# Patient Record
Sex: Female | Born: 1935 | Race: White | Hispanic: No | Marital: Married | Smoking: Never smoker
Health system: Southern US, Community
[De-identification: ages and names within clinical notes are randomized; demographics above are authoritative.]

## PROBLEM LIST (undated history)

## (undated) DIAGNOSIS — I272 Pulmonary hypertension, unspecified: Secondary | ICD-10-CM

## (undated) DIAGNOSIS — E079 Disorder of thyroid, unspecified: Secondary | ICD-10-CM

## (undated) DIAGNOSIS — M1990 Unspecified osteoarthritis, unspecified site: Secondary | ICD-10-CM

## (undated) DIAGNOSIS — M8580 Other specified disorders of bone density and structure, unspecified site: Secondary | ICD-10-CM

## (undated) DIAGNOSIS — I341 Nonrheumatic mitral (valve) prolapse: Secondary | ICD-10-CM

## (undated) DIAGNOSIS — D45 Polycythemia vera: Secondary | ICD-10-CM

## (undated) DIAGNOSIS — I8390 Asymptomatic varicose veins of unspecified lower extremity: Secondary | ICD-10-CM

## (undated) DIAGNOSIS — I10 Essential (primary) hypertension: Secondary | ICD-10-CM

## (undated) HISTORY — DX: Asymptomatic varicose veins of unspecified lower extremity: I83.90

## (undated) HISTORY — DX: Other specified disorders of bone density and structure, unspecified site: M85.80

## (undated) HISTORY — PX: BREAST CYST ASPIRATION: SHX578

## (undated) HISTORY — DX: Disorder of thyroid, unspecified: E07.9

## (undated) HISTORY — PX: EYE SURGERY: SHX253

## (undated) HISTORY — PX: TONSILLECTOMY: SUR1361

---

## 1998-06-24 ENCOUNTER — Other Ambulatory Visit: Admission: RE | Admit: 1998-06-24 | Discharge: 1998-06-24 | Payer: Self-pay | Admitting: Gynecology

## 1999-07-02 ENCOUNTER — Other Ambulatory Visit: Admission: RE | Admit: 1999-07-02 | Discharge: 1999-07-02 | Payer: Self-pay | Admitting: Gynecology

## 1999-12-23 ENCOUNTER — Encounter: Payer: Self-pay | Admitting: Gynecology

## 1999-12-23 ENCOUNTER — Encounter: Admission: RE | Admit: 1999-12-23 | Discharge: 1999-12-23 | Payer: Self-pay | Admitting: Gynecology

## 2000-07-02 ENCOUNTER — Other Ambulatory Visit: Admission: RE | Admit: 2000-07-02 | Discharge: 2000-07-02 | Payer: Self-pay | Admitting: Gynecology

## 2000-07-07 ENCOUNTER — Encounter: Admission: RE | Admit: 2000-07-07 | Discharge: 2000-07-07 | Payer: Self-pay | Admitting: Gynecology

## 2000-07-07 ENCOUNTER — Encounter: Payer: Self-pay | Admitting: Gynecology

## 2000-08-27 ENCOUNTER — Other Ambulatory Visit: Admission: RE | Admit: 2000-08-27 | Discharge: 2000-08-27 | Payer: Self-pay | Admitting: Gynecology

## 2001-01-26 ENCOUNTER — Encounter: Admission: RE | Admit: 2001-01-26 | Discharge: 2001-01-26 | Payer: Self-pay | Admitting: Gynecology

## 2001-01-26 ENCOUNTER — Encounter: Payer: Self-pay | Admitting: Gynecology

## 2001-08-30 ENCOUNTER — Other Ambulatory Visit: Admission: RE | Admit: 2001-08-30 | Discharge: 2001-08-30 | Payer: Self-pay | Admitting: Gynecology

## 2001-11-04 ENCOUNTER — Encounter (INDEPENDENT_AMBULATORY_CARE_PROVIDER_SITE_OTHER): Payer: Self-pay | Admitting: Specialist

## 2001-11-04 ENCOUNTER — Ambulatory Visit (HOSPITAL_COMMUNITY): Admission: RE | Admit: 2001-11-04 | Discharge: 2001-11-04 | Payer: Self-pay | Admitting: Gastroenterology

## 2002-01-30 ENCOUNTER — Encounter: Payer: Self-pay | Admitting: Gynecology

## 2002-01-30 ENCOUNTER — Encounter: Admission: RE | Admit: 2002-01-30 | Discharge: 2002-01-30 | Payer: Self-pay | Admitting: Gynecology

## 2002-09-07 ENCOUNTER — Other Ambulatory Visit: Admission: RE | Admit: 2002-09-07 | Discharge: 2002-09-07 | Payer: Self-pay | Admitting: Gynecology

## 2003-02-01 ENCOUNTER — Encounter: Admission: RE | Admit: 2003-02-01 | Discharge: 2003-02-01 | Payer: Self-pay | Admitting: Gynecology

## 2003-02-01 ENCOUNTER — Encounter: Payer: Self-pay | Admitting: Gynecology

## 2003-06-13 ENCOUNTER — Encounter: Admission: RE | Admit: 2003-06-13 | Discharge: 2003-06-13 | Payer: Self-pay | Admitting: Orthopedic Surgery

## 2003-07-02 ENCOUNTER — Encounter: Admission: RE | Admit: 2003-07-02 | Discharge: 2003-07-02 | Payer: Self-pay | Admitting: Gynecology

## 2004-02-11 ENCOUNTER — Encounter: Admission: RE | Admit: 2004-02-11 | Discharge: 2004-02-11 | Payer: Self-pay | Admitting: Gynecology

## 2004-09-10 ENCOUNTER — Other Ambulatory Visit: Admission: RE | Admit: 2004-09-10 | Discharge: 2004-09-10 | Payer: Self-pay | Admitting: Gynecology

## 2005-02-25 ENCOUNTER — Encounter: Admission: RE | Admit: 2005-02-25 | Discharge: 2005-02-25 | Payer: Self-pay | Admitting: Gynecology

## 2005-03-10 ENCOUNTER — Encounter: Admission: RE | Admit: 2005-03-10 | Discharge: 2005-03-10 | Payer: Self-pay | Admitting: Gynecology

## 2005-09-30 ENCOUNTER — Encounter: Admission: RE | Admit: 2005-09-30 | Discharge: 2005-09-30 | Payer: Self-pay | Admitting: Gynecology

## 2005-10-28 ENCOUNTER — Ambulatory Visit (HOSPITAL_COMMUNITY): Admission: RE | Admit: 2005-10-28 | Discharge: 2005-10-28 | Payer: Self-pay | Admitting: Ophthalmology

## 2006-03-01 ENCOUNTER — Encounter: Admission: RE | Admit: 2006-03-01 | Discharge: 2006-03-01 | Payer: Self-pay | Admitting: Gynecology

## 2006-09-23 ENCOUNTER — Other Ambulatory Visit: Admission: RE | Admit: 2006-09-23 | Discharge: 2006-09-23 | Payer: Self-pay | Admitting: Gynecology

## 2007-03-03 ENCOUNTER — Encounter: Admission: RE | Admit: 2007-03-03 | Discharge: 2007-03-03 | Payer: Self-pay | Admitting: Gynecology

## 2008-03-05 ENCOUNTER — Encounter: Admission: RE | Admit: 2008-03-05 | Discharge: 2008-03-05 | Payer: Self-pay | Admitting: Gynecology

## 2009-03-06 ENCOUNTER — Encounter: Admission: RE | Admit: 2009-03-06 | Discharge: 2009-03-06 | Payer: Self-pay | Admitting: Gynecology

## 2009-03-21 ENCOUNTER — Encounter: Admission: RE | Admit: 2009-03-21 | Discharge: 2009-03-21 | Payer: Self-pay | Admitting: Gynecology

## 2010-03-24 ENCOUNTER — Encounter: Admission: RE | Admit: 2010-03-24 | Discharge: 2010-03-24 | Payer: Self-pay | Admitting: Gynecology

## 2010-05-10 ENCOUNTER — Encounter: Payer: Self-pay | Admitting: Gynecology

## 2010-09-05 NOTE — Op Note (Signed)
[name], [name]               ACCOUNT NO.:  [id number]   MEDICAL RECORD NO.:  [id number]          PATIENT TYPE:  AMB   LOCATION:  SDS                          FACILITY:  [facility]   PHYSICIAN:  Jillyn Hidden A. Rankin, M.D.   DATE OF BIRTH:  [birth year]   DATE OF PROCEDURE:  DATE OF DISCHARGE:                                 OPERATIVE REPORT   PREOPERATIVE DIAGNOSES:  1.  Retained lens fragments, right eye.  2.  Aphakia, right eye.  3.  Microcystic corneal edema.   POSTOPERATIVE DIAGNOSES:  1.  Retained lens fragments, right eye.  2.  Aphakia, right eye.  3.  Microcystic corneal edema.  4.  Retinal hemorrhages, right eye, presumably from recent traumatic      complicated cataract surgery.   PROCEDURE:  1.  Posterior vitrectomy with panretinal photocoagulation, retinopexy 360      degrees, 3 rows, at and anterior to the equator.  2.  Insertion of anterior chamber intraocular lens--primary, rotated in      horizontal position.   LENS USED:  Alcon model [id number], power +18.0, serial [id number], length  12.5 mm, right eye.   SURGEON:  Fawn Kirk, M.D.   ANESTHESIA:  Local with retrobulbar.   INDICATIONS FOR PROCEDURE:  The patient is a 77 year old woman who has found  vision loss on the basis of residual aphakia after a recent complicated  cataract surgery, complicated by retained lens fragments, dispersion of lens  fragments, and rupture of the posterior capsule.  This is an attempt to  restore the pseudophakia condition, but also to remove the lens fragments.  The patient has persistent 1 week of microcystic corneal epithelial edema  and corneal edema.  The patient understands this is an attempt to clear the  vitreous debris, clear the anterior chamber, and place either a posterior or  an anterior chamber intraocular lens to restore visual acuity and function.  She understands the risks of anesthesia including loss of the eye, including  but not limited to hemorrhage, infection,  scarring, need for further  surgery, no change in vision, loss of vision, disease.  Appropriate signed  consent was obtained.   The patient was taken to the operating room.  In the operating room,  appropriate monitoring was followed by mild sedation.  Marcaine 0.75% 5 mL  retrobulbar and an additional 5 mL in the [employer] fashion.  The right  periocular region was sterilely prepped and draped in the usual sterile  fashion.  Lid speculum applied.  Conjunctival peritomy was fashioned  superiorly, and 25-guage trocars were used to place the infusion, and  subsequently the superior trocars were applied.  Core vitrectomy was then  begun.  Microcystic epithelial edema was apparent and hampered  visualization.  Central corneal abrasion was then created so as to allow for  central visualization and peripheral visualization of the entire retina.  This was carried out without difficulty.  Care was taken to confirm that the  posterior hyaloid had been removed.  The vitreous skirt was then trimmed 360  degrees.  Multiple findings of intraretinal hemorrhages, both flame and  dot  and blot, in the midperiphery were noted in the size suggestive of some  traumatic retinal vascular event from this perioperative complication from  the previous cataract surgery.   At this time, then laser photocoagulation was placed anterior to the greater  360 degrees for retinopexy purposes.  At this time, the incision was then  fashioned superiorly.  Under low infusion pressure, the anterior chamber was  deepened with Provisc.  The anterior chamber was then opened.  The  intraocular lens was then rotated, inserting the anterior chamber secured on  a  spur.  The white to white horizontally measured was 11.2 mm.  The 12.5-mm  length was selected.  Excellent fixation was obtained in the horizontal  position with the lens.  A peripheral iridectomy was surgically created  superiorly.  At this time, the anterior chamber was  closed with interrupted  10-0 nylon sutures.  The trocars were removed.  Subconjunctival injection of  Decadron applied.  Intraocular pressure had been assessed and found to be  adequate.  The wound had been found to be secure.  Subconjunctival injection  applied.  Fox shield applied.  The patient was taken to the short stay area.      Alford Highland Rankin, M.D.  Electronically Signed     GAR/MEDQ  D:  10/28/2005  T:  10/29/2005  Job:  [id number]   cc:   Ozzie Hoyle. Vonna Kotyk, MD  [address]  [place], [place] [address]   Allen Norris, M.D.  Fax: [phone]

## 2010-09-05 NOTE — Procedures (Signed)
[facility]  Patient:    [name], [name] Visit Number: [id number] MRN: [id number]          Service Type: END Location: ENDO Attending Physician:  Louie Bun Dictated by:   Everardo All Madilyn Fireman, M.D. Proc. Date: 11/04/01 Admit Date:  11/04/2001 Discharge Date: 11/04/2001   CC:         Gaspar Garbe, M.D.   Procedure Report  PROCEDURE:  Colonoscopy with polypectomy.  INDICATION FOR PROCEDURE:  Family history of colon cancer in a first degree relative.  DESCRIPTION OF PROCEDURE:  The patient was placed in the left lateral decubitus position then placed on the pulse monitor with continuous low flow oxygen delivered by nasal cannula. She was sedated with 70 mg IV Demerol and 6 mg IV Versed. The Olympus video colonoscope was inserted into the rectum and advanced to the cecum, confirmed by transillumination at McBurneys point and visualization of the ileocecal valve and appendiceal orifice. The prep was excellent. The cecum appeared normal. Within the ascending colon, there was an 8 mm sessile polyp which was fulgurated by hot biopsy. The remainder of the ascending colon appeared normal. In the transverse colon, there was a flat 1.2 cm sessile polyp removed by snare and sent in the same container. The remainder of the transverse, descending and sigmoid colon appeared normal with no further polyps, masses, diverticula or other mucosal abnormalities. Within the rectum, there was a 1 cm sessile polyp removed by snare. The scope was then withdrawn and the patient returned to the recovery room in stable condition. The patient tolerated the procedure well and there were no immediate complications.  IMPRESSION:  Ascending, transverse and rectal polyps.  PLAN:  Await histology to determine interval for future colon screening. Dictated by:   Everardo All Madilyn Fireman, M.D. Attending Physician:  Louie Bun DD:  11/04/01 TD:  11/09/01 Job:  [id number]

## 2010-11-10 ENCOUNTER — Other Ambulatory Visit: Payer: Self-pay | Admitting: Gynecology

## 2010-12-04 ENCOUNTER — Encounter: Payer: Self-pay | Admitting: Vascular Surgery

## 2010-12-16 ENCOUNTER — Encounter: Payer: Self-pay | Admitting: Vascular Surgery

## 2010-12-17 ENCOUNTER — Ambulatory Visit (INDEPENDENT_AMBULATORY_CARE_PROVIDER_SITE_OTHER): Payer: Medicare Other | Admitting: Vascular Surgery

## 2010-12-17 ENCOUNTER — Encounter: Payer: Self-pay | Admitting: Vascular Surgery

## 2010-12-17 VITALS — BP 170/71 | HR 75 | Resp 18 | Ht 63.5 in | Wt 109.0 lb

## 2010-12-17 DIAGNOSIS — I83893 Varicose veins of bilateral lower extremities with other complications: Secondary | ICD-10-CM

## 2010-12-17 NOTE — Progress Notes (Signed)
The patient presents today for evaluation of left leg venous hypertension. He has had many year history of progressive discomfort over this. Does have a long history of venous varicosities in her posterior calf but does not have pain over the. She does have some discomfort with prolonged standing mostly onto the dorsum of her feet.[name] He does have  some associated skin breakdown over the dorsum of her ankle. He has attempted where compression in the past but has not had the appropriate type garment. She denies any history of DVT.  Past Medical History  Diagnosis Date  . Varicose veins VENOUS INSUFFICIENCY SLOW HEALING OF WOUNDS [name] TO LOWER EXTEMETIES  . Osteopenia   . Venous stasis dermatitis   . Thyroid disease     History  Substance Use Topics  . Smoking status: Never Smoker   . Smokeless tobacco: Not on file  . Alcohol Use: No    Family History  Problem Relation Age of Onset  . Heart disease Father   . Cancer Son   . Cancer Maternal Aunt   . Cancer Paternal Uncle   . Cancer Maternal Grandfather     Not on File  Current outpatient prescriptions:aspirin 81 MG tablet, Take 81 mg by mouth daily.  , Disp: , Rfl: ;  calcium-vitamin D 250-100 MG-UNIT per tablet, Take 1 tablet by mouth 2 (two) times daily.  , Disp: , Rfl: ;  conjugated estrogens (PREMARIN) vaginal cream, Place vaginally as needed.  , Disp: , Rfl: ;  fish oil-omega-3 fatty acids 1000 MG capsule, Take 2 g by mouth daily.  , Disp: , Rfl:  hydrochlorothiazide (,MICROZIDE/HYDRODIURIL,) 12.5 MG capsule, Take 12.5 mg by mouth daily.  , Disp: , Rfl: ;  loratadine (CLARITIN) 10 MG tablet, Take 10 mg by mouth daily.  , Disp: , Rfl: ;  methimazole (TAPAZOLE) 5 MG tablet, Take 5 mg by mouth 3 (three) times daily.  , Disp: , Rfl: ;  Multiple Vitamin (MULTIVITAMIN) tablet, Take 1 tablet by mouth daily.  , Disp: , Rfl:  risedronate (ACTONEL) 35 MG tablet, Take 35 mg by mouth every 7 (seven) days. with water on empty stomach, nothing  by mouth or lie down for next 30 minutes. , Disp: , Rfl:   BP 170/71  Pulse 75  Resp 18  Ht 5' 3.5" (1.613 m)  Wt 109 lb (49.442 kg)  BMI 19.01 kg/m2  Body mass index is 19.01 kg/(m^2).   Review of systems*of arthritis. Cardiac palpitations. Osteoporosis. Review of systems otherwise negative  Physical exam: Well-developed well-nourished white female no acute distress. HEENT normal. Pulmonary respirations nonlabored cardiovascular 2+ radial 2+ popliteal pulses she has 1+ posterior tibial pulses bilaterally abdomen soft nontender no masses noted. Musculoskeletal: No deformities or cyanosis. Neurologic no focal weakness paresthesias. Skin: Some rash over the left pretibial area.  Sono site screening study revealed some enlargement of her left great saphenous vein. These did extend into the varicosities in her posterior calf.  Assessment: Left leg venous hypertension with varicose veins and [name] skin changes.  Plan: Patient is fitted with knee-high 20-30 mm mercury graduated compression stockings. I did explain treatment options should this fail. I feel that she will have adequate results with compression

## 2010-12-17 NOTE — Patient Instructions (Signed)
Measured for knee high compression stockings 20-30 mmHG.  [name] size medium.  Ankle 22 cm. Calf 34 cm.

## 2011-03-05 ENCOUNTER — Other Ambulatory Visit: Payer: Self-pay | Admitting: Gynecology

## 2011-03-05 DIAGNOSIS — Z1231 Encounter for screening mammogram for malignant neoplasm of breast: Secondary | ICD-10-CM

## 2011-03-31 ENCOUNTER — Ambulatory Visit
Admission: RE | Admit: 2011-03-31 | Discharge: 2011-03-31 | Disposition: A | Discharge status: Home / Self Care | Payer: Medicare Other | Source: Ambulatory Visit | Attending: Gynecology | Admitting: Gynecology

## 2011-03-31 DIAGNOSIS — Z1231 Encounter for screening mammogram for malignant neoplasm of breast: Secondary | ICD-10-CM

## 2012-02-29 ENCOUNTER — Other Ambulatory Visit: Payer: Self-pay | Admitting: Gynecology

## 2012-02-29 DIAGNOSIS — Z1231 Encounter for screening mammogram for malignant neoplasm of breast: Secondary | ICD-10-CM

## 2012-04-06 ENCOUNTER — Ambulatory Visit
Admission: RE | Admit: 2012-04-06 | Discharge: 2012-04-06 | Disposition: A | Discharge status: Home / Self Care | Payer: Medicare Other | Source: Ambulatory Visit | Attending: Gynecology | Admitting: Gynecology

## 2012-04-06 DIAGNOSIS — Z1231 Encounter for screening mammogram for malignant neoplasm of breast: Secondary | ICD-10-CM

## 2012-12-27 ENCOUNTER — Other Ambulatory Visit: Payer: Self-pay | Admitting: Gastroenterology

## 2012-12-27 DIAGNOSIS — Z9889 Other specified postprocedural states: Secondary | ICD-10-CM

## 2012-12-28 ENCOUNTER — Other Ambulatory Visit: Payer: Medicare Other

## 2013-01-30 ENCOUNTER — Other Ambulatory Visit: Payer: Medicare Other

## 2013-02-01 ENCOUNTER — Ambulatory Visit
Admission: RE | Admit: 2013-02-01 | Discharge: 2013-02-01 | Disposition: A | Discharge status: Home / Self Care | Payer: Medicare Other | Source: Ambulatory Visit | Attending: Gastroenterology | Admitting: Gastroenterology

## 2013-02-01 DIAGNOSIS — Z9889 Other specified postprocedural states: Secondary | ICD-10-CM

## 2013-03-23 ENCOUNTER — Other Ambulatory Visit: Payer: Self-pay

## 2013-03-23 DIAGNOSIS — Z1231 Encounter for screening mammogram for malignant neoplasm of breast: Secondary | ICD-10-CM

## 2013-05-01 ENCOUNTER — Ambulatory Visit
Admission: RE | Admit: 2013-05-01 | Discharge: 2013-05-01 | Disposition: A | Discharge status: Home / Self Care | Payer: Medicare Other | Source: Ambulatory Visit

## 2013-05-01 DIAGNOSIS — Z1231 Encounter for screening mammogram for malignant neoplasm of breast: Secondary | ICD-10-CM

## 2014-04-02 ENCOUNTER — Other Ambulatory Visit: Payer: Self-pay

## 2014-04-02 DIAGNOSIS — Z1231 Encounter for screening mammogram for malignant neoplasm of breast: Secondary | ICD-10-CM

## 2014-05-02 ENCOUNTER — Ambulatory Visit
Admission: RE | Admit: 2014-05-02 | Discharge: 2014-05-02 | Disposition: A | Discharge status: Home / Self Care | Payer: Medicare Other | Source: Ambulatory Visit

## 2014-05-02 DIAGNOSIS — Z1231 Encounter for screening mammogram for malignant neoplasm of breast: Secondary | ICD-10-CM

## 2015-04-18 ENCOUNTER — Other Ambulatory Visit: Payer: Self-pay

## 2015-04-18 DIAGNOSIS — Z1231 Encounter for screening mammogram for malignant neoplasm of breast: Secondary | ICD-10-CM

## 2015-05-08 ENCOUNTER — Ambulatory Visit
Admission: RE | Admit: 2015-05-08 | Discharge: 2015-05-08 | Disposition: A | Discharge status: Home / Self Care | Payer: Medicare Other | Source: Ambulatory Visit

## 2015-05-08 DIAGNOSIS — Z1231 Encounter for screening mammogram for malignant neoplasm of breast: Secondary | ICD-10-CM

## 2016-04-21 ENCOUNTER — Other Ambulatory Visit: Payer: Self-pay | Admitting: Internal Medicine

## 2016-04-21 DIAGNOSIS — Z1231 Encounter for screening mammogram for malignant neoplasm of breast: Secondary | ICD-10-CM

## 2016-05-08 ENCOUNTER — Ambulatory Visit
Admission: RE | Admit: 2016-05-08 | Discharge: 2016-05-08 | Disposition: A | Discharge status: Home / Self Care | Payer: Medicare Other | Source: Ambulatory Visit | Attending: Internal Medicine | Admitting: Internal Medicine

## 2016-05-08 DIAGNOSIS — Z1231 Encounter for screening mammogram for malignant neoplasm of breast: Secondary | ICD-10-CM

## 2017-04-26 ENCOUNTER — Other Ambulatory Visit: Payer: Self-pay | Admitting: Internal Medicine

## 2017-04-26 DIAGNOSIS — Z1231 Encounter for screening mammogram for malignant neoplasm of breast: Secondary | ICD-10-CM

## 2017-05-14 ENCOUNTER — Ambulatory Visit
Admission: RE | Admit: 2017-05-14 | Discharge: 2017-05-14 | Disposition: A | Discharge status: Home / Self Care | Payer: Medicare Other | Source: Ambulatory Visit | Attending: Internal Medicine | Admitting: Internal Medicine

## 2017-05-14 DIAGNOSIS — Z1231 Encounter for screening mammogram for malignant neoplasm of breast: Secondary | ICD-10-CM

## 2018-04-21 ENCOUNTER — Other Ambulatory Visit: Payer: Self-pay | Admitting: Internal Medicine

## 2018-04-21 DIAGNOSIS — Z1231 Encounter for screening mammogram for malignant neoplasm of breast: Secondary | ICD-10-CM

## 2018-05-18 ENCOUNTER — Ambulatory Visit
Admission: RE | Admit: 2018-05-18 | Discharge: 2018-05-18 | Disposition: A | Discharge status: Home / Self Care | Payer: Medicare Other | Source: Ambulatory Visit | Attending: Internal Medicine | Admitting: Internal Medicine

## 2018-05-18 DIAGNOSIS — Z1231 Encounter for screening mammogram for malignant neoplasm of breast: Secondary | ICD-10-CM

## 2019-11-03 DIAGNOSIS — N390 Urinary tract infection, site not specified: Secondary | ICD-10-CM | POA: Diagnosis not present

## 2019-11-03 DIAGNOSIS — N3090 Cystitis, unspecified without hematuria: Secondary | ICD-10-CM | POA: Diagnosis not present

## 2019-11-28 DIAGNOSIS — E0520 Thyrotoxicosis with toxic multinodular goiter without thyrotoxic crisis or storm: Secondary | ICD-10-CM | POA: Diagnosis not present

## 2019-11-28 DIAGNOSIS — I129 Hypertensive chronic kidney disease with stage 1 through stage 4 chronic kidney disease, or unspecified chronic kidney disease: Secondary | ICD-10-CM | POA: Diagnosis not present

## 2019-11-28 DIAGNOSIS — M810 Age-related osteoporosis without current pathological fracture: Secondary | ICD-10-CM | POA: Diagnosis not present

## 2019-11-28 DIAGNOSIS — Z0000 Encounter for general adult medical examination without abnormal findings: Secondary | ICD-10-CM | POA: Diagnosis not present

## 2019-12-05 DIAGNOSIS — N1831 Chronic kidney disease, stage 3a: Secondary | ICD-10-CM | POA: Diagnosis not present

## 2019-12-05 DIAGNOSIS — I341 Nonrheumatic mitral (valve) prolapse: Secondary | ICD-10-CM | POA: Diagnosis not present

## 2019-12-05 DIAGNOSIS — I129 Hypertensive chronic kidney disease with stage 1 through stage 4 chronic kidney disease, or unspecified chronic kidney disease: Secondary | ICD-10-CM | POA: Diagnosis not present

## 2019-12-05 DIAGNOSIS — Z1212 Encounter for screening for malignant neoplasm of rectum: Secondary | ICD-10-CM | POA: Diagnosis not present

## 2019-12-05 DIAGNOSIS — F4310 Post-traumatic stress disorder, unspecified: Secondary | ICD-10-CM | POA: Diagnosis not present

## 2019-12-05 DIAGNOSIS — Z0000 Encounter for general adult medical examination without abnormal findings: Secondary | ICD-10-CM | POA: Diagnosis not present

## 2019-12-05 DIAGNOSIS — I272 Pulmonary hypertension, unspecified: Secondary | ICD-10-CM | POA: Diagnosis not present

## 2019-12-05 DIAGNOSIS — J449 Chronic obstructive pulmonary disease, unspecified: Secondary | ICD-10-CM | POA: Diagnosis not present

## 2019-12-05 DIAGNOSIS — E0520 Thyrotoxicosis with toxic multinodular goiter without thyrotoxic crisis or storm: Secondary | ICD-10-CM | POA: Diagnosis not present

## 2019-12-05 DIAGNOSIS — I517 Cardiomegaly: Secondary | ICD-10-CM | POA: Diagnosis not present

## 2019-12-15 DIAGNOSIS — H5212 Myopia, left eye: Secondary | ICD-10-CM | POA: Diagnosis not present

## 2019-12-15 DIAGNOSIS — H52223 Regular astigmatism, bilateral: Secondary | ICD-10-CM | POA: Diagnosis not present

## 2019-12-15 DIAGNOSIS — H1820 Unspecified corneal edema: Secondary | ICD-10-CM | POA: Diagnosis not present

## 2019-12-15 DIAGNOSIS — H18231 Secondary corneal edema, right eye: Secondary | ICD-10-CM | POA: Diagnosis not present

## 2019-12-15 DIAGNOSIS — H524 Presbyopia: Secondary | ICD-10-CM | POA: Diagnosis not present

## 2019-12-15 DIAGNOSIS — H1711 Central corneal opacity, right eye: Secondary | ICD-10-CM | POA: Diagnosis not present

## 2019-12-18 DIAGNOSIS — H52223 Regular astigmatism, bilateral: Secondary | ICD-10-CM | POA: Diagnosis not present

## 2019-12-18 DIAGNOSIS — H16321 Diffuse interstitial keratitis, right eye: Secondary | ICD-10-CM | POA: Diagnosis not present

## 2019-12-18 DIAGNOSIS — H524 Presbyopia: Secondary | ICD-10-CM | POA: Diagnosis not present

## 2019-12-18 DIAGNOSIS — H5212 Myopia, left eye: Secondary | ICD-10-CM | POA: Diagnosis not present

## 2019-12-26 DIAGNOSIS — H1811 Bullous keratopathy, right eye: Secondary | ICD-10-CM | POA: Diagnosis not present

## 2020-01-30 DIAGNOSIS — H5340 Unspecified visual field defects: Secondary | ICD-10-CM | POA: Diagnosis not present

## 2020-01-30 DIAGNOSIS — H40013 Open angle with borderline findings, low risk, bilateral: Secondary | ICD-10-CM | POA: Diagnosis not present

## 2020-01-30 DIAGNOSIS — H401122 Primary open-angle glaucoma, left eye, moderate stage: Secondary | ICD-10-CM | POA: Diagnosis not present

## 2020-01-30 DIAGNOSIS — H52223 Regular astigmatism, bilateral: Secondary | ICD-10-CM | POA: Diagnosis not present

## 2020-01-30 DIAGNOSIS — H40022 Open angle with borderline findings, high risk, left eye: Secondary | ICD-10-CM | POA: Diagnosis not present

## 2020-01-30 DIAGNOSIS — H5212 Myopia, left eye: Secondary | ICD-10-CM | POA: Diagnosis not present

## 2020-01-30 DIAGNOSIS — H524 Presbyopia: Secondary | ICD-10-CM | POA: Diagnosis not present

## 2020-04-14 IMAGING — MG DIGITAL SCREENING BILATERAL MAMMOGRAM WITH CAD
4 series · 4 of 4 positions shown · non-contrast
Comparison: Previous exam(s).

CLINICAL DATA: Screening.

EXAM:
DIGITAL SCREENING BILATERAL MAMMOGRAM WITH CAD

[R MLO]
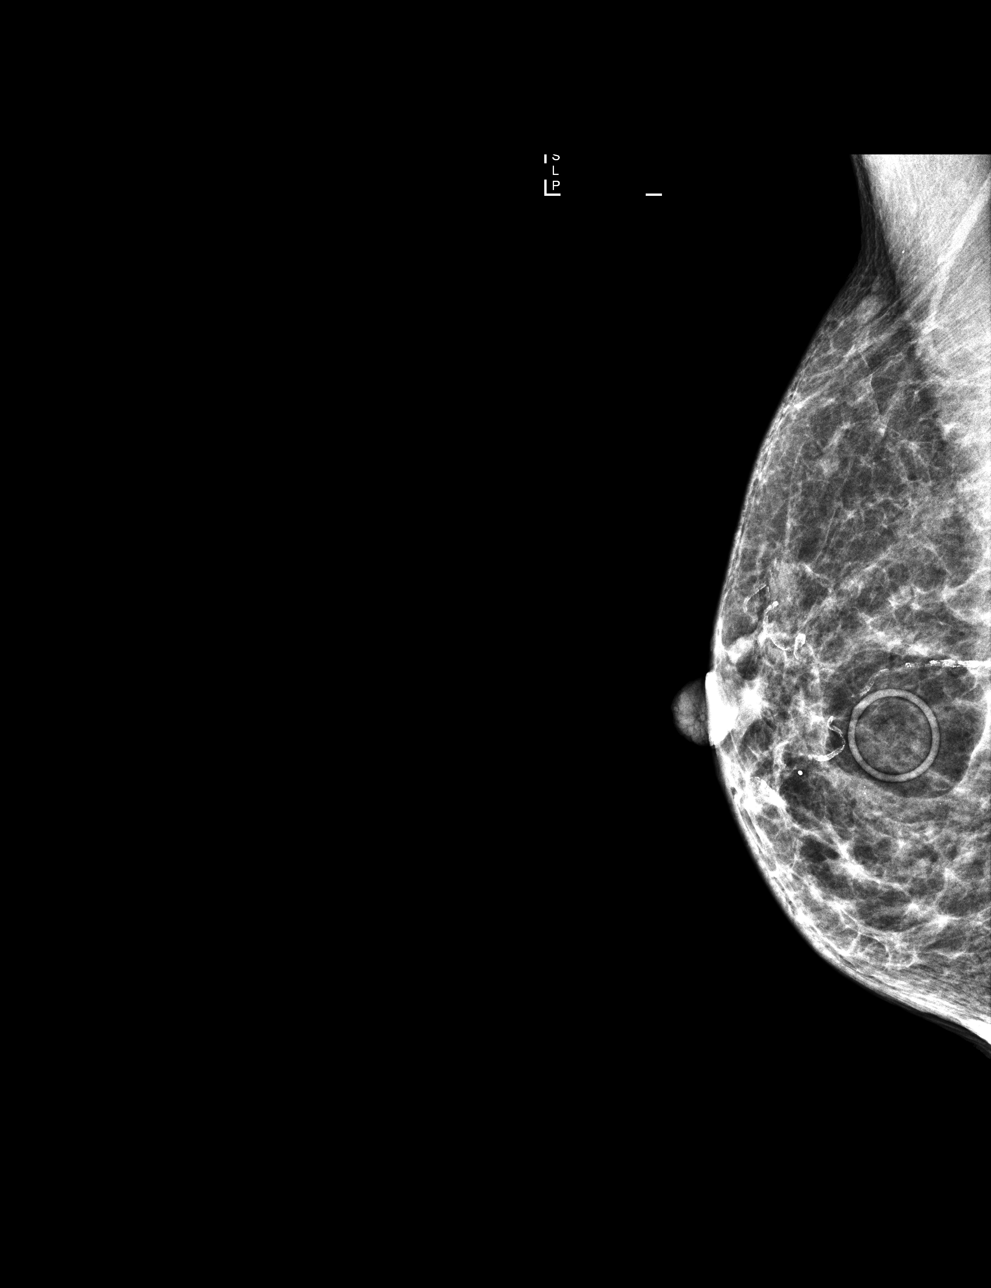

[R CC]
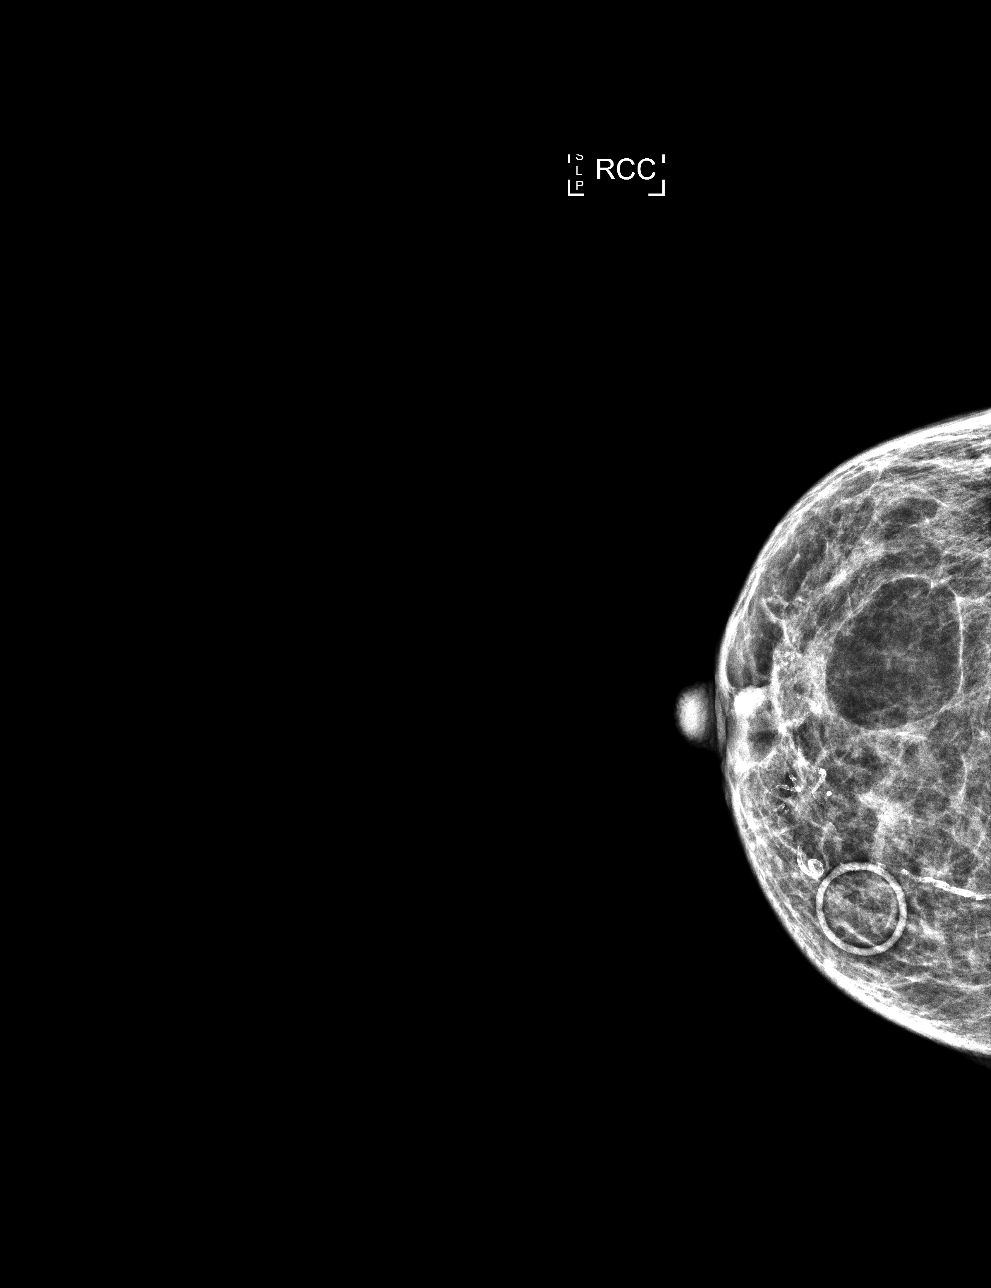

[L MLO]
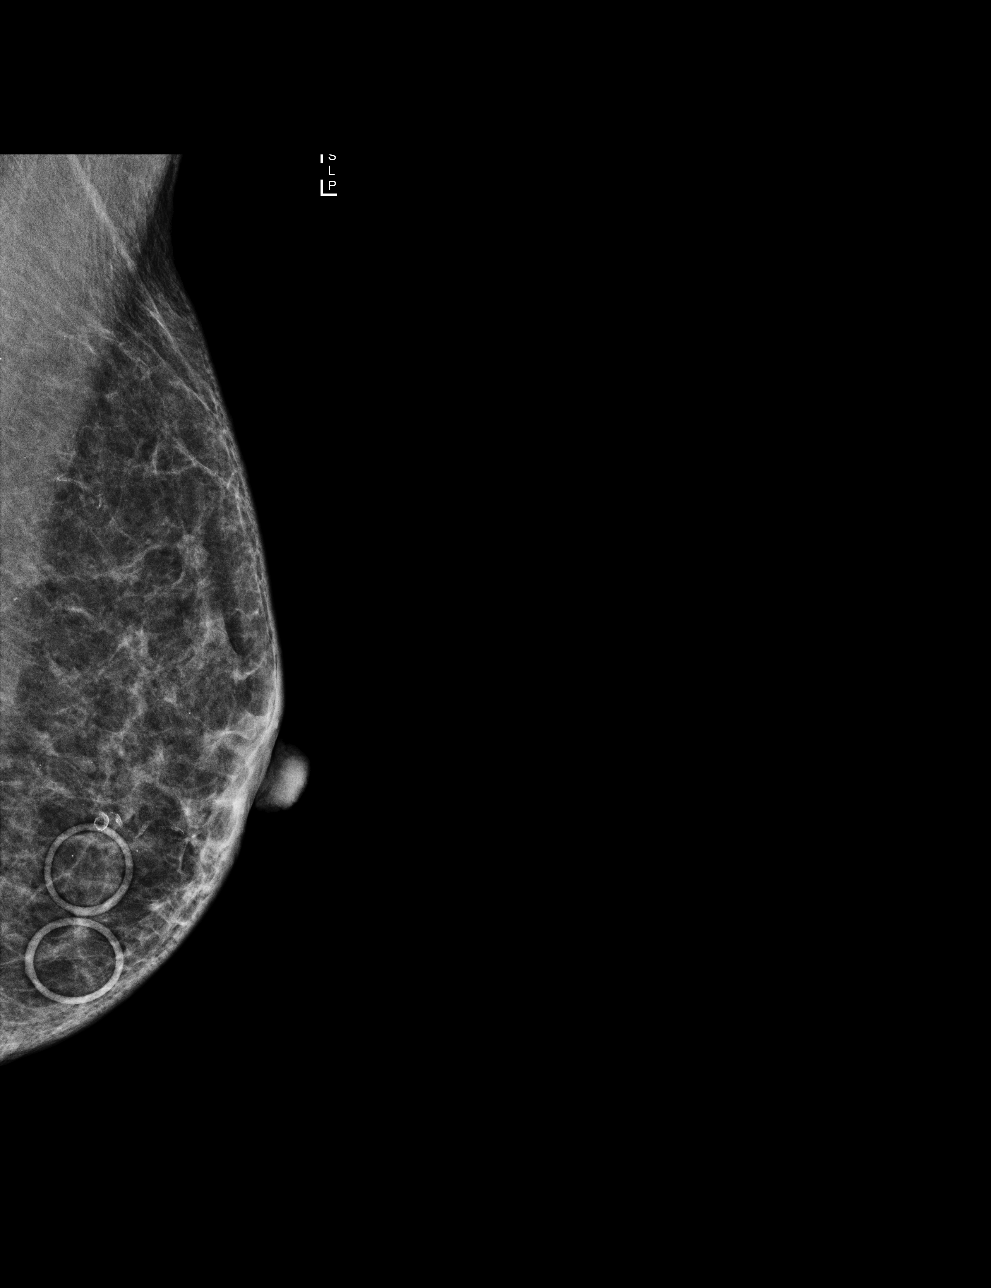

[L CC]
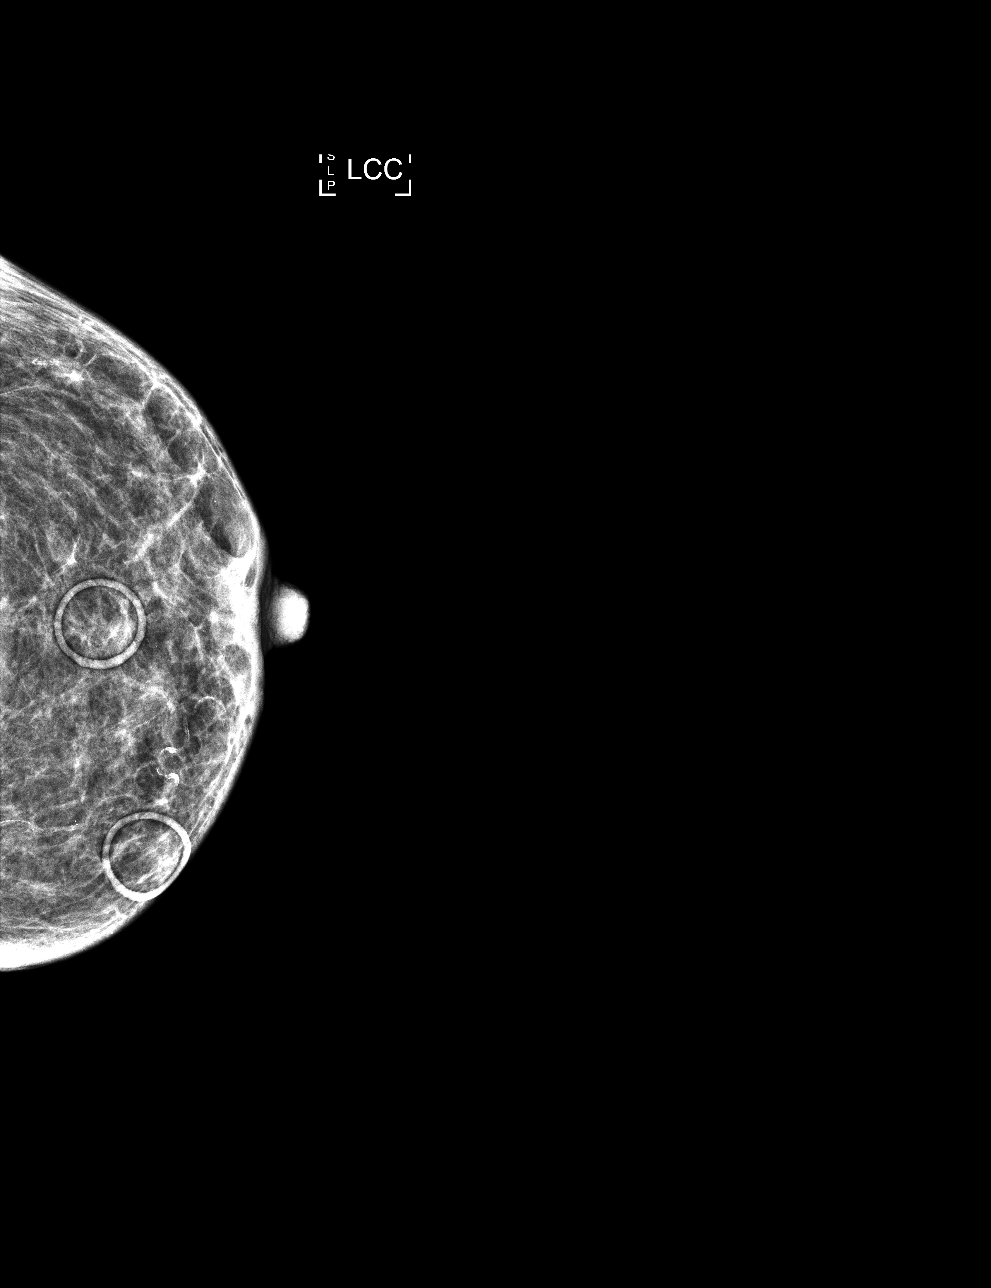

[4 of 4 positions shown; findings below may reference images not displayed]

ACR Breast Density Category b: There are scattered areas of
fibroglandular density.
FINDINGS: There are no findings suspicious for malignancy. Images were
processed with CAD.
IMPRESSION: No mammographic evidence of malignancy. A result letter of this
screening mammogram will be mailed directly to the patient.

RECOMMENDATION:
Screening mammogram in one year. (Code:AS-G-LCT)

BI-RADS CATEGORY  1: Negative.

## 2020-05-14 DIAGNOSIS — L720 Epidermal cyst: Secondary | ICD-10-CM | POA: Diagnosis not present

## 2020-05-30 DIAGNOSIS — H52223 Regular astigmatism, bilateral: Secondary | ICD-10-CM | POA: Diagnosis not present

## 2020-05-30 DIAGNOSIS — H401131 Primary open-angle glaucoma, bilateral, mild stage: Secondary | ICD-10-CM | POA: Diagnosis not present

## 2020-05-30 DIAGNOSIS — H524 Presbyopia: Secondary | ICD-10-CM | POA: Diagnosis not present

## 2020-05-30 DIAGNOSIS — H5212 Myopia, left eye: Secondary | ICD-10-CM | POA: Diagnosis not present

## 2020-06-12 DIAGNOSIS — N1831 Chronic kidney disease, stage 3a: Secondary | ICD-10-CM | POA: Diagnosis not present

## 2020-06-12 DIAGNOSIS — J449 Chronic obstructive pulmonary disease, unspecified: Secondary | ICD-10-CM | POA: Diagnosis not present

## 2020-06-12 DIAGNOSIS — E0520 Thyrotoxicosis with toxic multinodular goiter without thyrotoxic crisis or storm: Secondary | ICD-10-CM | POA: Diagnosis not present

## 2020-06-12 DIAGNOSIS — J302 Other seasonal allergic rhinitis: Secondary | ICD-10-CM | POA: Diagnosis not present

## 2020-06-12 DIAGNOSIS — I272 Pulmonary hypertension, unspecified: Secondary | ICD-10-CM | POA: Diagnosis not present

## 2020-06-12 DIAGNOSIS — H9190 Unspecified hearing loss, unspecified ear: Secondary | ICD-10-CM | POA: Diagnosis not present

## 2020-06-12 DIAGNOSIS — I341 Nonrheumatic mitral (valve) prolapse: Secondary | ICD-10-CM | POA: Diagnosis not present

## 2020-06-12 DIAGNOSIS — I129 Hypertensive chronic kidney disease with stage 1 through stage 4 chronic kidney disease, or unspecified chronic kidney disease: Secondary | ICD-10-CM | POA: Diagnosis not present

## 2020-06-12 DIAGNOSIS — D692 Other nonthrombocytopenic purpura: Secondary | ICD-10-CM | POA: Diagnosis not present

## 2020-06-13 ENCOUNTER — Telehealth: Payer: Medicare Other | Admitting: Hematology

## 2020-06-13 NOTE — Telephone Encounter (Signed)
Received a new hem referral from Dr. Osborne Casco for elevated platelet count. [name] has been cld and scheduled to see Dr. Irene Limbo on 3/7 at 11am.
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
## 2020-06-24 ENCOUNTER — Other Ambulatory Visit: Payer: Self-pay

## 2020-06-24 ENCOUNTER — Inpatient Hospital Stay: Payer: Medicare PPO | Attending: Hematology | Admitting: Hematology

## 2020-06-24 ENCOUNTER — Inpatient Hospital Stay: Payer: Medicare PPO

## 2020-06-24 ENCOUNTER — Telehealth: Payer: Self-pay | Admitting: Hematology

## 2020-06-24 ENCOUNTER — Telehealth: Payer: Self-pay

## 2020-06-24 VITALS — BP 206/87 | HR 67 | Temp 97.7°F | Resp 17 | Ht 63.5 in | Wt 100.1 lb

## 2020-06-24 DIAGNOSIS — M8580 Other specified disorders of bone density and structure, unspecified site: Secondary | ICD-10-CM | POA: Insufficient documentation

## 2020-06-24 DIAGNOSIS — D45 Polycythemia vera: Secondary | ICD-10-CM | POA: Diagnosis not present

## 2020-06-24 DIAGNOSIS — D75839 Thrombocytosis, unspecified: Secondary | ICD-10-CM | POA: Insufficient documentation

## 2020-06-24 DIAGNOSIS — E079 Disorder of thyroid, unspecified: Secondary | ICD-10-CM | POA: Diagnosis not present

## 2020-06-24 LAB — CBC WITH DIFFERENTIAL/PLATELET
Abs Immature Granulocytes: 0.03 10*3/uL (ref 0.00–0.07)
Basophils Absolute: 0.1 10*3/uL (ref 0.0–0.1)
Basophils Relative: 1 %
Eosinophils Absolute: 0.4 10*3/uL (ref 0.0–0.5)
Eosinophils Relative: 4 %
HCT: 50.5 % — ABNORMAL HIGH (ref 36.0–46.0)
Hemoglobin: 16.2 g/dL — ABNORMAL HIGH (ref 12.0–15.0)
Immature Granulocytes: 0 %
Lymphocytes Relative: 14 %
Lymphs Abs: 1.3 10*3/uL (ref 0.7–4.0)
MCH: 31.6 pg (ref 26.0–34.0)
MCHC: 32.1 g/dL (ref 30.0–36.0)
MCV: 98.6 fL (ref 80.0–100.0)
Monocytes Absolute: 0.8 10*3/uL (ref 0.1–1.0)
Monocytes Relative: 8 %
Neutro Abs: 6.5 10*3/uL (ref 1.7–7.7)
Neutrophils Relative %: 73 %
Platelets: 787 10*3/uL — ABNORMAL HIGH (ref 150–400)
RBC: 5.12 MIL/uL — ABNORMAL HIGH (ref 3.87–5.11)
RDW: 14.6 % (ref 11.5–15.5)
WBC: 9 10*3/uL (ref 4.0–10.5)
nRBC: 0 % (ref 0.0–0.2)

## 2020-06-24 LAB — CMP (CANCER CENTER ONLY)
ALT: 25 U/L (ref 0–44)
AST: 32 U/L (ref 15–41)
Albumin: 4.3 g/dL (ref 3.5–5.0)
Alkaline Phosphatase: 77 U/L (ref 38–126)
Anion gap: 7 (ref 5–15)
BUN: 23 mg/dL (ref 8–23)
CO2: 34 mmol/L — ABNORMAL HIGH (ref 22–32)
Calcium: 10 mg/dL (ref 8.9–10.3)
Chloride: 102 mmol/L (ref 98–111)
Creatinine: 1.04 mg/dL — ABNORMAL HIGH (ref 0.44–1.00)
GFR, Estimated: 53 mL/min — ABNORMAL LOW (ref >60)
Glucose, Bld: 86 mg/dL (ref 70–99)
Potassium: 3.6 mmol/L (ref 3.5–5.1)
Sodium: 143 mmol/L (ref 135–145)
Total Bilirubin: 0.7 mg/dL (ref 0.3–1.2)
Total Protein: 7.4 g/dL (ref 6.5–8.1)

## 2020-06-24 LAB — LACTATE DEHYDROGENASE: LDH: 259 U/L — ABNORMAL HIGH (ref 98–192)

## 2020-06-24 LAB — IMMATURE PLATELET FRACTION: Immature Platelet Fraction: 5.1 % (ref 1.2–8.6)

## 2020-06-24 NOTE — Telephone Encounter (Signed)
Scheduled appointment per 3/7 los. Spoke to patient who is aware of appointment date and time.

## 2020-06-24 NOTE — Telephone Encounter (Signed)
[facility] regarding scanned new patient referral for 06/14/20 to be removed as this is not for patient. [facility]S stated a ticket will be opened for IT to remove as they do not do that.

## 2020-06-25 DIAGNOSIS — R002 Palpitations: Secondary | ICD-10-CM | POA: Insufficient documentation

## 2020-06-25 NOTE — Progress Notes (Signed)
Patient referred by Haywood Pao, MD for leg edema  Subjective:   [name], female    DOB: Aug 23, 1932, 87 y.o.   MRN: [id number]   Chief Complaint  Patient presents with  . Hypertension  . Palpitations  . Edema  . New Patient (Initial Visit)    HPI  87 y.o. Caucasian female with hypertension, CKD 3a, COPD, pulmonary hypertension, multinodular goiter w/h/o thyrotoxicosis, h/o mitral valve prolapse, referred for evaluation of palpitations  Patient reports episodes of palpitations, mostly at night. Episodes have increase din frequency. Blood pressure is elevated today, was noted to be 152/80 mmHg at recent PCP visit. She denies chest pain, shortness of breath, palpitations, orthopnea, PND, TIA/syncope. She reports leg edema. She tells me she is known to have PAD, her feet stay cold, but denies any claudication symptoms.  She is seeing hematologist Dr. Irene Limbo for thrombocytosis, has upcoming blood work pending to differentiate between clonal and reactive causes.    Past Medical History:  Diagnosis Date  . Osteopenia   . Thyroid disease   . Varicose veins VENOUS INSUFFICIENCY SLOW HEALING OF WOUNDS Charissa Bash TO LOWER EXTEMETIES  . Venous stasis dermatitis      Past Surgical History:  Procedure Laterality Date  . BREAST CYST ASPIRATION Left   . EYE SURGERY    . TONSILLECTOMY       Social History   Tobacco Use  Smoking Status Never Smoker  Smokeless Tobacco Not on file    Social History   Substance and Sexual Activity  Alcohol Use No     Family History  Problem Relation Age of Onset  . Heart disease Father   . Cancer Son   . Cancer Maternal Aunt   . Cancer Paternal Uncle   . Cancer Maternal Grandfather   . Breast cancer Neg Hx      Current Outpatient Medications on File Prior to Visit  Medication Sig Dispense Refill  . aspirin 81 MG tablet Take 81 mg by mouth daily.    . calcium-vitamin D 250-100 MG-UNIT per tablet Take 1 tablet by mouth 2  (two) times daily.    Marland Kitchen diltiazem (DILACOR XR) 180 MG 24 hr capsule Take 180 mg by mouth daily.    . furosemide (LASIX) 20 MG tablet Take 20 mg by mouth 2 (two) times daily.    Marland Kitchen loratadine (CLARITIN) 10 MG tablet Take 10 mg by mouth daily.    . methimazole (TAPAZOLE) 5 MG tablet Take 5 mg by mouth 3 (three) times daily.    . psyllium (METAMUCIL) 58.6 % packet Take 1 packet by mouth daily.    . timolol (TIMOPTIC) 0.5 % ophthalmic solution Place 1 drop into both eyes daily.     No current facility-administered medications on file prior to visit.    Cardiovascular and other pertinent studies:  EKG 06/26/2020: Sinus rhythm 69 bpm Left atrial enlargement.  Old anteroseptal infarct Probable LVH   Recent labs: 06/12/2020: Glucose 68, BUN/Cr 21/1.0. EGFR 52. Na/K 142/4.3. Rest of the CMP normal H/H 16/49. MCV 96. Platelets 701 HbA1C N/A Lipid panel N/A TSH 4.6 high. Free T4 0.9 normal NT pro BNP 571 normal    Review of Systems  Cardiovascular: Positive for leg swelling. Negative for chest pain, dyspnea on exertion, palpitations and syncope.         Vitals:   06/26/20 0830 06/26/20 0840  BP: (!) 201/77 (!) 190/79  Pulse: 65 67  Resp: 16   Temp: 99 F (  37.2 C)   SpO2: 95%      Body mass index is 19.53 kg/m. Filed Weights   06/26/20 0830  Weight: 100 lb (45.4 kg)     Objective:   Physical Exam Vitals and nursing note reviewed.  Constitutional:      General: She is not in acute distress. Neck:     Vascular: No JVD.  Cardiovascular:     Rate and Rhythm: Normal rate and regular rhythm.     Pulses:          Dorsalis pedis pulses are 1+ on the right side and 1+ on the left side.       Posterior tibial pulses are 0 on the right side and 0 on the left side.     Heart sounds: Murmur heard.  High-pitched blowing holosystolic murmur is present with a grade of 2/6 at the apex.     Comments: Dusky appearance of bilateral feet, good capillary refill Pulmonary:      Effort: Pulmonary effort is normal.     Breath sounds: Normal breath sounds. No wheezing or rales.  Musculoskeletal:     Right lower leg: Edema (Trace) present.     Left lower leg: Edema (Trace) present.         Assessment & Recommendations:   87 y.o. Caucasian female with hypertension, CKD 3a, COPD, pulmonary hypertension, multinodular goiter w/h/o thyrotoxicosis, referred for evaluation of palpitations  Palpitations:  H/o mitral valve prolapse Recommend cardiac telemetry to evaluate for tachyarrhtymia  Leg edema:  Mild. Suspect it could be related to hypertension. Start losartan 50 mg daily. Check BMP, lipid panel in 2 weeks Nonetheless, given h/o mitral valve prolapse will check echocardiogram.   PAD: Abnormal vascular exam, likely disease SFA and below. No claudication symptoms at this time. Continue risk factor modification  Thank you for referring the patient to [place]. Please feel free to contact with any questions.   Nigel Mormon, MD Pager: [phone] Office: [phone]

## 2020-06-26 ENCOUNTER — Encounter: Payer: Self-pay | Admitting: Cardiology

## 2020-06-26 ENCOUNTER — Other Ambulatory Visit: Payer: Self-pay

## 2020-06-26 ENCOUNTER — Inpatient Hospital Stay: Payer: Medicare PPO

## 2020-06-26 ENCOUNTER — Ambulatory Visit: Payer: Medicare PPO | Admitting: Cardiology

## 2020-06-26 VITALS — BP 190/79 | HR 67 | Temp 99.0°F | Resp 16 | Ht 60.0 in | Wt 100.0 lb

## 2020-06-26 DIAGNOSIS — R002 Palpitations: Secondary | ICD-10-CM | POA: Diagnosis not present

## 2020-06-26 DIAGNOSIS — R0989 Other specified symptoms and signs involving the circulatory and respiratory systems: Secondary | ICD-10-CM | POA: Diagnosis not present

## 2020-06-26 DIAGNOSIS — I10 Essential (primary) hypertension: Secondary | ICD-10-CM | POA: Insufficient documentation

## 2020-06-26 MED ORDER — LOSARTAN POTASSIUM 50 MG PO TABS: 50.0000 mg | ORAL_TABLET | Freq: Every day | ORAL | 3 refills | Status: DC

## 2020-06-28 ENCOUNTER — Telehealth: Payer: Self-pay | Admitting: Hematology

## 2020-06-28 NOTE — Telephone Encounter (Signed)
Scheduled per 03/07 los, patient has been called and notified of upcoming phone visit.

## 2020-07-01 LAB — JAK2 (INCLUDING V617F AND EXON 12), MPL,& CALR-NEXT GEN SEQ

## 2020-07-03 DIAGNOSIS — R0989 Other specified symptoms and signs involving the circulatory and respiratory systems: Secondary | ICD-10-CM | POA: Diagnosis not present

## 2020-07-04 LAB — LIPID PANEL
Chol/HDL Ratio: 2 ratio (ref 0.0–4.4)
Cholesterol, Total: 149 mg/dL (ref 100–199)
HDL: 74 mg/dL (ref >39)
LDL Chol Calc (NIH): 63 mg/dL (ref 0–99)
Triglycerides: 60 mg/dL (ref 0–149)
VLDL Cholesterol Cal: 12 mg/dL (ref 5–40)

## 2020-07-07 NOTE — Progress Notes (Signed)
HEMATOLOGY/ONCOLOGY CONSULTATION NOTE  Date of Service: 07/08/2020  Patient Care Team: [name], Fransico Him, MD as PCP - General (Internal Medicine)  CHIEF COMPLAINTS/PURPOSE OF CONSULTATION:  Thrombocytosis  HISTORY OF PRESENTING ILLNESS:   [name] is a wonderful 87 y.o. female who has been referred to [place] by Dr. Domenick Gong, MD for evaluation and management of elevated Plt count. The pt reports that she is doing well overall. We are joined today by her husband, [name].  The pt had labwork, 06/12/2020 of CBC had a Plt of 701K.   The pt reports that this is the first time she has noticed her elevated Plt count. She notes her Plt have been increasing since 2018, but were normal. She notes they were slightly elevated in 2020 and have been the highest before when she got labs done recently last month. The pt notes that she did experience a severe injury and bruising one year ago in an accident with a wood splitter. There has not been any other recent injuries or infections. The pt denies any recent reasons for bleeding. The pt notes post-nasal drip, a minor cough, and a toxic goiter. She notes she has been the healthiest and had the least infection issues since COVID and the use of masks. The pt notes there were no blood clots with her injury from an [place] on 07/06/2019, and the bruising and swelling has improved on surface level. She notes intermittent sting an pain in the leg.  The pt denies any history of smoking. She also denies any new symptoms that are acute. The pt notes she has not been feeling her old baseline, but notes no issues acutely or unexplained changes in how she has felt. The pt is eating well. The pt denies any history of blood clots, heart attacks, strokes. The pt notes she has an appointment with a Cardiologist this week in two days. She notes a weak beat and some irregular heartbeats. She has been more aware of her heartbeats recently and has been swelling in her feet  more. The pt notes she also has Pulmonary Hypertension, but was never aware of the cause of this. The pt still has her spleen. The pt notes some new crippling in her left hand and continued in her right. The pt notes some hip pain and denies any history of gout. The pt notes her last thyroid levels were checked on 06/12/2020 with other blood counts and there were no concerns.   On review of systems, pt reports leg swelling and denies fevers, chills, sudden unexpected weight loss, SOB, chest pain, back pain, changes in bowel habits, and any other symptoms.   INTERVAL HISTORY  I connected with [employer] on 07/08/2020 by telephone and verified that I am speaking with the correct person using two identifiers.   I discussed the limitations of evaluation and management by telemedicine. The patient expressed understanding and agreed to proceed.   Other persons participating in the visit and their role in the encounter:                                                         - Reinaldo Raddle, Medical Scribe            - Mr. Setterlund, Pt's Husband     Patient's location:  Home Provider's location: [name] is a wonderful 87 y.o. female who is here today for evaluation and management of thrombocytosis. The patient's last visit with [place] was on 06/24/2020. The pt reports that she is doing well overall.  The pt reports no new symptoms or concerns. The pt denies any history of blood clots or DVT. The pt notes that she has started on Losartan by her Cardiologist.   Lab results 06/24/2020 of CBC w/diff and CMP is as follows: all values are WNL except for RBC of 5.12, Hgb of 16.2, HCT of 50.5, Plt of 787K, CO2 of 34, Creatinine of 1.04, GFR est of 53. 06/24/2020 Immature Plt Fract of 5.1. 06/24/2020 LDH of 259. 06/24/2020 JAK2 mutation detected.  On review of systems, pt denies any acute symptoms.  MEDICAL HISTORY:  Past Medical History:  Diagnosis Date  . Osteopenia   .  Thyroid disease   . Varicose veins VENOUS INSUFFICIENCY SLOW HEALING OF WOUNDS Charissa Bash TO LOWER EXTEMETIES  . Venous stasis dermatitis     SURGICAL HISTORY: Past Surgical History:  Procedure Laterality Date  . BREAST CYST ASPIRATION Left   . EYE SURGERY    . TONSILLECTOMY      SOCIAL HISTORY: Social History   Socioeconomic History  . Marital status: Married    Spouse name: Not on file  . Number of children: 2  . Years of education: Not on file  . Highest education level: Not on file  Occupational History  . Not on file  Tobacco Use  . Smoking status: Never Smoker  . Smokeless tobacco: Never Used  Vaping Use  . Vaping Use: Never used  Substance and Sexual Activity  . Alcohol use: No  . Drug use: Never  . Sexual activity: Not on file  Other Topics Concern  . Not on file  Social History Narrative  . Not on file   Social Determinants of Health   Financial Resource Strain: Not on file  Food Insecurity: Not on file  Transportation Needs: Not on file  Physical Activity: Not on file  Stress: Not on file  Social Connections: Not on file  Intimate Partner Violence: Not on file    FAMILY HISTORY: Family History  Problem Relation Age of Onset  . Heart disease Father   . Cancer Son   . Cancer Maternal Aunt   . Cancer Paternal Uncle   . Cancer Maternal Grandfather   . Breast cancer Neg Hx     ALLERGIES:  is allergic to doxycycline calcium and penicillin v.  MEDICATIONS:  Current Outpatient Medications  Medication Sig Dispense Refill  . hydroxyurea (HYDREA) 500 MG capsule Take 1 capsule (500 mg total) by mouth daily. May take with food to minimize GI side effects. 30 capsule 1  . aspirin 81 MG tablet Take 81 mg by mouth daily.    . calcium-vitamin D 250-100 MG-UNIT per tablet Take 1 tablet by mouth 2 (two) times daily.    Marland Kitchen diltiazem (DILACOR XR) 180 MG 24 hr capsule Take 180 mg by mouth daily.    . furosemide (LASIX) 20 MG tablet Take 20 mg by mouth 2 (two)  times daily.    Marland Kitchen loratadine (CLARITIN) 10 MG tablet Take 10 mg by mouth daily.    Marland Kitchen losartan (COZAAR) 50 MG tablet Take 1 tablet (50 mg total) by mouth daily. 30 tablet 3  . methimazole (TAPAZOLE) 5 MG tablet Take 5 mg by mouth 3 (three) times daily.    Marland Kitchen  psyllium (METAMUCIL) 58.6 % packet Take 1 packet by mouth daily.    . timolol (TIMOPTIC) 0.5 % ophthalmic solution Place 1 drop into both eyes daily.     No current facility-administered medications for this visit.    REVIEW OF SYSTEMS:   10 Point review of Systems was done is negative except as noted above.  PHYSICAL EXAMINATION: ECOG PERFORMANCE STATUS: 1 - Symptomatic but completely ambulatory  . There were no vitals filed for this visit. There were no vitals filed for this visit. .There is no height or weight on file to calculate BMI.  Telehealth Visit.  LABORATORY DATA:  I have reviewed the data as listed  . CBC Latest Ref Rng & Units 06/24/2020  WBC 4.0 - 10.5 K/uL 9.0  Hemoglobin 12.0 - 15.0 g/dL 16.2(H)  Hematocrit 36.0 - 46.0 % 50.5(H)  Platelets 150 - 400 K/uL 787(H)    . CMP Latest Ref Rng & Units 06/24/2020  Glucose 70 - 99 mg/dL 86  BUN 8 - 23 mg/dL 23  Creatinine 0.44 - 1.00 mg/dL 1.04(H)  Sodium 135 - 145 mmol/L 143  Potassium 3.5 - 5.1 mmol/L 3.6  Chloride 98 - 111 mmol/L 102  CO2 22 - 32 mmol/L 34(H)  Calcium 8.9 - 10.3 mg/dL 10.0  Total Protein 6.5 - 8.1 g/dL 7.4  Total Bilirubin 0.3 - 1.2 mg/dL 0.7  Alkaline Phos 38 - 126 U/L 77  AST 15 - 41 U/L 32  ALT 0 - 44 U/L 25     RADIOGRAPHIC STUDIES: I have personally reviewed the radiological images as listed and agreed with the findings in the report. No results found.  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 LDH borderline elevated. -Advised pt she does have the JAK2  positive mutation as seen in Myeloproliferative Neoplasm. Advised pt that most likely explanation is Polycythemia Vera based on clinical evaluation.  -Advised pt that Bm Bx is the only way we can see which MPN this is. JAK2 also associated with Myelofibrosis. Can clinically treat as Polycythemia Vera at this time if Bm Bx not desired. The pt does not wish to get the Bm Bx at this time. -Discussed typical treatment of normalizing the Plt and increased RBC-- Hydroxyurea. Want to control the increased Plt due to risk of blood clots, heart attacks, and strokes. -Advised pt that elevated LDH suggests against tissue scarring. -Discussed regiment of therapeutic phlebotomies to reduce RBC if needed. -Will send referral for counseling for medication. -Rx Hydroxyurea to [facility] for first time. -Continue ASA. -Will see back in 3 weeks for toxicity check for Hydroxyurea.    FOLLOW UP: RTC with Dr Irene Limbo with labs in 3 weeks   Orders Placed This Encounter  Procedures  . CBC with Differential/Platelet    Standing Status:   Future    Standing Expiration Date:   07/08/2021  . CMP ([place] only)    Standing Status:   Future    Standing Expiration Date:   07/08/2021  . Lactate dehydrogenase    Standing Status:   Future    Standing Expiration Date:   07/08/2021    All of the patients questions were answered with apparent satisfaction. The patient knows to call the clinic with any problems, questions or concerns.   The total time spent in the appointment was  30 minutes and more than 50% was on counseling and direct patient cares.     Sullivan Lone MD [place] AAHIVMS [facility] Hematology/Oncology Physician [facility]  (Office):       [phone] (Work cell):  [phone] (Fax):           [phone]  07/08/2020 3:29 PM  I, Reinaldo Raddle, am acting as scribe for Dr. Sullivan Lone, MD.   .I have reviewed the above documentation for accuracy and completeness, and I agree with  the above. Brunetta Genera MD

## 2020-07-08 ENCOUNTER — Inpatient Hospital Stay (HOSPITAL_BASED_OUTPATIENT_CLINIC_OR_DEPARTMENT_OTHER): Payer: Medicare PPO | Admitting: Hematology

## 2020-07-08 ENCOUNTER — Other Ambulatory Visit: Payer: Self-pay | Admitting: Hematology

## 2020-07-08 ENCOUNTER — Telehealth: Payer: Self-pay | Admitting: Pharmacist

## 2020-07-08 DIAGNOSIS — D471 Chronic myeloproliferative disease: Secondary | ICD-10-CM | POA: Diagnosis not present

## 2020-07-08 DIAGNOSIS — E079 Disorder of thyroid, unspecified: Secondary | ICD-10-CM | POA: Diagnosis not present

## 2020-07-08 DIAGNOSIS — D75839 Thrombocytosis, unspecified: Secondary | ICD-10-CM | POA: Diagnosis not present

## 2020-07-08 DIAGNOSIS — M8580 Other specified disorders of bone density and structure, unspecified site: Secondary | ICD-10-CM | POA: Diagnosis not present

## 2020-07-08 DIAGNOSIS — Z1589 Genetic susceptibility to other disease: Secondary | ICD-10-CM | POA: Diagnosis not present

## 2020-07-08 MED ORDER — HYDROXYUREA 500 MG PO CAPS: 500.0000 mg | ORAL_CAPSULE | Freq: Every day | ORAL | 1 refills | Status: DC

## 2020-07-08 MED FILL — HYDROXYUREA 500 MG CAPSULE: 500 | 30 days supply | Qty: 30 | Fill #0

## 2020-07-09 DIAGNOSIS — R002 Palpitations: Secondary | ICD-10-CM | POA: Diagnosis not present

## 2020-07-09 NOTE — Telephone Encounter (Signed)
Oral Chemotherapy Pharmacist Encounter   I spoke with patient for overview of new medication: Hydroxyurea for the treatment of JAK2 positive MPN, planned duration until disease progression or unacceptable drug toxicity, goal of maintaining platelet count < 400 K/uL.   Counseled on administration, dosing, side effects, monitoring, drug-food interactions, safe handling, storage, and disposal.   Patient will take Hydroxyurea 500 mg capsule, 1 capsule (500 mg total) by mouth daily. May take with food to minimize GI side effects.   Start date: 07/09/20   Side effects include but not limited to: decreased blood counts, headache, rash.     Reviewed importance of keeping a medication schedule and plan for any missed doses.   After discussion no patient barriers to medication adherence identified.    Patient voiced understanding and appreciation. All questions answered.   Medication education handout placed in mail for patient. Provided Oral Chemotherapy Navigation Clinic phone number. Patient knows to call the office with questions or concerns.    Leron Croak, PharmD, BCPS Hematology/Oncology Clinical Pharmacist [facility] [phone] 07/09/2020 10:58 AM

## 2020-07-09 NOTE — Telephone Encounter (Signed)
Oral Chemotherapy Pharmacist Encounter   Attempted to reach patient to provide update and offer for initial counseling on oral medication: Hydrea (hydroxyurea).   No answer. Left voicemail for patient to call back to discuss details of medication acquisition and initial counseling session.  Leron Croak, PharmD, BCPS Hematology/Oncology Clinical Pharmacist [facility] [phone] 07/09/2020 9:57 AM

## 2020-07-10 ENCOUNTER — Other Ambulatory Visit: Payer: Self-pay

## 2020-07-10 ENCOUNTER — Telehealth: Payer: Self-pay | Admitting: Hematology

## 2020-07-10 DIAGNOSIS — I10 Essential (primary) hypertension: Secondary | ICD-10-CM

## 2020-07-10 NOTE — Telephone Encounter (Signed)
Scheduled follow-up appointment per 3/21 los. Patient is aware. ?

## 2020-07-11 ENCOUNTER — Ambulatory Visit: Payer: Medicare PPO

## 2020-07-11 ENCOUNTER — Other Ambulatory Visit: Payer: Self-pay

## 2020-07-11 DIAGNOSIS — I10 Essential (primary) hypertension: Secondary | ICD-10-CM | POA: Diagnosis not present

## 2020-07-11 DIAGNOSIS — R0989 Other specified symptoms and signs involving the circulatory and respiratory systems: Secondary | ICD-10-CM | POA: Diagnosis not present

## 2020-07-16 ENCOUNTER — Telehealth: Payer: Self-pay | Admitting: *Deleted

## 2020-07-18 NOTE — Telephone Encounter (Signed)
Opened in error

## 2020-07-19 DIAGNOSIS — R002 Palpitations: Secondary | ICD-10-CM | POA: Diagnosis not present

## 2020-07-22 ENCOUNTER — Other Ambulatory Visit: Payer: Self-pay

## 2020-07-22 ENCOUNTER — Ambulatory Visit: Payer: Medicare PPO | Admitting: Cardiology

## 2020-07-22 ENCOUNTER — Encounter: Payer: Self-pay | Admitting: Cardiology

## 2020-07-22 VITALS — BP 167/67 | HR 66 | Temp 97.7°F | Ht 60.0 in | Wt 101.6 lb

## 2020-07-22 DIAGNOSIS — I10 Essential (primary) hypertension: Secondary | ICD-10-CM

## 2020-07-22 DIAGNOSIS — I2729 Other secondary pulmonary hypertension: Secondary | ICD-10-CM | POA: Insufficient documentation

## 2020-07-22 DIAGNOSIS — I272 Pulmonary hypertension, unspecified: Secondary | ICD-10-CM | POA: Insufficient documentation

## 2020-07-22 DIAGNOSIS — R0989 Other specified symptoms and signs involving the circulatory and respiratory systems: Secondary | ICD-10-CM | POA: Diagnosis not present

## 2020-07-22 DIAGNOSIS — I491 Atrial premature depolarization: Secondary | ICD-10-CM | POA: Diagnosis not present

## 2020-07-22 MED ORDER — LOSARTAN POTASSIUM 100 MG PO TABS
100.0000 mg | ORAL_TABLET | Freq: Every day | ORAL | 3 refills | Status: DC
Start: 2020-07-22 — End: 2021-07-18

## 2020-07-22 NOTE — Progress Notes (Signed)
Patient referred by Haywood Pao, MD for leg edema  Subjective:   [name], female    DOB: Nov 21, 1932, 87 y.o.   MRN: [id number]   Chief Complaint  Patient presents with  . Hypertension  . Follow-up    3-4 weeks    HPI  87 y.o. Caucasian female with hypertension, CKD 3a, COPD, pulmonary hypertension, multinodular goiter w/h/o thyrotoxicosis  Patient has noted recurrent symptoms of palpitations.  She is being started on hydroxyurea by Dr. Irene Limbo for thrombocytosis.  Discussed Creacy test results with the patient, details below.  Initial consultation HPI 87 y.o.: Patient reports episodes of palpitations, mostly at night. Episodes have increase din frequency. Blood pressure is elevated today, was noted to be 152/80 mmHg at recent PCP visit. She denies chest pain, shortness of breath, palpitations, orthopnea, PND, TIA/syncope. She reports leg edema. She tells me she is known to have PAD, her feet stay cold, but denies any claudication symptoms.  She is seeing hematologist Dr. Irene Limbo for thrombocytosis, has upcoming blood work pending to differentiate between clonal and reactive causes.      Current Outpatient Medications on File Prior to Visit  Medication Sig Dispense Refill  . aspirin 81 MG tablet Take 81 mg by mouth daily.    . calcium-vitamin D 250-100 MG-UNIT per tablet Take 1 tablet by mouth 2 (two) times daily.    Marland Kitchen diltiazem (DILACOR XR) 180 MG 24 hr capsule Take 180 mg by mouth daily.    . furosemide (LASIX) 20 MG tablet Take 20 mg by mouth 2 (two) times daily.    . hydroxyurea (HYDREA) 500 MG capsule TAKE 1 CAPSULE BY MOUTH ONCE A DAY. MAY TAKE WITH FOOD TO MINIMIZE GI SIDE EFFECTS 30 capsule 1  . loratadine (CLARITIN) 10 MG tablet Take 10 mg by mouth daily.    Marland Kitchen losartan (COZAAR) 50 MG tablet Take 1 tablet (50 mg total) by mouth daily. 30 tablet 3  . methimazole (TAPAZOLE) 5 MG tablet Take 5 mg by mouth 3 (three) times daily.    . psyllium (METAMUCIL) 58.6 %  packet Take 1 packet by mouth daily.    . timolol (TIMOPTIC) 0.5 % ophthalmic solution Place 1 drop into both eyes daily.     No current facility-administered medications on file prior to visit.    Cardiovascular and other pertinent studies:  Echocardiogram 07/11/2020:  Normal LV systolic function with visual EF 60-65%. Left ventricle cavity  is normal in size. Mild left ventricular hypertrophy. Normal global wall  motion. Indeterminate diastolic filling pattern, normal  Left atrial cavity is mildly dilated.  Right atrial cavity is moderately dilated.  Mild (Grade I) aortic regurgitation.  Mild (Grade I) mitral regurgitation.  Moderate tricuspid regurgitation. Moderate pulmonary hypertension. RVSP  measures 56 mmHg.  Insignificant pericardial effusion.  IVC is dilated with a respiratory response of <50%.  No prior study for comparison.   ABI 07/11/2020:  This exam reveals normal perfusion of the right lower extremity (ABI 1.00)  with triphasic waveform.   This exam reveals mildly decreased perfusion of the left lower extremity,  noted at the post tibial artery level (ABI 0.84) with mildly abnormal  biphasic waveform. Left AT appears to be diffusely diseased.  Mobile cardiac telemetry 7 days 06/26/2020 - 07/03/2020: Dominant rhythm: Sinus. HR 58-96 bpm. Avg HR 68 bpm, in sinus rhythm. 12 episodes of SVT/atrial tachycardia, fastest at 162 bpm for 8 beats, longest for 7 beatsat 109 bpm. 7.2% isolated SVE, <1 couplet/triplets. <1%  isolated VE, couplets No atrial fibrillation/atrial flutter//VT/high grade AV block, sinus pause >3sec noted. 10 patient triggered events, most correlated with SVE.   EKG 06/26/2020: Sinus rhythm 69 bpm Left atrial enlargement.  Old anteroseptal infarct Probable LVH   Recent labs: 06/12/2020: Glucose 68, BUN/Cr 21/1.0. EGFR 52. Na/K 142/4.3. Rest of the CMP normal H/H 16/49. MCV 96. Platelets 701 HbA1C N/A Lipid panel N/A TSH 4.6 high. Free T4 0.9  normal NT pro BNP 571 normal    Review of Systems  Cardiovascular: Positive for leg swelling. Negative for chest pain, dyspnea on exertion, palpitations and syncope.         Vitals:   07/22/20 1335 07/22/20 1343  BP: (!) 168/75 (!) 167/67  Pulse: 66 66  Temp: 97.7 F (36.5 C)   SpO2: 97% 97%     Body mass index is 19.84 kg/m. Filed Weights   07/22/20 1335  Weight: 101 lb 9.6 oz (46.1 kg)     Objective:   Physical Exam Vitals and nursing note reviewed.  Constitutional:      General: She is not in acute distress. Neck:     Vascular: No JVD.  Cardiovascular:     Rate and Rhythm: Normal rate and regular rhythm.     Pulses:          Dorsalis pedis pulses are 1+ on the right side and 1+ on the left side.       Posterior tibial pulses are 0 on the right side and 0 on the left side.     Heart sounds: Murmur heard.  High-pitched blowing holosystolic murmur is present with a grade of 2/6 at the apex.     Comments: Dusky appearance of bilateral feet, good capillary refill Pulmonary:     Effort: Pulmonary effort is normal.     Breath sounds: Normal breath sounds. No wheezing or rales.  Musculoskeletal:     Right lower leg: Edema (Trace) present.     Left lower leg: Edema (Trace) present.         Assessment & Recommendations:   87 y.o. Caucasian female with hypertension, CKD 3a, COPD, pulmonary hypertension, multinodular goiter w/h/o thyrotoxicosis  Palpitations:  Secondary to PACs and occasional atrial tachycardia.  Continue diltiazem.  Pulmonary hypertension: Known to have pulmonary hypertension.  Suspect this is WHO group 2/3.  Continue conservative management given age and other comorbidities.  Hypertension: Uncontrolled.  Increase losartan to 100 mg daily.  Check BMP in 1 week.  PAD: Mild PAD LLE. Continue risk factor modification  F/u in 3 months   Nigel Mormon, MD Pager: [phone] Office: [phone]

## 2020-07-28 NOTE — Progress Notes (Signed)
HEMATOLOGY/ONCOLOGY CONSULTATION NOTE  Date of Service: 07/29/2020  Patient Care Team: [name], Fransico Him, MD as PCP - General (Internal Medicine)  CHIEF COMPLAINTS/PURPOSE OF CONSULTATION:  Polycythemia Vera  HISTORY OF PRESENTING ILLNESS:   [name] is a wonderful 87 y.o. female who has been referred to [place] by Dr. Domenick Gong, MD for evaluation and management of elevated Plt count. The pt reports that she is doing well overall. We are joined today by her husband, [name].  The pt had labwork, 06/12/2020 of CBC had a Plt of 701K.   The pt reports that this is the first time she has noticed her elevated Plt count. She notes her Plt have been increasing since 2018, but were normal. She notes they were slightly elevated in 2020 and have been the highest before when she got labs done recently last month. The pt notes that she did experience a severe injury and bruising one year ago in an accident with a wood splitter. There has not been any other recent injuries or infections. The pt denies any recent reasons for bleeding. The pt notes post-nasal drip, a minor cough, and a toxic goiter. She notes she has been the healthiest and had the least infection issues since COVID and the use of masks. The pt notes there were no blood clots with her injury from an [place] on 07/06/2019, and the bruising and swelling has improved on surface level. She notes intermittent sting an pain in the leg.  The pt denies any history of smoking. She also denies any new symptoms that are acute. The pt notes she has not been feeling her old baseline, but notes no issues acutely or unexplained changes in how she has felt. The pt is eating well. The pt denies any history of blood clots, heart attacks, strokes. The pt notes she has an appointment with a Cardiologist this week in two days. She notes a weak beat and some irregular heartbeats. She has been more aware of her heartbeats recently and has been swelling in her  feet more. The pt notes she also has Pulmonary Hypertension, but was never aware of the cause of this. The pt still has her spleen. The pt notes some new crippling in her left hand and continued in her right. The pt notes some hip pain and denies any history of gout. The pt notes her last thyroid levels were checked on 06/12/2020 with other blood counts and there were no concerns.   On review of systems, pt reports leg swelling and denies fevers, chills, sudden unexpected weight loss, SOB, chest pain, back pain, changes in bowel habits, and any other symptoms.   INTERVAL HISTORY  [name] is a wonderful 87 y.o. female who is here today for evaluation and management of Polycythemia Vera. The patient's last visit with [place] was on 07/08/2020. The pt reports that she is doing well overall.  The pt reports no new symptoms or concerns. The pt notes that she has been experiencing allergies and phlegm recently. The pt notes she takes Claritin and has a history of allergies around this time due to pollen. The pt notes that she is now taking 100 mg of Losartan instead of 50 mg.  Lab results today 07/29/2020 of CBC w/diff and CMP is as follows: all values are WNL except for Hgb of 16.0, HCT of 49.1, MCV of 100.2, RDW of 16.5, Plt of 556K, Glucose of 102, Creatinine of 1.07, GFR est of 51. 07/29/2020 LDH  of 233.  On review of systems, pt reports seasonal allergies and denies abdominal pain and any other symptoms.  MEDICAL HISTORY:  Past Medical History:  Diagnosis Date  . Osteopenia   . Thyroid disease   . Varicose veins VENOUS INSUFFICIENCY SLOW HEALING OF WOUNDS Charissa Bash TO LOWER EXTEMETIES  . Venous stasis dermatitis     SURGICAL HISTORY: Past Surgical History:  Procedure Laterality Date  . BREAST CYST ASPIRATION Left   . EYE SURGERY    . TONSILLECTOMY      SOCIAL HISTORY: Social History   Socioeconomic History  . Marital status: Married    Spouse name: Not on file  . Number of  children: 2  . Years of education: Not on file  . Highest education level: Not on file  Occupational History  . Not on file  Tobacco Use  . Smoking status: Never Smoker  . Smokeless tobacco: Never Used  Vaping Use  . Vaping Use: Never used  Substance and Sexual Activity  . Alcohol use: No  . Drug use: Never  . Sexual activity: Not on file  Other Topics Concern  . Not on file  Social History Narrative  . Not on file   Social Determinants of Health   Financial Resource Strain: Not on file  Food Insecurity: Not on file  Transportation Needs: Not on file  Physical Activity: Not on file  Stress: Not on file  Social Connections: Not on file  Intimate Partner Violence: Not on file    FAMILY HISTORY: Family History  Problem Relation Age of Onset  . Heart disease Father   . Cancer Son   . Cancer Maternal Aunt   . Cancer Paternal Uncle   . Cancer Maternal Grandfather   . Breast cancer Neg Hx     ALLERGIES:  is allergic to doxycycline calcium and penicillin v.  MEDICATIONS:  Current Outpatient Medications  Medication Sig Dispense Refill  . aspirin 81 MG tablet Take 81 mg by mouth daily.    . calcium-vitamin D 250-100 MG-UNIT per tablet Take 1 tablet by mouth 2 (two) times daily.    Marland Kitchen diltiazem (DILACOR XR) 180 MG 24 hr capsule Take 180 mg by mouth daily.    . furosemide (LASIX) 20 MG tablet Take 20 mg by mouth 2 (two) times daily.    . hydroxyurea (HYDREA) 500 MG capsule TAKE 1 CAPSULE BY MOUTH ONCE A Palmer. MAY TAKE WITH FOOD TO MINIMIZE GI SIDE EFFECTS 30 capsule 1  . loratadine (CLARITIN) 10 MG tablet Take 10 mg by mouth daily.    Marland Kitchen losartan (COZAAR) 100 MG tablet Take 1 tablet (100 mg total) by mouth daily. 90 tablet 3  . methimazole (TAPAZOLE) 5 MG tablet Take 5 mg by mouth 3 (three) times daily.    . psyllium (METAMUCIL) 58.6 % packet Take 1 packet by mouth daily.    . timolol (TIMOPTIC) 0.5 % ophthalmic solution Place 1 drop into both eyes daily.     No current  facility-administered medications for this visit.    REVIEW OF SYSTEMS:   10 Point review of Systems was done is negative except as noted above.  PHYSICAL EXAMINATION: ECOG PERFORMANCE STATUS: 1 - Symptomatic but completely ambulatory  . Vitals:   07/29/20 0938  BP: (!) 190/75  Pulse: 65  Resp: 16  Temp: 98.1 F (36.7 C)  SpO2: 98%   Filed Weights   07/29/20 0938  Weight: 99 lb 3.2 oz (45 kg)   .Body mass index  is 19.37 kg/m.  Exam was given in a chair.  GENERAL:alert, in no acute distress and comfortable SKIN: no acute rashes, no significant lesions EYES: conjunctiva are pink and non-injected, sclera anicteric OROPHARYNX: MMM, no exudates, no oropharyngeal erythema or ulceration NECK: supple, no JVD LYMPH:  no palpable lymphadenopathy in the cervical, axillary or inguinal regions LUNGS: clear to auscultation b/l with normal respiratory effort HEART: regular rate & rhythm ABDOMEN:  normoactive bowel sounds , non tender, not distended. Extremity: trace leg swelling PSYCH: alert & oriented x 3 with fluent speech NEURO: no focal motor/sensory deficits   LABORATORY DATA:  I have reviewed the data as listed  CBC Latest Ref Rng & Units 07/29/2020 06/24/2020  WBC 4.0 - 10.5 K/uL 8.7 9.0  Hemoglobin 12.0 - 15.0 g/dL 16.0(H) 16.2(H)  Hematocrit 36.0 - 46.0 % 49.1(H) 50.5(H)  Platelets 150 - 400 K/uL 556(H) 787(H)    . CMP Latest Ref Rng & Units 07/29/2020 06/24/2020  Glucose 70 - 99 mg/dL 102(H) 86  BUN 8 - 23 mg/dL 21 23  Creatinine 0.44 - 1.00 mg/dL 1.07(H) 1.04(H)  Sodium 135 - 145 mmol/L 144 143  Potassium 3.5 - 5.1 mmol/L 4.0 3.6  Chloride 98 - 111 mmol/L 101 102  CO2 22 - 32 mmol/L 31 34(H)  Calcium 8.9 - 10.3 mg/dL 9.9 10.0  Total Protein 6.5 - 8.1 g/dL 7.5 7.4  Total Bilirubin 0.3 - 1.2 mg/dL 0.8 0.7  Alkaline Phos 38 - 126 U/L 81 77  AST 15 - 41 U/L 30 32  ALT 0 - 44 U/L 22 25     RADIOGRAPHIC STUDIES: I have personally reviewed the radiological images  as listed and agreed with the findings in the report. LONG TERM MONITOR (3-14 DAYS)  Result Date: 07/19/2020 Mobile cardiac telemetry 7 days 06/26/2020 - 07/03/2020: Dominant rhythm: Sinus. HR 58-96 bpm. Avg HR 68 bpm, in sinus rhythm. 12 episodes of SVT/atrial tachycardia, fastest at 162 bpm for 8 beats, longest for 7 beatsat 109 bpm. 7.2% isolated SVE, <1 couplet/triplets. <1% isolated VE, couplets No atrial fibrillation/atrial flutter//VT/high grade AV block, sinus pause >3sec noted. 10 patient triggered events, most correlated with SVE.   PCV ECHOCARDIOGRAM COMPLETE  Result Date: 07/13/2020 Echocardiogram 07/11/2020: Normal LV systolic function with visual EF 60-65%. Left ventricle cavity is normal in size. Mild left ventricular hypertrophy. Normal global wall motion. Indeterminate diastolic filling pattern, normal Left atrial cavity is mildly dilated. Right atrial cavity is moderately dilated. Mild (Grade I) aortic regurgitation. Mild (Grade I) mitral regurgitation. Moderate tricuspid regurgitation. Moderate pulmonary hypertension. RVSP measures 56 mmHg. Insignificant pericardial effusion. IVC is dilated with a respiratory response of <50%. No prior study for comparison.   PCV ANKLE BRACHIAL INDEX (ABI)  Result Date: 07/12/2020 ABI 07/11/2020: This exam reveals normal perfusion of the right lower extremity (ABI 1.00) with triphasic waveform.  This exam reveals mildly decreased perfusion of the left lower extremity, noted at the post tibial artery level (ABI 0.84) with mildly abnormal biphasic waveform. Left AT appears to be diffusely diseased.   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 Counts stable. -Goal Plt 200-400K, HCT > 45. -Advised pt that Hydroxyurea can increase the risk of infections if elevated WBC. Advised pt that she is on a very  low dose and should not be causing increased mucus/phlegm. -Recommended pt  get the second COVID booster when eligible as just approved by the CDC. -Discussed Evusheld and pt's eligibility. Will send referral. -Recommended pt still take infection precautions due to [place]. -Advised pt that Hydroxyurea increases skin sensitivity to the sun and risk of sunburn. -Will continue to monitor counts to reduce nee for phlebotomies. -Recommended pt drink 48-64 oz water daily, eat healthy, and sleep well. -Will continue with same dosage of 500 mg Hydroxyurea daily at this time. -Avoid Iron supplements. -Continue ASA. -Will see back in 4 week with labs.    FOLLOW UP: RTC with Dr Irene Limbo with labs in 1 month    No orders of the defined types were placed in this encounter.   All of the patients questions were answered with apparent satisfaction. The patient knows to call the clinic with any problems, questions or concerns.   The total time spent in the appointment was 20 minutes and more than 50% was on counseling and direct patient cares.     Sullivan Lone MD [place] AAHIVMS [facility] Hematology/Oncology Physician [facility]  (Office):       [phone] (Work cell):  [phone] (Fax):           [phone]  07/29/2020 10:33 AM  I, Reinaldo Raddle, am acting as scribe for Dr. Sullivan Lone, MD.   .I have reviewed the above documentation for accuracy and completeness, and I agree with the above. Brunetta Genera MD

## 2020-07-29 ENCOUNTER — Inpatient Hospital Stay: Payer: Medicare PPO | Attending: Hematology | Admitting: Hematology

## 2020-07-29 ENCOUNTER — Other Ambulatory Visit: Payer: Self-pay

## 2020-07-29 ENCOUNTER — Inpatient Hospital Stay: Payer: Medicare PPO

## 2020-07-29 VITALS — BP 190/75 | HR 65 | Temp 98.1°F | Resp 16 | Ht 60.0 in | Wt 99.2 lb

## 2020-07-29 DIAGNOSIS — D471 Chronic myeloproliferative disease: Secondary | ICD-10-CM | POA: Diagnosis not present

## 2020-07-29 DIAGNOSIS — Z1589 Genetic susceptibility to other disease: Secondary | ICD-10-CM

## 2020-07-29 DIAGNOSIS — D751 Secondary polycythemia: Secondary | ICD-10-CM | POA: Diagnosis not present

## 2020-07-29 LAB — CBC WITH DIFFERENTIAL/PLATELET
Abs Immature Granulocytes: 0.04 10*3/uL (ref 0.00–0.07)
Basophils Absolute: 0.1 10*3/uL (ref 0.0–0.1)
Basophils Relative: 1 %
Eosinophils Absolute: 0.3 10*3/uL (ref 0.0–0.5)
Eosinophils Relative: 3 %
HCT: 49.1 % — ABNORMAL HIGH (ref 36.0–46.0)
Hemoglobin: 16 g/dL — ABNORMAL HIGH (ref 12.0–15.0)
Immature Granulocytes: 1 %
Lymphocytes Relative: 13 %
Lymphs Abs: 1.2 10*3/uL (ref 0.7–4.0)
MCH: 32.7 pg (ref 26.0–34.0)
MCHC: 32.6 g/dL (ref 30.0–36.0)
MCV: 100.2 fL — ABNORMAL HIGH (ref 80.0–100.0)
Monocytes Absolute: 0.9 10*3/uL (ref 0.1–1.0)
Monocytes Relative: 10 %
Neutro Abs: 6.3 10*3/uL (ref 1.7–7.7)
Neutrophils Relative %: 72 %
Platelets: 556 10*3/uL — ABNORMAL HIGH (ref 150–400)
RBC: 4.9 MIL/uL (ref 3.87–5.11)
RDW: 16.5 % — ABNORMAL HIGH (ref 11.5–15.5)
WBC: 8.7 10*3/uL (ref 4.0–10.5)
nRBC: 0 % (ref 0.0–0.2)

## 2020-07-29 LAB — CMP (CANCER CENTER ONLY)
ALT: 22 U/L (ref 0–44)
AST: 30 U/L (ref 15–41)
Albumin: 4.3 g/dL (ref 3.5–5.0)
Alkaline Phosphatase: 81 U/L (ref 38–126)
Anion gap: 12 (ref 5–15)
BUN: 21 mg/dL (ref 8–23)
CO2: 31 mmol/L (ref 22–32)
Calcium: 9.9 mg/dL (ref 8.9–10.3)
Chloride: 101 mmol/L (ref 98–111)
Creatinine: 1.07 mg/dL — ABNORMAL HIGH (ref 0.44–1.00)
GFR, Estimated: 51 mL/min — ABNORMAL LOW (ref >60)
Glucose, Bld: 102 mg/dL — ABNORMAL HIGH (ref 70–99)
Potassium: 4 mmol/L (ref 3.5–5.1)
Sodium: 144 mmol/L (ref 135–145)
Total Bilirubin: 0.8 mg/dL (ref 0.3–1.2)
Total Protein: 7.5 g/dL (ref 6.5–8.1)

## 2020-07-29 LAB — LACTATE DEHYDROGENASE: LDH: 233 U/L — ABNORMAL HIGH (ref 98–192)

## 2020-08-01 ENCOUNTER — Telehealth: Payer: Self-pay

## 2020-08-01 NOTE — Telephone Encounter (Signed)
Returned call to pt. Assured pt scheduling would contact her and set up an appointment. Instructed pt zyrtec is ok to take with her Hydrea/ not containdicated. Pt verbalized understanding.

## 2020-08-02 ENCOUNTER — Telehealth: Payer: Self-pay | Admitting: Hematology

## 2020-08-02 NOTE — Telephone Encounter (Signed)
Scheduled follow-up appointment per 4/11 los. Patient is aware.

## 2020-08-05 ENCOUNTER — Other Ambulatory Visit (HOSPITAL_COMMUNITY): Payer: Self-pay

## 2020-08-05 MED FILL — Hydroxyurea Cap 500 MG: ORAL | 30 days supply | Qty: 30 | Fill #0 | Status: AC

## 2020-08-20 DIAGNOSIS — L570 Actinic keratosis: Secondary | ICD-10-CM | POA: Diagnosis not present

## 2020-08-20 DIAGNOSIS — X32XXXD Exposure to sunlight, subsequent encounter: Secondary | ICD-10-CM | POA: Diagnosis not present

## 2020-08-20 DIAGNOSIS — L821 Other seborrheic keratosis: Secondary | ICD-10-CM | POA: Diagnosis not present

## 2020-09-05 ENCOUNTER — Other Ambulatory Visit (HOSPITAL_COMMUNITY): Payer: Self-pay

## 2020-09-05 ENCOUNTER — Other Ambulatory Visit: Payer: Self-pay | Admitting: Hematology

## 2020-09-05 MED ORDER — HYDROXYUREA 500 MG PO CAPS
ORAL_CAPSULE | ORAL | 1 refills | Status: DC
  Filled 2020-09-05: qty 30, 30d supply, fill #0
  Filled 2020-10-07: qty 30, 30d supply, fill #1

## 2020-09-05 NOTE — Progress Notes (Signed)
HEMATOLOGY/ONCOLOGY CONSULTATION NOTE  Date of Service: 09/06/2020  Patient Care Team: [name], Fransico Him, MD as PCP - General (Internal Medicine)  CHIEF COMPLAINTS/PURPOSE OF CONSULTATION:  Polycythemia Vera  HISTORY OF PRESENTING ILLNESS:   [name] is a wonderful 87 y.o. female who has been referred to [place] by Dr. Domenick Gong, MD for evaluation and management of elevated Plt count. The pt reports that she is doing well overall. We are joined today by her husband, Deidre Ala.  The pt had labwork, 06/12/2020 of CBC had a Plt of 701K.   The pt reports that this is the first time she has noticed her elevated Plt count. She notes her Plt have been increasing since 2018, but were normal. She notes they were slightly elevated in 2020 and have been the highest before when she got labs done recently last month. The pt notes that she did experience a severe injury and bruising one year ago in an accident with a wood splitter. There has not been any other recent injuries or infections. The pt denies any recent reasons for bleeding. The pt notes post-nasal drip, a minor cough, and a toxic goiter. She notes she has been the healthiest and had the least infection issues since COVID and the use of masks. The pt notes there were no blood clots with her injury from an [place] on 07/06/2019, and the bruising and swelling has improved on surface level. She notes intermittent sting an pain in the leg.  The pt denies any history of smoking. She also denies any new symptoms that are acute. The pt notes she has not been feeling her old baseline, but notes no issues acutely or unexplained changes in how she has felt. The pt is eating well. The pt denies any history of blood clots, heart attacks, strokes. The pt notes she has an appointment with a Cardiologist this week in two days. She notes a weak beat and some irregular heartbeats. She has been more aware of her heartbeats recently and has been swelling in her  feet more. The pt notes she also has Pulmonary Hypertension, but was never aware of the cause of this. The pt still has her spleen. The pt notes some new crippling in her left hand and continued in her right. The pt notes some hip pain and denies any history of gout. The pt notes her last thyroid levels were checked on 06/12/2020 with other blood counts and there were no concerns.   On review of systems, pt reports leg swelling and denies fevers, chills, sudden unexpected weight loss, SOB, chest pain, back pain, changes in bowel habits, and any other symptoms.   INTERVAL HISTORY  [name] is a wonderful 87 y.o. female who is here today for evaluation and management of Polycythemia Vera. The patient's last visit with [place] was on 07/29/2020. The pt reports that she is doing well overall.  The pt reports that she no longer is taking Claritin, but takes Zyrtec when needed for her allergies. She notes no acute symptoms. The pt notes no issue tolerating the Hydroxyurea one pill daily.  Lab results today 09/06/2020 of CBC w/diff and CMP is as follows: all values are WNL except for MCV of 105.8, MCH of 34.8, RDW of 19.2, Glucose of 126, BUN of 26, Creatinine of 1.13, GFR est of 48. 09/06/2020 LDH of 196.  On review of systems, pt reports frequent urination, chronic leg swelling and denies stomach issues, diarrhea, decreased appetite, and any  other symptoms.Marland Kitchen  MEDICAL HISTORY:  Past Medical History:  Diagnosis Date  . Osteopenia   . Thyroid disease   . Varicose veins VENOUS INSUFFICIENCY SLOW HEALING OF WOUNDS Charissa Bash TO LOWER EXTEMETIES  . Venous stasis dermatitis     SURGICAL HISTORY: Past Surgical History:  Procedure Laterality Date  . BREAST CYST ASPIRATION Left   . EYE SURGERY    . TONSILLECTOMY      SOCIAL HISTORY: Social History   Socioeconomic History  . Marital status: Married    Spouse name: Not on file  . Number of children: 2  . Years of education: Not on file  .  Highest education level: Not on file  Occupational History  . Not on file  Tobacco Use  . Smoking status: Never Smoker  . Smokeless tobacco: Never Used  Vaping Use  . Vaping Use: Never used  Substance and Sexual Activity  . Alcohol use: No  . Drug use: Never  . Sexual activity: Not on file  Other Topics Concern  . Not on file  Social History Narrative  . Not on file   Social Determinants of Health   Financial Resource Strain: Not on file  Food Insecurity: Not on file  Transportation Needs: Not on file  Physical Activity: Not on file  Stress: Not on file  Social Connections: Not on file  Intimate Partner Violence: Not on file    FAMILY HISTORY: Family History  Problem Relation Age of Onset  . Heart disease Father   . Cancer Son   . Cancer Maternal Aunt   . Cancer Paternal Uncle   . Cancer Maternal Grandfather   . Breast cancer Neg Hx     ALLERGIES:  is allergic to doxycycline calcium and penicillin v.  MEDICATIONS:  Current Outpatient Medications  Medication Sig Dispense Refill  . aspirin 81 MG tablet Take 81 mg by mouth daily.    . calcium-vitamin D 250-100 MG-UNIT per tablet Take 1 tablet by mouth 2 (two) times daily.    Marland Kitchen diltiazem (DILACOR XR) 180 MG 24 hr capsule Take 180 mg by mouth daily.    . furosemide (LASIX) 20 MG tablet Take 20 mg by mouth 2 (two) times daily.    . hydroxyurea (HYDREA) 500 MG capsule TAKE 1 CAPSULE BY MOUTH ONCE A DAY. MAY TAKE WITH FOOD TO MINIMIZE GI SIDE EFFECTS 30 capsule 1  . loratadine (CLARITIN) 10 MG tablet Take 10 mg by mouth daily.    Marland Kitchen losartan (COZAAR) 100 MG tablet Take 1 tablet (100 mg total) by mouth daily. 90 tablet 3  . methimazole (TAPAZOLE) 5 MG tablet Take 5 mg by mouth 3 (three) times daily.    . psyllium (METAMUCIL) 58.6 % packet Take 1 packet by mouth daily.    . timolol (TIMOPTIC) 0.5 % ophthalmic solution Place 1 drop into both eyes daily.     No current facility-administered medications for this visit.     REVIEW OF SYSTEMS:   10 Point review of Systems was done is negative except as noted above.  PHYSICAL EXAMINATION: ECOG PERFORMANCE STATUS: 1 - Symptomatic but completely ambulatory  . Vitals:   09/06/20 1007  BP: (!) 176/65  Pulse: 68  Resp: 17  Temp: (!) 97 F (36.1 C)  SpO2: 97%   Filed Weights   09/06/20 1007  Weight: 99 lb 8 oz (45.1 kg)   .Body mass index is 19.43 kg/m.  Exam was given in a chair.   GENERAL:alert, in no acute  distress and comfortable SKIN: no acute rashes, no significant lesions EYES: conjunctiva are pink and non-injected, sclera anicteric OROPHARYNX: MMM, no exudates, no oropharyngeal erythema or ulceration NECK: supple, no JVD LYMPH:  no palpable lymphadenopathy in the cervical, axillary or inguinal regions LUNGS: clear to auscultation b/l with normal respiratory effort HEART: regular rate & rhythm ABDOMEN:  normoactive bowel sounds , non tender, not distended. Extremity: trace leg swelling PSYCH: alert & oriented x 3 with fluent speech NEURO: no focal motor/sensory deficits   LABORATORY DATA:  I have reviewed the data as listed  CBC Latest Ref Rng & Units 09/06/2020 07/29/2020 06/24/2020  WBC 4.0 - 10.5 K/uL 7.9 8.7 9.0  Hemoglobin 12.0 - 15.0 g/dL 13.9 16.0(H) 16.2(H)  Hematocrit 36.0 - 46.0 % 42.2 49.1(H) 50.5(H)  Platelets 150 - 400 K/uL 372 556(H) 787(H)    . CMP Latest Ref Rng & Units 09/06/2020 07/29/2020 06/24/2020  Glucose 70 - 99 mg/dL 126(H) 102(H) 86  BUN 8 - 23 mg/dL 26(H) 21 23  Creatinine 0.44 - 1.00 mg/dL 1.13(H) 1.07(H) 1.04(H)  Sodium 135 - 145 mmol/L 144 144 143  Potassium 3.5 - 5.1 mmol/L 3.8 4.0 3.6  Chloride 98 - 111 mmol/L 103 101 102  CO2 22 - 32 mmol/L 31 31 34(H)  Calcium 8.9 - 10.3 mg/dL 9.6 9.9 10.0  Total Protein 6.5 - 8.1 g/dL 6.9 7.5 7.4  Total Bilirubin 0.3 - 1.2 mg/dL 0.7 0.8 0.7  Alkaline Phos 38 - 126 U/L 81 81 77  AST 15 - 41 U/L 26 30 32  ALT 0 - 44 U/L 17 22 25      RADIOGRAPHIC STUDIES: I  have personally reviewed the radiological images as listed and agreed with the findings in the report. No results found.  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 Chemistries stable, LDH improved. -Recommended pt increase the potassium in her diet. Advised pt she may need to discuss potassium supplementation due to being on Lasix. (Orange juice / coconut water) -Discussed Evusheld and pt's eligibility. Will send referral. Advised pt to wait around one month after this before getting the second booster shot. -Recommended pt receive the second COVID booster shot as recently approved. Advised pt to wait 4-6 months following first booster shot before getting this. -Recommended pt drink 48-64 oz water daily, eat healthy, and sleep well. -Will continue with same dosage of 500 mg Hydroxyurea daily at this time. -Avoid Iron supplements. -Recommended pt start Vitamin B-Complex daily. -Continue ASA. -Will see back in 6 weeks with labs    FOLLOW UP: RTC w Dr Irene Limbo w labs in 6 weeks Referral for Evusheld    No orders of the defined types were placed in this encounter.   All of the patients questions were answered with apparent satisfaction. The patient knows to call the clinic with any problems, questions or concerns.   The total time spent in the appointment was 20 minutes and more than 50% was on counseling and direct patient cares.     Sullivan Lone MD [place] AAHIVMS [facility] Hematology/Oncology Physician [facility]  (Office):       [phone] (Work cell):  [phone] (Fax):           [phone]  09/06/2020 10:49 AM  I, Reinaldo Raddle, am acting as scribe for Dr. Sullivan Lone, MD.  .I  have reviewed the above documentation for accuracy and completeness, and I agree with the above. Brunetta Genera MD

## 2020-09-06 ENCOUNTER — Other Ambulatory Visit: Payer: Self-pay

## 2020-09-06 ENCOUNTER — Inpatient Hospital Stay: Payer: Medicare PPO | Attending: Hematology | Admitting: Hematology

## 2020-09-06 ENCOUNTER — Telehealth: Payer: Self-pay | Admitting: Hematology

## 2020-09-06 ENCOUNTER — Inpatient Hospital Stay: Payer: Medicare PPO

## 2020-09-06 VITALS — BP 176/65 | HR 68 | Temp 97.0°F | Resp 17 | Ht 60.0 in | Wt 99.5 lb

## 2020-09-06 DIAGNOSIS — Z79899 Other long term (current) drug therapy: Secondary | ICD-10-CM | POA: Insufficient documentation

## 2020-09-06 DIAGNOSIS — Z809 Family history of malignant neoplasm, unspecified: Secondary | ICD-10-CM | POA: Insufficient documentation

## 2020-09-06 DIAGNOSIS — D471 Chronic myeloproliferative disease: Secondary | ICD-10-CM | POA: Diagnosis not present

## 2020-09-06 DIAGNOSIS — Z1589 Genetic susceptibility to other disease: Secondary | ICD-10-CM

## 2020-09-06 DIAGNOSIS — Z7982 Long term (current) use of aspirin: Secondary | ICD-10-CM | POA: Diagnosis not present

## 2020-09-06 DIAGNOSIS — D45 Polycythemia vera: Secondary | ICD-10-CM | POA: Diagnosis not present

## 2020-09-06 LAB — CBC WITH DIFFERENTIAL/PLATELET
Abs Immature Granulocytes: 0.02 10*3/uL (ref 0.00–0.07)
Basophils Absolute: 0.1 10*3/uL (ref 0.0–0.1)
Basophils Relative: 1 %
Eosinophils Absolute: 0.3 10*3/uL (ref 0.0–0.5)
Eosinophils Relative: 4 %
HCT: 42.2 % (ref 36.0–46.0)
Hemoglobin: 13.9 g/dL (ref 12.0–15.0)
Immature Granulocytes: 0 %
Lymphocytes Relative: 18 %
Lymphs Abs: 1.4 10*3/uL (ref 0.7–4.0)
MCH: 34.8 pg — ABNORMAL HIGH (ref 26.0–34.0)
MCHC: 32.9 g/dL (ref 30.0–36.0)
MCV: 105.8 fL — ABNORMAL HIGH (ref 80.0–100.0)
Monocytes Absolute: 0.7 10*3/uL (ref 0.1–1.0)
Monocytes Relative: 9 %
Neutro Abs: 5.3 10*3/uL (ref 1.7–7.7)
Neutrophils Relative %: 68 %
Platelets: 372 10*3/uL (ref 150–400)
RBC: 3.99 MIL/uL (ref 3.87–5.11)
RDW: 19.2 % — ABNORMAL HIGH (ref 11.5–15.5)
WBC: 7.9 10*3/uL (ref 4.0–10.5)
nRBC: 0 % (ref 0.0–0.2)

## 2020-09-06 LAB — CMP (CANCER CENTER ONLY)
ALT: 17 U/L (ref 0–44)
AST: 26 U/L (ref 15–41)
Albumin: 3.8 g/dL (ref 3.5–5.0)
Alkaline Phosphatase: 81 U/L (ref 38–126)
Anion gap: 10 (ref 5–15)
BUN: 26 mg/dL — ABNORMAL HIGH (ref 8–23)
CO2: 31 mmol/L (ref 22–32)
Calcium: 9.6 mg/dL (ref 8.9–10.3)
Chloride: 103 mmol/L (ref 98–111)
Creatinine: 1.13 mg/dL — ABNORMAL HIGH (ref 0.44–1.00)
GFR, Estimated: 48 mL/min — ABNORMAL LOW (ref >60)
Glucose, Bld: 126 mg/dL — ABNORMAL HIGH (ref 70–99)
Potassium: 3.8 mmol/L (ref 3.5–5.1)
Sodium: 144 mmol/L (ref 135–145)
Total Bilirubin: 0.7 mg/dL (ref 0.3–1.2)
Total Protein: 6.9 g/dL (ref 6.5–8.1)

## 2020-09-06 LAB — LACTATE DEHYDROGENASE: LDH: 196 U/L — ABNORMAL HIGH (ref 98–192)

## 2020-09-06 NOTE — Telephone Encounter (Signed)
Scheduled follow-up appointment per 5/20 los. Patient is aware. 

## 2020-09-08 ENCOUNTER — Telehealth: Payer: Self-pay | Admitting: Adult Health

## 2020-09-08 NOTE — Telephone Encounter (Signed)
I called patient to discuss Evusheld, a long acting monoclonal antibody injection administered to patients with decreased immune systems or intolerance/allergy to the COVID 19 vaccine as COVID19 prevention.    Unable to reach patient.  LMOM to return my call.  Tyann Niehaus, NP  

## 2020-09-11 ENCOUNTER — Telehealth: Payer: Self-pay | Admitting: Adult Health

## 2020-09-11 NOTE — Telephone Encounter (Signed)
I called patient to discuss Evusheld, a long acting monoclonal antibody injection administered to patients with decreased immune systems or intolerance/allergy to the COVID 19 vaccine as COVID19 prevention.    Reviewed Evusheld with patient.  She is going to think about the risks and benefits and will call back if she decides to proceed with the injection.    Wilber Bihari, NP

## 2020-09-13 ENCOUNTER — Telehealth: Payer: Self-pay

## 2020-09-13 NOTE — Telephone Encounter (Signed)
I am unfamiliar with this medication, but I did a search on [email or web address]. There has not been clear correlation established between Evuseheld and cardiac events, although cardiac events did occur in the clinical trial. Subjects who had cardiac events did have cardiac risk factors. I will defer to Dr. Grier Mitts recommendations and agree with proceeding if benefits are felt to outweigh the risks.  Thanks MJP

## 2020-09-17 ENCOUNTER — Other Ambulatory Visit: Payer: Self-pay | Admitting: Adult Health

## 2020-09-17 DIAGNOSIS — D849 Immunodeficiency, unspecified: Secondary | ICD-10-CM

## 2020-09-17 NOTE — Telephone Encounter (Signed)
Called pt to inform her about the message above. Pt understood.

## 2020-09-17 NOTE — Progress Notes (Signed)
I connected by phone with Aviva Signs on 09/17/2020, 8:56 AM to discuss the potential use of a new treatment, tixagevimab/cilgavimab, for pre-exposure prophylaxis for prevention of coronavirus disease 2019 (COVID-19) caused by the SARS-CoV-2 virus.  This patient is a 87 y.o. female that meets the FDA criteria for Emergency Use Authorization of tixagevimab/cilgavimab for pre-exposure prophylaxis of COVID-19 disease. Pt meets following criteria:  Age >12 yr and weight > 40kg  Not currently infected with SARS-CoV-2 and has no known recent exposure to an individual infected with SARS-CoV-2 AND o Who has moderate to severe immune compromise due to a medical condition or receipt of immunosuppressive medications or treatments and may not mount an adequate immune response to COVID-19 vaccination or  o Vaccination with any available COVID-19 vaccine, according to the approved or authorized schedule, is not recommended due to a history of severe adverse reaction (e.g., severe allergic reaction) to a COVID-19 vaccine(s) and/or COVID-19 vaccine component(s).  o Patient meets the following definition of mod-severe immune compromised status: 8. Other groups not previously mentioned: 4. Receiving Hydroxyurea  I have spoken and communicated the following to the patient or parent/caregiver regarding COVID monoclonal antibody treatment:  1. FDA has authorized the emergency use of tixagevimab/cilgavimab for the pre-exposure prophylaxis of COVID-19 in patients with moderate-severe immunocompromised status, who meet above EUA criteria.  2. The significant known and potential risks and benefits of COVID monoclonal antibody, and the extent to which such potential risks and benefits are unknown.  3. Information on available alternative treatments and the risks and benefits of those alternatives, including clinical trials.  4. The patient or parent/caregiver has the option to accept or refuse COVID monoclonal antibody  treatment.  After reviewing this information with the patient, agree to receive tixagevimab/cilgavimab. High priority schedule message sent.  Scot Dock, NP, 09/17/2020, 8:56 AM

## 2020-09-18 ENCOUNTER — Telehealth: Payer: Self-pay | Admitting: Hematology

## 2020-09-18 NOTE — Telephone Encounter (Signed)
Scheduled appt per 5/31 sch msg. Pt aware.

## 2020-10-02 ENCOUNTER — Other Ambulatory Visit: Payer: Self-pay | Admitting: *Deleted

## 2020-10-03 ENCOUNTER — Other Ambulatory Visit: Payer: Self-pay

## 2020-10-03 ENCOUNTER — Inpatient Hospital Stay: Payer: Medicare PPO | Attending: Hematology

## 2020-10-03 DIAGNOSIS — D849 Immunodeficiency, unspecified: Secondary | ICD-10-CM

## 2020-10-03 DIAGNOSIS — D45 Polycythemia vera: Secondary | ICD-10-CM | POA: Insufficient documentation

## 2020-10-03 DIAGNOSIS — Z298 Encounter for other specified prophylactic measures: Secondary | ICD-10-CM | POA: Insufficient documentation

## 2020-10-03 DIAGNOSIS — Z9225 Personal history of immunosupression therapy: Secondary | ICD-10-CM | POA: Insufficient documentation

## 2020-10-03 MED ORDER — TIXAGEVIMAB (PART OF EVUSHELD) INJECTION
300.0000 mg | Freq: Once | INTRAMUSCULAR | Status: AC
  Administered 2020-10-03: 300 mg via INTRAMUSCULAR
  Filled 2020-10-03: qty 3

## 2020-10-03 MED ORDER — CILGAVIMAB (PART OF EVUSHELD) INJECTION
300.0000 mg | Freq: Once | INTRAMUSCULAR | Status: AC
  Administered 2020-10-03: 300 mg via INTRAMUSCULAR
  Filled 2020-10-03: qty 3

## 2020-10-03 NOTE — Progress Notes (Signed)
Patient waited full 1 hour post injection observation time with no issues VSS upon discharge

## 2020-10-07 ENCOUNTER — Other Ambulatory Visit (HOSPITAL_COMMUNITY): Payer: Self-pay

## 2020-10-23 ENCOUNTER — Other Ambulatory Visit: Payer: Self-pay

## 2020-10-23 ENCOUNTER — Ambulatory Visit: Payer: Medicare PPO | Admitting: Cardiology

## 2020-10-23 ENCOUNTER — Encounter: Payer: Self-pay | Admitting: Cardiology

## 2020-10-23 VITALS — BP 168/71 | HR 67 | Temp 98.6°F | Resp 17 | Ht 60.0 in | Wt 100.0 lb

## 2020-10-23 DIAGNOSIS — R0989 Other specified symptoms and signs involving the circulatory and respiratory systems: Secondary | ICD-10-CM | POA: Diagnosis not present

## 2020-10-23 DIAGNOSIS — I739 Peripheral vascular disease, unspecified: Secondary | ICD-10-CM

## 2020-10-23 DIAGNOSIS — I10 Essential (primary) hypertension: Secondary | ICD-10-CM | POA: Diagnosis not present

## 2020-10-23 DIAGNOSIS — I491 Atrial premature depolarization: Secondary | ICD-10-CM | POA: Diagnosis not present

## 2020-10-23 MED ORDER — FUROSEMIDE 40 MG PO TABS: 40.0000 mg | ORAL_TABLET | Freq: Two times a day (BID) | ORAL | 3 refills | Status: DC

## 2020-10-23 MED ORDER — POTASSIUM CHLORIDE CRYS ER 20 MEQ PO TBCR: 20.0000 meq | EXTENDED_RELEASE_TABLET | Freq: Two times a day (BID) | ORAL | 2 refills | Status: DC

## 2020-10-23 NOTE — Progress Notes (Signed)
Patient referred by Haywood Pao, MD for leg edema  Subjective:   [name], female    DOB: 05/04/1932, 87 y.o.   MRN: [id number]   Chief Complaint  Patient presents with   Essential hypertension    3 MONTHS    HPI  87 y.o. Caucasian female with hypertension, CKD 3a, COPD, pulmonary hypertension, multinodular goiter w/h/o thyrotoxicosis, polycythemia vera  Patient is here for follow-up today.  She states she has "good days" and "bad days".  On "good days", she is able to work outdoors without any difficulty.  On "bad days", she feels tired during the afternoon and has to take a nap.  She denies any exertional dyspnea symptoms.  She does have leg edema, but does not affect her day-to-day activity.  In office, blood pressure elevated in office.  Reportedly, lower at home.  Patient has noted recurrent symptoms of palpitations.  She is being started on hydroxyurea by Dr. Irene Limbo for thrombocytosis.  Discussed Creacy test results with the patient, details below.  Initial consultation HPI 06/2020: Patient reports episodes of palpitations, mostly at night. Episodes have increase din frequency. Blood pressure is elevated today, was noted to be 152/80 mmHg at recent PCP visit. She denies chest pain, shortness of breath, palpitations, orthopnea, PND, TIA/syncope. She reports leg edema. She tells me she is known to have PAD, her feet stay cold, but denies any claudication symptoms.  She is seeing hematologist Dr. Irene Limbo for thrombocytosis, has upcoming blood work pending to differentiate between clonal and reactive causes.      Current Outpatient Medications on File Prior to Visit  Medication Sig Dispense Refill   aspirin 81 MG tablet Take 81 mg by mouth daily.     calcium-vitamin D 250-100 MG-UNIT per tablet Take 1 tablet by mouth 2 (two) times daily.     diltiazem (DILACOR XR) 180 MG 24 hr capsule Take 180 mg by mouth daily.     furosemide (LASIX) 20 MG tablet Take 20 mg by mouth 2  (two) times daily.     hydroxyurea (HYDREA) 500 MG capsule TAKE 1 CAPSULE BY MOUTH ONCE A DAY. MAY TAKE WITH FOOD TO MINIMIZE GI SIDE EFFECTS 30 capsule 1   loratadine (CLARITIN) 10 MG tablet Take 10 mg by mouth daily.     losartan (COZAAR) 100 MG tablet Take 1 tablet (100 mg total) by mouth daily. 90 tablet 3   methimazole (TAPAZOLE) 5 MG tablet Take 5 mg by mouth 3 (three) times daily.     psyllium (METAMUCIL) 58.6 % packet Take 1 packet by mouth daily.     timolol (TIMOPTIC) 0.5 % ophthalmic solution Place 1 drop into both eyes daily.     No current facility-administered medications on file prior to visit.    Cardiovascular and other pertinent studies:  EKG 10/23/2020: Sinus rhythm 66 bpm Occasional PAC    Left atrial enlargement  Echocardiogram 07/11/2020:  Normal LV systolic function with visual EF 60-65%. Left ventricle cavity  is normal in size. Mild left ventricular hypertrophy. Normal global wall  motion. Indeterminate diastolic filling pattern, normal  Left atrial cavity is mildly dilated.  Right atrial cavity is moderately dilated.  Mild (Grade I) aortic regurgitation.  Mild (Grade I) mitral regurgitation.  Moderate tricuspid regurgitation. Moderate pulmonary hypertension. RVSP  measures 56 mmHg.  Insignificant pericardial effusion.  IVC is dilated with a respiratory response of <50%.  No prior study for comparison.   ABI 07/11/2020:  This exam reveals normal  perfusion of the right lower extremity (ABI 1.00)  with triphasic waveform.    This exam reveals mildly decreased perfusion of the left lower extremity,  noted at the post tibial artery level (ABI 0.84) with mildly abnormal  biphasic waveform. Left AT appears to be diffusely diseased.  Mobile cardiac telemetry 7 days 06/26/2020 - 07/03/2020: Dominant rhythm: Sinus. HR 58-96 bpm. Avg HR 68 bpm, in sinus rhythm. 12 episodes of SVT/atrial tachycardia, fastest at 162 bpm for 8 beats, longest for 7 beatsat 109 bpm. 7.2%  isolated SVE, <1 couplet/triplets. <1% isolated VE, couplets No atrial fibrillation/atrial flutter//VT/high grade AV block, sinus pause >3sec noted. 10 patient triggered events, most correlated with SVE.    EKG 06/26/2020: Sinus rhythm 69 bpm Left atrial enlargement.  Old anteroseptal infarct Probable LVH   Recent labs: 09/06/2020: Glucose 126, BUN/Cr 26/1.13. EGFR 48. Na/K 144/3.8. Rest of the CMP normal H/H 13/42. MCV 105. Platelets 372 HbA1C N/A Chol 149, TG 60, HDL 74, LDL 63 TSH N/A  06/12/2020: Glucose 68, BUN/Cr 21/1.0. EGFR 52. Na/K 142/4.3. Rest of the CMP normal H/H 16/49. MCV 96. Platelets 701 HbA1C N/A Lipid panel N/A TSH 4.6 high. Free T4 0.9 normal NT pro BNP 571 normal    Review of Systems  Constitutional: Positive for malaise/fatigue.  Cardiovascular:  Positive for leg swelling. Negative for chest pain, dyspnea on exertion, palpitations and syncope.        Vitals:   10/23/20 1254 10/23/20 1300  BP: (!) 166/68 (!) 168/71  Pulse: 67 67  Resp: 17   Temp: 98.6 F (37 C)   SpO2: 97% 97%     Body mass index is 19.53 kg/m. Filed Weights   10/23/20 1254  Weight: 100 lb (45.4 kg)     Objective:   Physical Exam Vitals and nursing note reviewed.  Constitutional:      General: She is not in acute distress.    Appearance: She is well-developed.  HENT:     Head: Normocephalic and atraumatic.  Eyes:     Conjunctiva/sclera: Conjunctivae normal.     Pupils: Pupils are equal, round, and reactive to light.  Neck:     Vascular: No JVD.  Cardiovascular:     Rate and Rhythm: Normal rate and regular rhythm.     Pulses: Intact distal pulses.          Dorsalis pedis pulses are 1+ on the right side and 1+ on the left side.       Posterior tibial pulses are 0 on the right side and 0 on the left side.     Heart sounds: Murmur heard.  Holosystolic murmur is present with a grade of 3/6 at the lower right sternal border.  Pulmonary:     Effort: Pulmonary  effort is normal.     Breath sounds: Normal breath sounds. No wheezing or rales.  Abdominal:     General: Bowel sounds are normal.     Palpations: Abdomen is soft.     Tenderness: There is no rebound.  Musculoskeletal:        General: No tenderness. Normal range of motion.     Right lower leg: Edema (2+) present.     Left lower leg: Edema (2+) present.  Lymphadenopathy:     Cervical: No cervical adenopathy.  Skin:    General: Skin is warm and dry.  Neurological:     Mental Status: She is alert and oriented to person, place, and time.     Cranial Nerves:  No cranial nerve deficit.        Assessment & Recommendations:   87 y.o. Caucasian female with hypertension, CKD 3a, COPD, pulmonary hypertension, multinodular goiter w/h/o thyrotoxicosis, polycythemia vera  Palpitations:  Likely due to PACs and occasional atrial tachycardia, controlled with diltiazem.  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  She does not have any exertional dyspnea.  Her only symptoms of pulmonary hypertension or leg edema.  Increase Lasix to 40 mg twice daily, added potassium 20 M EQ twice daily.    Hypertension: Suspect component of whitecoat hypertension.  No changes made today.    PAD: Mild PAD LLE. Continue risk factor modification In absence of bleeding, okay to continue aspirin.   Not on statin, lipids well controlled.  F/u in 3 months   Nigel Mormon, MD Pager: [phone] Office: [phone]

## 2020-11-07 ENCOUNTER — Other Ambulatory Visit: Payer: Self-pay | Admitting: Hematology

## 2020-11-07 ENCOUNTER — Other Ambulatory Visit (HOSPITAL_COMMUNITY): Payer: Self-pay

## 2020-11-07 MED ORDER — HYDROXYUREA 500 MG PO CAPS
ORAL_CAPSULE | ORAL | 1 refills | Status: DC
  Filled 2020-11-07: qty 30, 30d supply, fill #0
  Filled 2020-12-09: qty 30, 30d supply, fill #1
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
## 2020-11-08 ENCOUNTER — Other Ambulatory Visit: Payer: Self-pay

## 2020-11-08 ENCOUNTER — Inpatient Hospital Stay: Payer: Medicare PPO | Attending: Hematology | Admitting: Hematology

## 2020-11-08 ENCOUNTER — Inpatient Hospital Stay: Payer: Medicare PPO

## 2020-11-08 VITALS — BP 178/67 | HR 58 | Temp 98.1°F | Resp 18 | Wt 98.4 lb

## 2020-11-08 DIAGNOSIS — Z1589 Genetic susceptibility to other disease: Secondary | ICD-10-CM | POA: Diagnosis not present

## 2020-11-08 DIAGNOSIS — M8580 Other specified disorders of bone density and structure, unspecified site: Secondary | ICD-10-CM | POA: Insufficient documentation

## 2020-11-08 DIAGNOSIS — D471 Chronic myeloproliferative disease: Secondary | ICD-10-CM

## 2020-11-08 DIAGNOSIS — D45 Polycythemia vera: Secondary | ICD-10-CM | POA: Insufficient documentation

## 2020-11-08 LAB — CMP (CANCER CENTER ONLY)
ALT: 21 U/L (ref 0–44)
AST: 26 U/L (ref 15–41)
Albumin: 4.3 g/dL (ref 3.5–5.0)
Alkaline Phosphatase: 64 U/L (ref 38–126)
Anion gap: 8 (ref 5–15)
BUN: 26 mg/dL — ABNORMAL HIGH (ref 8–23)
CO2: 33 mmol/L — ABNORMAL HIGH (ref 22–32)
Calcium: 9.9 mg/dL (ref 8.9–10.3)
Chloride: 102 mmol/L (ref 98–111)
Creatinine: 1.15 mg/dL — ABNORMAL HIGH (ref 0.44–1.00)
GFR, Estimated: 46 mL/min — ABNORMAL LOW (ref >60)
Glucose, Bld: 83 mg/dL (ref 70–99)
Potassium: 4.3 mmol/L (ref 3.5–5.1)
Sodium: 143 mmol/L (ref 135–145)
Total Bilirubin: 0.8 mg/dL (ref 0.3–1.2)
Total Protein: 6.8 g/dL (ref 6.5–8.1)

## 2020-11-08 LAB — CBC WITH DIFFERENTIAL/PLATELET
Abs Immature Granulocytes: 0.03 10*3/uL (ref 0.00–0.07)
Basophils Absolute: 0.1 10*3/uL (ref 0.0–0.1)
Basophils Relative: 1 %
Eosinophils Absolute: 0.2 10*3/uL (ref 0.0–0.5)
Eosinophils Relative: 3 %
HCT: 37.3 % (ref 36.0–46.0)
Hemoglobin: 12.7 g/dL (ref 12.0–15.0)
Immature Granulocytes: 1 %
Lymphocytes Relative: 22 %
Lymphs Abs: 1.5 10*3/uL (ref 0.7–4.0)
MCH: 39.2 pg — ABNORMAL HIGH (ref 26.0–34.0)
MCHC: 34 g/dL (ref 30.0–36.0)
MCV: 115.1 fL — ABNORMAL HIGH (ref 80.0–100.0)
Monocytes Absolute: 0.5 10*3/uL (ref 0.1–1.0)
Monocytes Relative: 8 %
Neutro Abs: 4.4 10*3/uL (ref 1.7–7.7)
Neutrophils Relative %: 65 %
Platelets: 350 10*3/uL (ref 150–400)
RBC: 3.24 MIL/uL — ABNORMAL LOW (ref 3.87–5.11)
RDW: 15.1 % (ref 11.5–15.5)
WBC: 6.6 10*3/uL (ref 4.0–10.5)
nRBC: 0 % (ref 0.0–0.2)

## 2020-11-20 DIAGNOSIS — H5212 Myopia, left eye: Secondary | ICD-10-CM | POA: Diagnosis not present

## 2020-11-20 DIAGNOSIS — H2513 Age-related nuclear cataract, bilateral: Secondary | ICD-10-CM | POA: Diagnosis not present

## 2020-11-20 DIAGNOSIS — H524 Presbyopia: Secondary | ICD-10-CM | POA: Diagnosis not present

## 2020-11-20 DIAGNOSIS — I10 Essential (primary) hypertension: Secondary | ICD-10-CM | POA: Diagnosis not present

## 2020-11-20 DIAGNOSIS — H52223 Regular astigmatism, bilateral: Secondary | ICD-10-CM | POA: Diagnosis not present

## 2020-12-09 ENCOUNTER — Other Ambulatory Visit (HOSPITAL_COMMUNITY): Payer: Self-pay

## 2020-12-18 DIAGNOSIS — M810 Age-related osteoporosis without current pathological fracture: Secondary | ICD-10-CM | POA: Diagnosis not present

## 2020-12-18 DIAGNOSIS — Z0000 Encounter for general adult medical examination without abnormal findings: Secondary | ICD-10-CM | POA: Diagnosis not present

## 2020-12-18 DIAGNOSIS — E0520 Thyrotoxicosis with toxic multinodular goiter without thyrotoxic crisis or storm: Secondary | ICD-10-CM | POA: Diagnosis not present

## 2020-12-18 DIAGNOSIS — I129 Hypertensive chronic kidney disease with stage 1 through stage 4 chronic kidney disease, or unspecified chronic kidney disease: Secondary | ICD-10-CM | POA: Diagnosis not present

## 2020-12-25 DIAGNOSIS — R82998 Other abnormal findings in urine: Secondary | ICD-10-CM | POA: Diagnosis not present

## 2020-12-25 DIAGNOSIS — I272 Pulmonary hypertension, unspecified: Secondary | ICD-10-CM | POA: Diagnosis not present

## 2020-12-25 DIAGNOSIS — D692 Other nonthrombocytopenic purpura: Secondary | ICD-10-CM | POA: Diagnosis not present

## 2020-12-25 DIAGNOSIS — Z1389 Encounter for screening for other disorder: Secondary | ICD-10-CM | POA: Diagnosis not present

## 2020-12-25 DIAGNOSIS — I739 Peripheral vascular disease, unspecified: Secondary | ICD-10-CM | POA: Diagnosis not present

## 2020-12-25 DIAGNOSIS — Z23 Encounter for immunization: Secondary | ICD-10-CM | POA: Diagnosis not present

## 2020-12-25 DIAGNOSIS — J449 Chronic obstructive pulmonary disease, unspecified: Secondary | ICD-10-CM | POA: Diagnosis not present

## 2020-12-25 DIAGNOSIS — E0520 Thyrotoxicosis with toxic multinodular goiter without thyrotoxic crisis or storm: Secondary | ICD-10-CM | POA: Diagnosis not present

## 2020-12-25 DIAGNOSIS — Z1331 Encounter for screening for depression: Secondary | ICD-10-CM | POA: Diagnosis not present

## 2020-12-25 DIAGNOSIS — I129 Hypertensive chronic kidney disease with stage 1 through stage 4 chronic kidney disease, or unspecified chronic kidney disease: Secondary | ICD-10-CM | POA: Diagnosis not present

## 2020-12-25 DIAGNOSIS — N1831 Chronic kidney disease, stage 3a: Secondary | ICD-10-CM | POA: Diagnosis not present

## 2020-12-25 DIAGNOSIS — M810 Age-related osteoporosis without current pathological fracture: Secondary | ICD-10-CM | POA: Diagnosis not present

## 2020-12-25 DIAGNOSIS — Z0000 Encounter for general adult medical examination without abnormal findings: Secondary | ICD-10-CM | POA: Diagnosis not present

## 2021-01-09 ENCOUNTER — Other Ambulatory Visit (HOSPITAL_COMMUNITY): Payer: Self-pay

## 2021-01-09 ENCOUNTER — Other Ambulatory Visit: Payer: Self-pay | Admitting: Hematology

## 2021-01-09 MED ORDER — HYDROXYUREA 500 MG PO CAPS
ORAL_CAPSULE | ORAL | 1 refills | Status: DC
  Filled 2021-01-09: qty 30, 30d supply, fill #0
  Filled 2021-02-19: qty 30, 30d supply, fill #1

## 2021-01-10 ENCOUNTER — Inpatient Hospital Stay: Payer: Medicare PPO | Attending: Hematology | Admitting: Hematology

## 2021-01-10 ENCOUNTER — Other Ambulatory Visit: Payer: Self-pay

## 2021-01-10 ENCOUNTER — Inpatient Hospital Stay: Payer: Medicare PPO

## 2021-01-10 VITALS — BP 140/53 | HR 68 | Temp 97.8°F | Resp 16 | Ht 60.0 in | Wt 98.7 lb

## 2021-01-10 DIAGNOSIS — D45 Polycythemia vera: Secondary | ICD-10-CM | POA: Diagnosis not present

## 2021-01-10 DIAGNOSIS — D471 Chronic myeloproliferative disease: Secondary | ICD-10-CM

## 2021-01-10 DIAGNOSIS — Z1589 Genetic susceptibility to other disease: Secondary | ICD-10-CM

## 2021-01-10 LAB — CBC WITH DIFFERENTIAL (CANCER CENTER ONLY)
Abs Immature Granulocytes: 0.02 10*3/uL (ref 0.00–0.07)
Basophils Absolute: 0.1 10*3/uL (ref 0.0–0.1)
Basophils Relative: 1 %
Eosinophils Absolute: 0.2 10*3/uL (ref 0.0–0.5)
Eosinophils Relative: 4 %
HCT: 37.5 % (ref 36.0–46.0)
Hemoglobin: 12.5 g/dL (ref 12.0–15.0)
Immature Granulocytes: 0 %
Lymphocytes Relative: 24 %
Lymphs Abs: 1.4 10*3/uL (ref 0.7–4.0)
MCH: 39.7 pg — ABNORMAL HIGH (ref 26.0–34.0)
MCHC: 33.3 g/dL (ref 30.0–36.0)
MCV: 119 fL — ABNORMAL HIGH (ref 80.0–100.0)
Monocytes Absolute: 0.7 10*3/uL (ref 0.1–1.0)
Monocytes Relative: 12 %
Neutro Abs: 3.5 10*3/uL (ref 1.7–7.7)
Neutrophils Relative %: 59 %
Platelet Count: 310 10*3/uL (ref 150–400)
RBC: 3.15 MIL/uL — ABNORMAL LOW (ref 3.87–5.11)
RDW: 13.2 % (ref 11.5–15.5)
WBC Count: 5.9 10*3/uL (ref 4.0–10.5)
nRBC: 0 % (ref 0.0–0.2)

## 2021-01-10 LAB — CMP (CANCER CENTER ONLY)
ALT: 20 U/L (ref 0–44)
AST: 28 U/L (ref 15–41)
Albumin: 4.1 g/dL (ref 3.5–5.0)
Alkaline Phosphatase: 82 U/L (ref 38–126)
Anion gap: 12 (ref 5–15)
BUN: 25 mg/dL — ABNORMAL HIGH (ref 8–23)
CO2: 31 mmol/L (ref 22–32)
Calcium: 9.7 mg/dL (ref 8.9–10.3)
Chloride: 101 mmol/L (ref 98–111)
Creatinine: 1.25 mg/dL — ABNORMAL HIGH (ref 0.44–1.00)
GFR, Estimated: 42 mL/min — ABNORMAL LOW (ref >60)
Glucose, Bld: 120 mg/dL — ABNORMAL HIGH (ref 70–99)
Potassium: 3.9 mmol/L (ref 3.5–5.1)
Sodium: 144 mmol/L (ref 135–145)
Total Bilirubin: 0.7 mg/dL (ref 0.3–1.2)
Total Protein: 7.1 g/dL (ref 6.5–8.1)

## 2021-01-10 LAB — LACTATE DEHYDROGENASE: LDH: 207 U/L — ABNORMAL HIGH (ref 98–192)

## 2021-01-16 NOTE — Progress Notes (Signed)
HEMATOLOGY/ONCOLOGY CLINIC NOTE  Date of Service: .01/10/2021   Patient Care Team: [name], Fransico Him, MD as PCP - General (Internal Medicine)  CHIEF COMPLAINTS/PURPOSE OF CONSULTATION:  Polycythemia Vera  HISTORY OF PRESENTING ILLNESS:   [name] is a wonderful 87 y.o. female who has been referred to [place] by Dr. Domenick Gong, MD for evaluation and management of elevated Plt count. The pt reports that she is doing well overall. We are joined today by her husband, [name].  The pt had labwork, 06/12/2020 of CBC had a Plt of 701K.   The pt reports that this is the first time she has noticed her elevated Plt count. She notes her Plt have been increasing since 2018, but were normal. She notes they were slightly elevated in 2020 and have been the highest before when she got labs done recently last month. The pt notes that she did experience a severe injury and bruising one year ago in an accident with a wood splitter. There has not been any other recent injuries or infections. The pt denies any recent reasons for bleeding. The pt notes post-nasal drip, a minor cough, and a toxic goiter. She notes she has been the healthiest and had the least infection issues since COVID and the use of masks. The pt notes there were no blood clots with her injury from an [place] on 07/06/2019, and the bruising and swelling has improved on surface level. She notes intermittent sting an pain in the leg.  The pt denies any history of smoking. She also denies any new symptoms that are acute. The pt notes she has not been feeling her old baseline, but notes no issues acutely or unexplained changes in how she has felt. The pt is eating well. The pt denies any history of blood clots, heart attacks, strokes. The pt notes she has an appointment with a Cardiologist this week in two days. She notes a weak beat and some irregular heartbeats. She has been more aware of her heartbeats recently and has been swelling in her feet  more. The pt notes she also has Pulmonary Hypertension, but was never aware of the cause of this. The pt still has her spleen. The pt notes some new crippling in her left hand and continued in her right. The pt notes some hip pain and denies any history of gout. The pt notes her last thyroid levels were checked on 06/12/2020 with other blood counts and there were no concerns.   On review of systems, pt reports leg swelling and denies fevers, chills, sudden unexpected weight loss, SOB, chest pain, back pain, changes in bowel habits, and any other symptoms.   INTERVAL HISTORY  [name] is a wonderful 87 y.o. female who is here today for evaluation and management of Polycythemia Vera. The patient's last visit with [place] was on 11/08/2020. The pt reports that she is doing well overall.  The pt reports no problems tolerating the hydrea.  Lab results today 01/10/2021 of CBC w/diff shows normal hemoglobin of 12.5 with a WBC count of 5.9 k and platelets of 310 k and CMP is stable LDH upper limits of normal stable.  On review of systems, pt reports no other symptoms.   MEDICAL HISTORY:  Past Medical History:  Diagnosis Date   Osteopenia    Thyroid disease    Varicose veins VENOUS INSUFFICIENCY SLOW HEALING OF WOUNDS Charissa Bash TO LOWER EXTEMETIES   Venous stasis dermatitis     SURGICAL HISTORY: Past  Surgical History:  Procedure Laterality Date   BREAST CYST ASPIRATION Left    EYE SURGERY     TONSILLECTOMY      SOCIAL HISTORY: Social History   Socioeconomic History   Marital status: Married    Spouse name: Not on file   Number of children: 2   Years of education: Not on file   Highest education level: Not on file  Occupational History   Not on file  Tobacco Use   Smoking status: Never   Smokeless tobacco: Never  Vaping Use   Vaping Use: Never used  Substance and Sexual Activity   Alcohol use: No   Drug use: Never   Sexual activity: Not on file  Other Topics Concern    Not on file  Social History Narrative   Not on file   Social Determinants of Health   Financial Resource Strain: Not on file  Food Insecurity: Not on file  Transportation Needs: Not on file  Physical Activity: Not on file  Stress: Not on file  Social Connections: Not on file  Intimate Partner Violence: Not on file    FAMILY HISTORY: Family History  Problem Relation Age of Onset   Heart disease Father    Cancer Son    Cancer Maternal Aunt    Cancer Paternal Uncle    Cancer Maternal Grandfather    Breast cancer Neg Hx     ALLERGIES:  is allergic to doxycycline calcium and penicillin v.  MEDICATIONS:  Current Outpatient Medications  Medication Sig Dispense Refill   aspirin 81 MG tablet Take 81 mg by mouth daily.     b complex vitamins capsule Take 1 capsule by mouth daily.     calcium-vitamin D 250-100 MG-UNIT per tablet Take 1 tablet by mouth 2 (two) times daily.     diltiazem (DILACOR XR) 180 MG 24 hr capsule Take 180 mg by mouth daily.     furosemide (LASIX) 40 MG tablet Take 1 tablet (40 mg total) by mouth 2 (two) times daily. 120 tablet 3   hydroxyurea (HYDREA) 500 MG capsule TAKE 1 CAPSULE BY MOUTH ONCE A DAY. MAY TAKE WITH FOOD TO MINIMIZE GI SIDE EFFECTS 30 capsule 1   loratadine (CLARITIN) 10 MG tablet Take 10 mg by mouth daily.     losartan (COZAAR) 100 MG tablet Take 1 tablet (100 mg total) by mouth daily. 90 tablet 3   methimazole (TAPAZOLE) 5 MG tablet Take 5 mg by mouth daily.     potassium chloride SA (KLOR-CON) 20 MEQ tablet Take 1 tablet (20 mEq total) by mouth 2 (two) times daily. 120 tablet 2   psyllium (METAMUCIL) 58.6 % packet Take 1 packet by mouth daily.     timolol (TIMOPTIC) 0.5 % ophthalmic solution Place 1 drop into both eyes daily.     No current facility-administered medications for this visit.    REVIEW OF SYSTEMS:   .10 Point review of Systems was done is negative except as noted above.   PHYSICAL EXAMINATION: ECOG PERFORMANCE STATUS: 1  - Symptomatic but completely ambulatory  . Vitals:   01/10/21 1247  BP: (!) 140/53  Pulse: 68  Resp: 16  Temp: 97.8 F (36.6 C)  SpO2: 100%    Filed Weights   01/10/21 1247  Weight: 98 lb 11.2 oz (44.8 kg)    .Body mass index is 19.28 kg/m. Marland Kitchen GENERAL:alert, in no acute distress and comfortable SKIN: no acute rashes, no significant lesions EYES: conjunctiva are pink and non-injected,  sclera anicteric OROPHARYNX: MMM, no exudates, no oropharyngeal erythema or ulceration NECK: supple, no JVD LYMPH:  no palpable lymphadenopathy in the cervical, axillary or inguinal regions LUNGS: clear to auscultation b/l with normal respiratory effort HEART: regular rate & rhythm ABDOMEN:  normoactive bowel sounds , non tender, not distended. Extremity: no pedal edema PSYCH: alert & oriented x 3 with fluent speech NEURO: no focal motor/sensory deficits     LABORATORY DATA:  I have reviewed the data as listed  CBC Latest Ref Rng & Units 01/10/2021 11/08/2020 09/06/2020  WBC 4.0 - 10.5 K/uL 5.9 6.6 7.9  Hemoglobin 12.0 - 15.0 g/dL 12.5 12.7 13.9  Hematocrit 36.0 - 46.0 % 37.5 37.3 42.2  Platelets 150 - 400 K/uL 310 350 372    . CMP Latest Ref Rng & Units 01/10/2021 11/08/2020 09/06/2020  Glucose 70 - 99 mg/dL 120(H) 83 126(H)  BUN 8 - 23 mg/dL 25(H) 26(H) 26(H)  Creatinine 0.44 - 1.00 mg/dL 1.25(H) 1.15(H) 1.13(H)  Sodium 135 - 145 mmol/L 144 143 144  Potassium 3.5 - 5.1 mmol/L 3.9 4.3 3.8  Chloride 98 - 111 mmol/L 101 102 103  CO2 22 - 32 mmol/L 31 33(H) 31  Calcium 8.9 - 10.3 mg/dL 9.7 9.9 9.6  Total Protein 6.5 - 8.1 g/dL 7.1 6.8 6.9  Total Bilirubin 0.3 - 1.2 mg/dL 0.7 0.8 0.7  Alkaline Phos 38 - 126 U/L 82 64 81  AST 15 - 41 U/L 28 26 26   ALT 0 - 44 U/L 20 21 17    . Lab Results  Component Value Date   LDH 207 (H) 01/10/2021      RADIOGRAPHIC STUDIES: I have personally reviewed the radiological images as listed and agreed with the findings in the report. No results  found.  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 PLT at goal -Recommended pt drink 48-64 oz water daily, eat healthy, and sleep well. -Will continue with same dosage of 500 mg Hydroxyurea daily at this time. -Avoid Iron supplements. -Recommended pt continue Vitamin B-Complex daily. -Continue ASA. -Recommended patient get her yearly flu shot and the new COVID-19 booster vaccine -Will see back in 28months   FOLLOW UP: RTC with Dr Irene Limbo with labs in 2 months  No orders of the defined types were placed in this encounter.   All of the patients questions were answered with apparent satisfaction. The patient knows to call the clinic with any problems, questions or concerns.  . The total time spent in the appointment was 20 minutes and more than 50% was on counseling and direct patient cares.   Sullivan Lone MD Zap AAHIVMS [facility] Hematology/Oncology Physician [facility]

## 2021-01-23 ENCOUNTER — Ambulatory Visit: Payer: Medicare PPO

## 2021-01-23 ENCOUNTER — Other Ambulatory Visit: Payer: Self-pay

## 2021-01-23 DIAGNOSIS — I6523 Occlusion and stenosis of bilateral carotid arteries: Secondary | ICD-10-CM | POA: Diagnosis not present

## 2021-01-23 DIAGNOSIS — I739 Peripheral vascular disease, unspecified: Secondary | ICD-10-CM

## 2021-01-23 DIAGNOSIS — R0602 Shortness of breath: Secondary | ICD-10-CM | POA: Diagnosis not present

## 2021-01-23 DIAGNOSIS — I150 Renovascular hypertension: Secondary | ICD-10-CM | POA: Diagnosis not present

## 2021-01-29 DIAGNOSIS — L821 Other seborrheic keratosis: Secondary | ICD-10-CM | POA: Diagnosis not present

## 2021-01-29 DIAGNOSIS — L570 Actinic keratosis: Secondary | ICD-10-CM | POA: Diagnosis not present

## 2021-01-29 DIAGNOSIS — X32XXXD Exposure to sunlight, subsequent encounter: Secondary | ICD-10-CM | POA: Diagnosis not present

## 2021-01-31 ENCOUNTER — Other Ambulatory Visit: Payer: Self-pay

## 2021-01-31 ENCOUNTER — Ambulatory Visit: Payer: Medicare PPO | Admitting: Cardiology

## 2021-01-31 ENCOUNTER — Encounter: Payer: Self-pay | Admitting: Cardiology

## 2021-01-31 VITALS — BP 166/62 | HR 65 | Temp 98.6°F | Resp 17 | Ht 60.0 in | Wt 102.0 lb

## 2021-01-31 DIAGNOSIS — I739 Peripheral vascular disease, unspecified: Secondary | ICD-10-CM

## 2021-01-31 DIAGNOSIS — I10 Essential (primary) hypertension: Secondary | ICD-10-CM

## 2021-01-31 DIAGNOSIS — I272 Pulmonary hypertension, unspecified: Secondary | ICD-10-CM

## 2021-01-31 NOTE — Progress Notes (Signed)
Patient referred by Haywood Pao, MD for leg edema  Subjective:   [name], female    DOB: 27-Aug-1932, 87 y.o.   MRN: [id number]   Chief Complaint  Patient presents with   Follow-up   pulmonary hyertension    HPI  87 y.o. Caucasian female with hypertension, CKD 3a, COPD, pulmonary hypertension, multinodular goiter w/h/o thyrotoxicosis, polycythemia vera  Patient is here for follow-up today. She continues to be active with walking 20 min daily. She also performs aerobic and yoga exercises, by following a tele program. With her level   Initial consultation HPI 06/2020: Patient reports episodes of palpitations, mostly at night. Episodes have increase din frequency. Blood pressure is elevated today, was noted to be 152/80 mmHg at recent PCP visit. She denies chest pain, shortness of breath, palpitations, orthopnea, PND, TIA/syncope. She reports leg edema. She tells me she is known to have PAD, her feet stay cold, but denies any claudication symptoms.  She is seeing hematologist Dr. Irene Limbo for thrombocytosis, has upcoming blood work pending to differentiate between clonal and reactive causes.      Current Outpatient Medications on File Prior to Visit  Medication Sig Dispense Refill   aspirin 81 MG tablet Take 81 mg by mouth daily.     b complex vitamins capsule Take 1 capsule by mouth daily.     calcium-vitamin D 250-100 MG-UNIT per tablet Take 1 tablet by mouth 2 (two) times daily.     diltiazem (DILACOR XR) 180 MG 24 hr capsule Take 180 mg by mouth daily.     furosemide (LASIX) 40 MG tablet Take 1 tablet (40 mg total) by mouth 2 (two) times daily. 120 tablet 3   hydroxyurea (HYDREA) 500 MG capsule TAKE 1 CAPSULE BY MOUTH ONCE A DAY. MAY TAKE WITH FOOD TO MINIMIZE GI SIDE EFFECTS 30 capsule 1   loratadine (CLARITIN) 10 MG tablet Take 10 mg by mouth daily.     losartan (COZAAR) 100 MG tablet Take 1 tablet (100 mg total) by mouth daily. 90 tablet 3   methimazole (TAPAZOLE)  5 MG tablet Take 5 mg by mouth daily.     potassium chloride SA (KLOR-CON) 20 MEQ tablet Take 1 tablet (20 mEq total) by mouth 2 (two) times daily. 120 tablet 2   psyllium (METAMUCIL) 58.6 % packet Take 1 packet by mouth daily.     timolol (TIMOPTIC) 0.5 % ophthalmic solution Place 1 drop into both eyes daily.     No current facility-administered medications on file prior to visit.    Cardiovascular and other pertinent studies:  Echocardiogram 01/23/2021:  Normal LV systolic function with EF 56%. Left ventricle cavity is small.  Normal left ventricular wall thickness. Normal global wall motion. Normal  diastolic filling pattern, normal LAP. Calculated EF 56%.  Right atrial cavity is severely dilated.  Right ventricle cavity is moderately dilated. Normal right ventricular  function.  Trileaflet aortic valve. No evidence of aortic stenosis. Trace aortic  regurgitation.  Severe tricuspid regurgitation. Dilation of the tricuspid valve annulus.  Moderate pulmonary hypertension. RVSP measures 59 mmHg.  IVC is dilated with poor inspiration collapse consistent with elevated  right atrial pressure (estimated 15 mm Hg).  Compared to the study done on 07/11/2020, previously right atrium was only  moderately dilated and tricuspid regurgitation was moderate.  Right  ventricular dilatation is new, no significant change in RV systolic  pressure.  EKG 10/23/2020: Sinus rhythm 66 bpm Occasional PAC    Left atrial  enlargement  ABI 07/11/2020:  This exam reveals normal perfusion of the right lower extremity (ABI 1.00)  with triphasic waveform.    This exam reveals mildly decreased perfusion of the left lower extremity,  noted at the post tibial artery level (ABI 0.84) with mildly abnormal  biphasic waveform. Left AT appears to be diffusely diseased.  Mobile cardiac telemetry 7 days 06/26/2020 - 07/03/2020: Dominant rhythm: Sinus. HR 58-96 bpm. Avg HR 68 bpm, in sinus rhythm. 12 episodes of SVT/atrial  tachycardia, fastest at 162 bpm for 8 beats, longest for 7 beatsat 109 bpm. 7.2% isolated SVE, <1 couplet/triplets. <1% isolated VE, couplets No atrial fibrillation/atrial flutter//VT/high grade AV block, sinus pause >3sec noted. 10 patient triggered events, most correlated with SVE.    EKG 06/26/2020: Sinus rhythm 69 bpm Left atrial enlargement.  Old anteroseptal infarct Probable LVH   Recent labs: 09/06/2020: Glucose 126, BUN/Cr 26/1.13. EGFR 48. Na/K 144/3.8. Rest of the CMP normal H/H 13/42. MCV 105. Platelets 372 HbA1C N/A Chol 149, TG 60, HDL 74, LDL 63 TSH N/A  06/12/2020: Glucose 68, BUN/Cr 21/1.0. EGFR 52. Na/K 142/4.3. Rest of the CMP normal H/H 16/49. MCV 96. Platelets 701 HbA1C N/A Lipid panel N/A TSH 4.6 high. Free T4 0.9 normal NT pro BNP 571 normal    Review of Systems  Constitutional: Positive for malaise/fatigue.  Cardiovascular:  Positive for leg swelling. Negative for chest pain, dyspnea on exertion, palpitations and syncope.        Vitals:   01/31/21 1254 01/31/21 1301  BP: (!) 182/72 (!) 166/62  Pulse: 69 65  Resp: 17   Temp: 98.6 F (37 C)   SpO2: 97% 96%     Body mass index is 19.92 kg/m. Filed Weights   01/31/21 1254  Weight: 102 lb (46.3 kg)     Objective:   Physical Exam Vitals and nursing note reviewed.  Constitutional:      General: She is not in acute distress.    Appearance: She is well-developed.  HENT:     Head: Normocephalic and atraumatic.  Eyes:     Conjunctiva/sclera: Conjunctivae normal.     Pupils: Pupils are equal, round, and reactive to light.  Neck:     Vascular: No JVD.  Cardiovascular:     Rate and Rhythm: Normal rate and regular rhythm.     Pulses: Intact distal pulses.          Dorsalis pedis pulses are 1+ on the right side and 1+ on the left side.       Posterior tibial pulses are 0 on the right side and 0 on the left side.     Heart sounds: Murmur heard.  Holosystolic murmur is present with a grade  of 3/6 at the lower right sternal border.  Pulmonary:     Effort: Pulmonary effort is normal.     Breath sounds: Normal breath sounds. No wheezing or rales.  Abdominal:     General: Bowel sounds are normal.     Palpations: Abdomen is soft.     Tenderness: There is no rebound.  Musculoskeletal:        General: No tenderness. Normal range of motion.     Right lower leg: Edema (2+) present.     Left lower leg: Edema (2+) present.  Lymphadenopathy:     Cervical: No cervical adenopathy.  Skin:    General: Skin is warm and dry.  Neurological:     Mental Status: She is alert and oriented to person,  place, and time.     Cranial Nerves: No cranial nerve deficit.        Assessment & Recommendations:   87 y.o. Caucasian female with hypertension, CKD 3a, ?COPD, pulmonary hypertension, multinodular goiter w/h/o thyrotoxicosis, polycythemia vera  Palpitations:  Likely due to PACs and occasional atrial tachycardia, controlled with diltiazem.  Pulmonary hypertension: Longstanding pulmonary hypertension. Today, she denies any h/o COPD. I wonder if her PH is WHO grp I or II. Other than edema, she remains fairly asymptomatic. She has developed RA and RV dilatation, although RV systolic function remains intact. I will perform a 6 min walk test to truly assess her walking capacity and then discuss any further workup and management for her PH.  Hypertension: Suspect component of whitecoat hypertension.  She reportedly has had comparable blood pressure readings on home monitor and physician BP monitor. BP is higher in physician office, but much mower at home. No changes made today.    PAD: Mild PAD LLE. Continue risk factor modification In absence of bleeding, okay to continue aspirin.   Not on statin, lipids well controlled.  6 min walk test next week Echocardiogram in 6 months    Nigel Mormon, MD Pager: [phone] Office: [phone]

## 2021-02-04 ENCOUNTER — Other Ambulatory Visit: Payer: Self-pay

## 2021-02-04 ENCOUNTER — Ambulatory Visit: Payer: Medicare PPO | Admitting: Cardiology

## 2021-02-04 DIAGNOSIS — I272 Pulmonary hypertension, unspecified: Secondary | ICD-10-CM

## 2021-02-04 DIAGNOSIS — I279 Pulmonary heart disease, unspecified: Secondary | ICD-10-CM | POA: Diagnosis not present

## 2021-02-06 NOTE — Progress Notes (Signed)
Six Minute Walk - 02/04/21 1056       Six Minute Walk   Lap distance in meters  20 meters    Laps Completed  18    Baseline BP (sitting) 170/81    Baseline Heartrate 71    Baseline SPO2 100 %      Interval Oxygen Saturation and HR    2 Minute Oxygen Saturation % 100 %    2 Minute HR 83    4 Minute Oxygen Saturation % 100 %    4 Minute HR 89    6 Minute Oxygen Saturation % 100 %    6 Minute HR 89      End of Test Values    BP (sitting) 190/91    Heartrate 67    SPO2 100 %      2 Minutes Post Walk Values   Stopped or paused before six minutes? No            Did well on 6 MWT with total distance of 360 m. Recommend repeat echocardiogram in 6 months. Follow up in 3 months.   Nigel Mormon, MD Pager: [phone] Office: [phone]

## 2021-02-19 ENCOUNTER — Other Ambulatory Visit (HOSPITAL_COMMUNITY): Payer: Self-pay

## 2021-02-20 ENCOUNTER — Other Ambulatory Visit (HOSPITAL_COMMUNITY): Payer: Self-pay

## 2021-02-25 DIAGNOSIS — Z20828 Contact with and (suspected) exposure to other viral communicable diseases: Secondary | ICD-10-CM | POA: Diagnosis not present

## 2021-02-25 DIAGNOSIS — R509 Fever, unspecified: Secondary | ICD-10-CM | POA: Diagnosis not present

## 2021-02-25 DIAGNOSIS — R0989 Other specified symptoms and signs involving the circulatory and respiratory systems: Secondary | ICD-10-CM | POA: Diagnosis not present

## 2021-02-25 DIAGNOSIS — R051 Acute cough: Secondary | ICD-10-CM | POA: Diagnosis not present

## 2021-03-19 ENCOUNTER — Other Ambulatory Visit: Payer: Self-pay | Admitting: Hematology

## 2021-03-19 ENCOUNTER — Other Ambulatory Visit (HOSPITAL_COMMUNITY): Payer: Self-pay

## 2021-03-19 MED ORDER — HYDROXYUREA 500 MG PO CAPS
ORAL_CAPSULE | ORAL | 1 refills | Status: DC
  Filled 2021-03-19: qty 30, 30d supply, fill #0
  Filled 2021-04-26: qty 30, 30d supply, fill #1

## 2021-03-20 ENCOUNTER — Other Ambulatory Visit: Payer: Self-pay | Admitting: *Deleted

## 2021-03-20 DIAGNOSIS — D471 Chronic myeloproliferative disease: Secondary | ICD-10-CM

## 2021-03-20 DIAGNOSIS — D849 Immunodeficiency, unspecified: Secondary | ICD-10-CM

## 2021-03-20 DIAGNOSIS — Z1589 Genetic susceptibility to other disease: Secondary | ICD-10-CM

## 2021-03-21 ENCOUNTER — Other Ambulatory Visit: Payer: Self-pay

## 2021-03-21 ENCOUNTER — Inpatient Hospital Stay: Payer: Medicare PPO | Attending: Hematology

## 2021-03-21 ENCOUNTER — Inpatient Hospital Stay: Payer: Medicare PPO | Admitting: Hematology

## 2021-03-21 VITALS — BP 172/59 | HR 63 | Temp 97.7°F | Resp 16 | Ht 60.0 in | Wt 99.6 lb

## 2021-03-21 DIAGNOSIS — D471 Chronic myeloproliferative disease: Secondary | ICD-10-CM

## 2021-03-21 DIAGNOSIS — D45 Polycythemia vera: Secondary | ICD-10-CM | POA: Insufficient documentation

## 2021-03-21 DIAGNOSIS — D7589 Other specified diseases of blood and blood-forming organs: Secondary | ICD-10-CM | POA: Diagnosis not present

## 2021-03-21 DIAGNOSIS — Z1589 Genetic susceptibility to other disease: Secondary | ICD-10-CM

## 2021-03-21 DIAGNOSIS — D849 Immunodeficiency, unspecified: Secondary | ICD-10-CM

## 2021-03-21 LAB — CBC WITH DIFFERENTIAL (CANCER CENTER ONLY)
Abs Immature Granulocytes: 0.02 10*3/uL (ref 0.00–0.07)
Basophils Absolute: 0.1 10*3/uL (ref 0.0–0.1)
Basophils Relative: 1 %
Eosinophils Absolute: 0.2 10*3/uL (ref 0.0–0.5)
Eosinophils Relative: 3 %
HCT: 40.4 % (ref 36.0–46.0)
Hemoglobin: 13.1 g/dL (ref 12.0–15.0)
Immature Granulocytes: 0 %
Lymphocytes Relative: 22 %
Lymphs Abs: 1.4 10*3/uL (ref 0.7–4.0)
MCH: 38.2 pg — ABNORMAL HIGH (ref 26.0–34.0)
MCHC: 32.4 g/dL (ref 30.0–36.0)
MCV: 117.8 fL — ABNORMAL HIGH (ref 80.0–100.0)
Monocytes Absolute: 0.7 10*3/uL (ref 0.1–1.0)
Monocytes Relative: 11 %
Neutro Abs: 4 10*3/uL (ref 1.7–7.7)
Neutrophils Relative %: 63 %
Platelet Count: 328 10*3/uL (ref 150–400)
RBC: 3.43 MIL/uL — ABNORMAL LOW (ref 3.87–5.11)
RDW: 13.6 % (ref 11.5–15.5)
WBC Count: 6.4 10*3/uL (ref 4.0–10.5)
nRBC: 0 % (ref 0.0–0.2)

## 2021-03-21 LAB — CMP (CANCER CENTER ONLY)
ALT: 22 U/L (ref 0–44)
AST: 28 U/L (ref 15–41)
Albumin: 4.1 g/dL (ref 3.5–5.0)
Alkaline Phosphatase: 93 U/L (ref 38–126)
Anion gap: 9 (ref 5–15)
BUN: 19 mg/dL (ref 8–23)
CO2: 30 mmol/L (ref 22–32)
Calcium: 9.3 mg/dL (ref 8.9–10.3)
Chloride: 104 mmol/L (ref 98–111)
Creatinine: 1.11 mg/dL — ABNORMAL HIGH (ref 0.44–1.00)
GFR, Estimated: 48 mL/min — ABNORMAL LOW (ref >60)
Glucose, Bld: 80 mg/dL (ref 70–99)
Potassium: 4 mmol/L (ref 3.5–5.1)
Sodium: 143 mmol/L (ref 135–145)
Total Bilirubin: 0.7 mg/dL (ref 0.3–1.2)
Total Protein: 7.2 g/dL (ref 6.5–8.1)

## 2021-03-21 LAB — LACTATE DEHYDROGENASE: LDH: 181 U/L (ref 98–192)

## 2021-03-27 NOTE — Progress Notes (Addendum)
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 Her last clinic visit with [place] was on 01/10/2021.  She has been following with her primary care physician Dr. Vernell Leep for her PAD, hypertension and pulmonary hypertension.    Patient notes that she has been continuing to tolerate her current dose of Hydrea 5 mg p.o. daily without any overt toxicities. No fevers no chills no night sweats.  No new leg swelling. No new skin rashes. Labs done today and reviewed in detail with the patient.  MEDICAL HISTORY:  Past Medical History:  Diagnosis Date   Osteopenia    Thyroid disease    Varicose veins VENOUS INSUFFICIENCY SLOW HEALING OF WOUNDS Charissa Bash TO LOWER EXTEMETIES   Venous stasis dermatitis     SURGICAL HISTORY: Past Surgical History:  Procedure Laterality Date   BREAST CYST ASPIRATION Left    EYE SURGERY     TONSILLECTOMY      SOCIAL HISTORY: Social History   Socioeconomic History   Marital status: Married    Spouse name: Not on file   Number of children: 2   Years of education: Not on file   Highest education level: Not on file  Occupational History   Not on file  Tobacco Use   Smoking status: Never   Smokeless tobacco: Never  Vaping Use   Vaping Use: Never used  Substance and Sexual Activity   Alcohol use: No   Drug use: Never   Sexual activity: Not on file  Other Topics Concern   Not on file  Social History Narrative   Not on file   Social Determinants of Health   Financial Resource Strain: Not on file  Food  Insecurity: Not on file  Transportation Needs: Not on file  Physical Activity: Not on file  Stress: Not on file  Social Connections: Not on file  Intimate Partner Violence: Not on file    FAMILY HISTORY: Family History  Problem Relation Age of Onset   Heart disease Father    Cancer Son    Cancer Maternal Aunt    Cancer Paternal Uncle    Cancer Maternal Grandfather    Breast cancer Neg Hx     ALLERGIES:  is allergic to doxycycline calcium and penicillin v.  MEDICATIONS:  Current Outpatient Medications  Medication Sig Dispense Refill   aspirin 81 MG tablet Take 81 mg by mouth daily.     b complex vitamins capsule Take 1 capsule by mouth daily.     calcium-vitamin D 250-100 MG-UNIT per tablet Take 1 tablet by mouth 2 (two) times daily.     diltiazem (DILACOR XR) 180 MG 24 hr capsule Take 180 mg by mouth daily.     furosemide (LASIX) 40 MG tablet Take 1 tablet (40 mg total) by mouth 2 (two) times daily. 120 tablet 3   hydroxyurea (HYDREA) 500 MG capsule TAKE 1 CAPSULE BY MOUTH ONCE A DAY. MAY TAKE WITH FOOD TO MINIMIZE GI SIDE EFFECTS 30 capsule 1   loratadine (CLARITIN) 10 MG tablet Take 10 mg by mouth daily.     losartan (  COZAAR) 100 MG tablet Take 1 tablet (100 mg total) by mouth daily. 90 tablet 3   methimazole (TAPAZOLE) 5 MG tablet Take 5 mg by mouth daily.     potassium chloride SA (KLOR-CON) 20 MEQ tablet Take 1 tablet (20 mEq total) by mouth 2 (two) times daily. 120 tablet 2   psyllium (METAMUCIL) 58.6 % packet Take 1 packet by mouth daily.     timolol (TIMOPTIC) 0.5 % ophthalmic solution Place 1 drop into both eyes daily.     No current facility-administered medications for this visit.    REVIEW OF SYSTEMS:   .10 Point review of Systems was done is negative except as noted above.  PHYSICAL EXAMINATION: ECOG PERFORMANCE STATUS: 2  . Vitals:   03/21/21 1203  BP: (!) 172/59  Pulse: 63  Resp: 16  Temp: 97.7 F (36.5 C)  SpO2: 100%    Filed Weights    03/21/21 1203  Weight: 99 lb 9.6 oz (45.2 kg)    .Body mass index is 19.45 kg/m. Marland Kitchen GENERAL:alert, in no acute distress and comfortable SKIN: no acute rashes, no significant lesions EYES: conjunctiva are pink and non-injected, sclera anicteric OROPHARYNX: MMM, no exudates, no oropharyngeal erythema or ulceration NECK: supple, no JVD LYMPH:  no palpable lymphadenopathy in the cervical, axillary or inguinal regions LUNGS: clear to auscultation b/l with normal respiratory effort HEART: regular rate & rhythm ABDOMEN:  normoactive bowel sounds , non tender, not distended. Extremity: no pedal edema PSYCH: alert & oriented x 3 with fluent speech NEURO: no focal motor/sensory deficits  LABORATORY DATA:  I have reviewed the data as listed  CBC Latest Ref Rng & Units 03/21/2021 01/10/2021 11/08/2020  WBC 4.0 - 10.5 K/uL 6.4 5.9 6.6  Hemoglobin 12.0 - 15.0 g/dL 13.1 12.5 12.7  Hematocrit 36.0 - 46.0 % 40.4 37.5 37.3  Platelets 150 - 400 K/uL 328 310 350    . CMP Latest Ref Rng & Units 03/21/2021 01/10/2021 11/08/2020  Glucose 70 - 99 mg/dL 80 120(H) 83  BUN 8 - 23 mg/dL 19 25(H) 26(H)  Creatinine 0.44 - 1.00 mg/dL 1.11(H) 1.25(H) 1.15(H)  Sodium 135 - 145 mmol/L 143 144 143  Potassium 3.5 - 5.1 mmol/L 4.0 3.9 4.3  Chloride 98 - 111 mmol/L 104 101 102  CO2 22 - 32 mmol/L 30 31 33(H)  Calcium 8.9 - 10.3 mg/dL 9.3 9.7 9.9  Total Protein 6.5 - 8.1 g/dL 7.2 7.1 6.8  Total Bilirubin 0.3 - 1.2 mg/dL 0.7 0.7 0.8  Alkaline Phos 38 - 126 U/L 93 82 64  AST 15 - 41 U/L 28 28 26   ALT 0 - 44 U/L 22 20 21    . Lab Results  Component Value Date   LDH 181 03/21/2021      RADIOGRAPHIC STUDIES: I have personally reviewed the radiological images as listed and agreed with the findings in the report. No results found.  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  Less than 45% which is the target. -No notable toxicities from hydroxyurea at this time. -Continue current dose of hydroxyurea at 500 mg p.o. daily with food. -Patient was recommended to drink at least 64 ounces  of water daily. -Continue over-the-counter vitamin B complex 1 capsule p.o. daily. -Continue aspirin -Avoid iron supplements -Continue to stay up to speed with age-appropriate vaccinations with PCP. FOLLOW UP: Return to clinic with Dr. Irene Limbo with labs in 3 months  Orders Placed This Encounter  Procedures   CBC with Differential/Platelet    Standing Status:   Future    Standing Expiration Date:   03/29/2022   CMP ([place] only)    Standing Status:   Future    Standing Expiration Date:   03/29/2022   Lactate dehydrogenase    Standing Status:   Future    Standing Expiration Date:   03/29/2022     All of the patients questions were answered with apparent satisfaction. The patient knows to call the clinic with any problems, questions or concerns.  Sullivan Lone MD [place] AAHIVMS [facility] Hematology/Oncology Physician [facility]

## 2021-03-29 NOTE — Addendum Note (Signed)
Addended by: Sullivan Lone on: 03/29/2021 04:32 PM   Modules accepted: Orders

## 2021-04-26 ENCOUNTER — Other Ambulatory Visit (HOSPITAL_COMMUNITY): Payer: Self-pay

## 2021-05-08 ENCOUNTER — Other Ambulatory Visit: Payer: Self-pay

## 2021-05-08 ENCOUNTER — Encounter: Payer: Self-pay | Admitting: Cardiology

## 2021-05-08 ENCOUNTER — Ambulatory Visit: Payer: Medicare PPO | Admitting: Cardiology

## 2021-05-08 VITALS — BP 188/73 | HR 67 | Temp 98.0°F | Resp 16 | Ht 60.0 in | Wt 100.0 lb

## 2021-05-08 DIAGNOSIS — I739 Peripheral vascular disease, unspecified: Secondary | ICD-10-CM

## 2021-05-08 DIAGNOSIS — I2729 Other secondary pulmonary hypertension: Secondary | ICD-10-CM | POA: Diagnosis not present

## 2021-05-08 DIAGNOSIS — I2121 ST elevation (STEMI) myocardial infarction involving left circumflex coronary artery: Secondary | ICD-10-CM | POA: Diagnosis not present

## 2021-05-08 DIAGNOSIS — I10 Essential (primary) hypertension: Secondary | ICD-10-CM | POA: Diagnosis not present

## 2021-05-08 DIAGNOSIS — D45 Polycythemia vera: Secondary | ICD-10-CM | POA: Diagnosis not present

## 2021-05-08 DIAGNOSIS — I2721 Secondary pulmonary arterial hypertension: Secondary | ICD-10-CM

## 2021-05-08 DIAGNOSIS — I272 Pulmonary hypertension, unspecified: Secondary | ICD-10-CM

## 2021-05-08 NOTE — Progress Notes (Signed)
Patient referred by Haywood Pao, MD for leg edema  Subjective:   [name], female    DOB: [birth year], 88 y.o.   MRN: [id number]   Chief Complaint  Patient presents with   Hypertension   Follow-up    3 month    HPI  88 y.o. Caucasian female with hypertension, CKD 3a, COPD, pulmonary hypertension, multinodular goiter w/h/o thyrotoxicosis, polycythemia vera  Patient is doing well.  Other than stable leg edema, she has no other symptoms.  Denies shortness of breath, chest pain, presyncope, syncope.  In 01/2021, she did well on 6 MWT with total distance of 360 m.  She can perform 6-minute walk test in office today, just below.   Initial consultation HPI 06/2020: Patient reports episodes of palpitations, mostly at night. Episodes have increase din frequency. Blood pressure is elevated today, was noted to be 152/80 mmHg at recent PCP visit. She denies chest pain, shortness of breath, palpitations, orthopnea, PND, TIA/syncope. She reports leg edema. She tells me she is known to have PAD, her feet stay cold, but denies any claudication symptoms.  She is seeing hematologist Dr. Irene Limbo for thrombocytosis, has upcoming blood work pending to differentiate between clonal and reactive causes.      Current Outpatient Medications on File Prior to Visit  Medication Sig Dispense Refill   aspirin 81 MG tablet Take 81 mg by mouth daily.     b complex vitamins capsule Take 1 capsule by mouth daily.     calcium-vitamin D 250-100 MG-UNIT per tablet Take 1 tablet by mouth 2 (two) times daily.     diltiazem (DILACOR XR) 180 MG 24 hr capsule Take 180 mg by mouth daily.     furosemide (LASIX) 40 MG tablet Take 1 tablet (40 mg total) by mouth 2 (two) times daily. 120 tablet 3   hydroxyurea (HYDREA) 500 MG capsule TAKE 1 CAPSULE BY MOUTH ONCE A DAY. MAY TAKE WITH FOOD TO MINIMIZE GI SIDE EFFECTS 30 capsule 1   loratadine (CLARITIN) 10 MG tablet Take 10 mg by mouth daily.     losartan (COZAAR)  100 MG tablet Take 1 tablet (100 mg total) by mouth daily. 90 tablet 3   methimazole (TAPAZOLE) 5 MG tablet Take 5 mg by mouth daily.     potassium chloride SA (KLOR-CON) 20 MEQ tablet Take 1 tablet (20 mEq total) by mouth 2 (two) times daily. 120 tablet 2   psyllium (METAMUCIL) 58.6 % packet Take 1 packet by mouth daily.     timolol (TIMOPTIC) 0.5 % ophthalmic solution Place 1 drop into both eyes daily.     No current facility-administered medications on file prior to visit.    Cardiovascular and other pertinent studies:  EKG 05/08/2021: Sinus rhythm 69 bpm  Left atrial enlargement TWI in anteroseptal leads, consider ischemia  Echocardiogram 01/23/2021:  Normal LV systolic function with EF 56%. Left ventricle cavity is small.  Normal left ventricular wall thickness. Normal global wall motion. Normal  diastolic filling pattern, normal LAP. Calculated EF 56%.  Right atrial cavity is severely dilated.  Right ventricle cavity is moderately dilated. Normal right ventricular  function.  Trileaflet aortic valve. No evidence of aortic stenosis. Trace aortic  regurgitation.  Severe tricuspid regurgitation. Dilation of the tricuspid valve annulus.  Moderate pulmonary hypertension. RVSP measures 59 mmHg.  IVC is dilated with poor inspiration collapse consistent with elevated  right atrial pressure (estimated 15 mm Hg).  Compared to the study done on 07/11/2020, previously  right atrium was only  moderately dilated and tricuspid regurgitation was moderate.  Right  ventricular dilatation is new, no significant change in RV systolic  pressure.  ABI 07/11/2020:  This exam reveals normal perfusion of the right lower extremity (ABI 1.00)  with triphasic waveform.    This exam reveals mildly decreased perfusion of the left lower extremity,  noted at the post tibial artery level (ABI 0.84) with mildly abnormal  biphasic waveform. Left AT appears to be diffusely diseased.  Mobile cardiac telemetry 7  days 06/26/2020 - 07/03/2020: Dominant rhythm: Sinus. HR 58-96 bpm. Avg HR 68 bpm, in sinus rhythm. 12 episodes of SVT/atrial tachycardia, fastest at 162 bpm for 8 beats, longest for 7 beatsat 109 bpm. 7.2% isolated SVE, <1 couplet/triplets. <1% isolated VE, couplets No atrial fibrillation/atrial flutter//VT/high grade AV block, sinus pause >3sec noted. 10 patient triggered events, most correlated with SVE.    EKG 06/26/2020: Sinus rhythm 69 bpm Left atrial enlargement.  Old anteroseptal infarct Probable LVH   Recent labs: 09/06/2020: Glucose 126, BUN/Cr 26/1.13. EGFR 48. Na/K 144/3.8. Rest of the CMP normal H/H 13/42. MCV 105. Platelets 372 HbA1C N/A Chol 149, TG 60, HDL 74, LDL 63 TSH N/A  06/12/2020: Glucose 68, BUN/Cr 21/1.0. EGFR 52. Na/K 142/4.3. Rest of the CMP normal H/H 16/49. MCV 96. Platelets 701 HbA1C N/A Lipid panel N/A TSH 4.6 high. Free T4 0.9 normal NT pro BNP 571 normal    Review of Systems  Constitutional: Positive for malaise/fatigue.  Cardiovascular:  Positive for leg swelling. Negative for chest pain, dyspnea on exertion, palpitations and syncope.        Vitals:   05/08/21 1304  BP: (!) 164/60  Pulse: 60  Resp: 16  Temp: 98 F (36.7 C)  SpO2: 97%     Body mass index is 19.53 kg/m. Filed Weights   05/08/21 1304  Weight: 100 lb (45.4 kg)    Six Minute Walk - 05/08/21 1323       Six Minute Walk   Type Continuous    Lap distance in meters  20 meters    Laps Completed  20    Baseline SPO2 100 %      Interval Oxygen Saturation and HR    2 Minute Oxygen Saturation % 98 %    2 Minute HR 91    4 Minute Oxygen Saturation % 98 %    4 Minute HR 94    6 Minute Oxygen Saturation % 100 %    6 Minute HR 94      End of Test Values    BP (sitting) 188/73    Heartrate 68    SPO2 100 %      2 Minutes Post Walk Values   Stopped or paused before six minutes? No              Objective:   Physical Exam Vitals and nursing note  reviewed.  Constitutional:      General: She is not in acute distress.    Appearance: She is well-developed.  HENT:     Head: Normocephalic and atraumatic.  Eyes:     Conjunctiva/sclera: Conjunctivae normal.     Pupils: Pupils are equal, round, and reactive to light.  Neck:     Vascular: No JVD.  Cardiovascular:     Rate and Rhythm: Normal rate and regular rhythm.     Pulses: Intact distal pulses.          Dorsalis pedis pulses are 1+  on the right side and 1+ on the left side.       Posterior tibial pulses are 0 on the right side and 0 on the left side.     Heart sounds: Murmur heard.  Holosystolic murmur is present with a grade of 3/6 at the lower right sternal border.  Pulmonary:     Effort: Pulmonary effort is normal.     Breath sounds: Normal breath sounds. No wheezing or rales.  Abdominal:     General: Bowel sounds are normal.     Palpations: Abdomen is soft.     Tenderness: There is no rebound.  Musculoskeletal:        General: No tenderness. Normal range of motion.     Right lower leg: Edema (2+) present.     Left lower leg: Edema (2+) present.  Lymphadenopathy:     Cervical: No cervical adenopathy.  Skin:    General: Skin is warm and dry.  Neurological:     Mental Status: She is alert and oriented to person, place, and time.     Cranial Nerves: No cranial nerve deficit.        Assessment & Recommendations:   88 y.o. Caucasian female with hypertension, CKD 3a, ?COPD, pulmonary hypertension, multinodular goiter w/h/o thyrotoxicosis, polycythemia vera  Palpitations:  Likely due to PACs and occasional atrial tachycardia, controlled with diltiazem.  Pulmonary hypertension: Longstanding pulmonary hypertension, could either be WHO group 1 or 2.  Her history of polycythemia vera makes associated pulmonary hypertension likely.  Other than negative, she is asymptomatic and has excellent 6-minute walk test distance without any hypoxia.  She has developed RA and RV  dilatation, although RV systolic function remains intact.  She is reluctant to start any medical therapy for the same at this time.  I will continue clinical follow-up with 6-minute walk test every 3 months, and echocardiogram every 6 months.  Continue Lasix 40 mg daily.  No indication for aspirin from cardiovascular standpoint, unless it is recommended by her rheumatologist Dr. Irene Limbo given her PCV.   Hypertension: Suspect component of whitecoat hypertension.  She does indeed have normal blood pressures at home.  I made no change to antihypertensive therapy today.   PAD: Mild PAD LLE. Continue risk factor modification In absence of bleeding, okay to continue aspirin.   Not on statin, lipids well controlled.  Follow-up in 3 months with 6-minute walk test and echocardiogram.    Nigel Mormon, MD Pager: [phone] Office: [phone]

## 2021-05-20 DIAGNOSIS — X32XXXD Exposure to sunlight, subsequent encounter: Secondary | ICD-10-CM | POA: Diagnosis not present

## 2021-05-20 DIAGNOSIS — L0212 Furuncle of neck: Secondary | ICD-10-CM | POA: Diagnosis not present

## 2021-05-20 DIAGNOSIS — L570 Actinic keratosis: Secondary | ICD-10-CM | POA: Diagnosis not present

## 2021-05-20 DIAGNOSIS — L820 Inflamed seborrheic keratosis: Secondary | ICD-10-CM | POA: Diagnosis not present

## 2021-05-20 DIAGNOSIS — B9689 Other specified bacterial agents as the cause of diseases classified elsewhere: Secondary | ICD-10-CM | POA: Diagnosis not present

## 2021-05-22 DIAGNOSIS — H401131 Primary open-angle glaucoma, bilateral, mild stage: Secondary | ICD-10-CM | POA: Diagnosis not present

## 2021-05-28 ENCOUNTER — Other Ambulatory Visit: Payer: Self-pay | Admitting: Hematology

## 2021-05-28 ENCOUNTER — Other Ambulatory Visit (HOSPITAL_COMMUNITY): Payer: Self-pay

## 2021-05-28 MED ORDER — HYDROXYUREA 500 MG PO CAPS
ORAL_CAPSULE | ORAL | 1 refills | Status: DC
  Filled 2021-05-28: qty 30, 30d supply, fill #0
  Filled 2021-07-02: qty 30, 30d supply, fill #1

## 2021-06-20 ENCOUNTER — Inpatient Hospital Stay: Payer: Medicare PPO | Attending: Hematology

## 2021-06-20 ENCOUNTER — Other Ambulatory Visit: Payer: Self-pay

## 2021-06-20 ENCOUNTER — Inpatient Hospital Stay: Payer: Medicare PPO | Admitting: Hematology

## 2021-06-20 VITALS — BP 156/55 | HR 65 | Temp 97.5°F | Resp 18 | Wt 100.1 lb

## 2021-06-20 DIAGNOSIS — Z1589 Genetic susceptibility to other disease: Secondary | ICD-10-CM

## 2021-06-20 DIAGNOSIS — E079 Disorder of thyroid, unspecified: Secondary | ICD-10-CM | POA: Diagnosis not present

## 2021-06-20 DIAGNOSIS — D471 Chronic myeloproliferative disease: Secondary | ICD-10-CM

## 2021-06-20 DIAGNOSIS — M8580 Other specified disorders of bone density and structure, unspecified site: Secondary | ICD-10-CM | POA: Insufficient documentation

## 2021-06-20 DIAGNOSIS — D45 Polycythemia vera: Secondary | ICD-10-CM | POA: Diagnosis not present

## 2021-06-20 LAB — CBC WITH DIFFERENTIAL/PLATELET
Abs Immature Granulocytes: 0.03 10*3/uL (ref 0.00–0.07)
Basophils Absolute: 0.1 10*3/uL (ref 0.0–0.1)
Basophils Relative: 1 %
Eosinophils Absolute: 0.2 10*3/uL (ref 0.0–0.5)
Eosinophils Relative: 3 %
HCT: 42.5 % (ref 36.0–46.0)
Hemoglobin: 13.9 g/dL (ref 12.0–15.0)
Immature Granulocytes: 1 %
Lymphocytes Relative: 22 %
Lymphs Abs: 1.4 10*3/uL (ref 0.7–4.0)
MCH: 38.1 pg — ABNORMAL HIGH (ref 26.0–34.0)
MCHC: 32.7 g/dL (ref 30.0–36.0)
MCV: 116.4 fL — ABNORMAL HIGH (ref 80.0–100.0)
Monocytes Absolute: 0.6 10*3/uL (ref 0.1–1.0)
Monocytes Relative: 9 %
Neutro Abs: 4 10*3/uL (ref 1.7–7.7)
Neutrophils Relative %: 64 %
Platelets: 408 10*3/uL — ABNORMAL HIGH (ref 150–400)
RBC: 3.65 MIL/uL — ABNORMAL LOW (ref 3.87–5.11)
RDW: 13.6 % (ref 11.5–15.5)
WBC: 6.3 10*3/uL (ref 4.0–10.5)
nRBC: 0 % (ref 0.0–0.2)

## 2021-06-20 LAB — CMP (CANCER CENTER ONLY)
ALT: 23 U/L (ref 0–44)
AST: 27 U/L (ref 15–41)
Albumin: 4.4 g/dL (ref 3.5–5.0)
Alkaline Phosphatase: 89 U/L (ref 38–126)
Anion gap: 7 (ref 5–15)
BUN: 23 mg/dL (ref 8–23)
CO2: 32 mmol/L (ref 22–32)
Calcium: 9.8 mg/dL (ref 8.9–10.3)
Chloride: 103 mmol/L (ref 98–111)
Creatinine: 1.15 mg/dL — ABNORMAL HIGH (ref 0.44–1.00)
GFR, Estimated: 46 mL/min — ABNORMAL LOW (ref >60)
Glucose, Bld: 103 mg/dL — ABNORMAL HIGH (ref 70–99)
Potassium: 3.8 mmol/L (ref 3.5–5.1)
Sodium: 142 mmol/L (ref 135–145)
Total Bilirubin: 0.6 mg/dL (ref 0.3–1.2)
Total Protein: 7.5 g/dL (ref 6.5–8.1)

## 2021-06-20 LAB — LACTATE DEHYDROGENASE: LDH: 174 U/L (ref 98–192)

## 2021-06-20 NOTE — Progress Notes (Signed)
? ? ?HEMATOLOGY/ONCOLOGY CLINIC NOTE ? ?Date of Service: .06/20/2021 ? ? ?Patient Care Team: ?[name], Fransico Him, MD as PCP - General (Internal Medicine) ? 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 ?HISTORY OF PRESENTING ILLNESS:  ? ?Please see previous notes on initial presentation of disease ? ?INTERVAL HISTORY ? ?[name] is here for continued evaluation and management of her polycythemia vera with JAK2 mutation. ?She notes no acute new concerns since her last clinic visit. ?Good p.o. intake.  No significant weight change. ?Reports no toxicities with her current dose of hydroxyurea. ?Labs done today were reviewed in detail. ? ?MEDICAL HISTORY:  ?Past Medical History:  ?Diagnosis Date  ? Osteopenia   ? Thyroid disease   ? Varicose veins VENOUS INSUFFICIENCY SLOW HEALING OF WOUNDS Charissa Bash TO LOWER EXTEMETIES  ? Venous stasis dermatitis   ? ? ?SURGICAL HISTORY: ?Past Surgical History:  ?Procedure Laterality Date  ? BREAST CYST ASPIRATION Left   ? EYE SURGERY    ? TONSILLECTOMY    ? ? ?SOCIAL HISTORY: ?Social History  ? ?Socioeconomic History  ? Marital status: Married  ?  Spouse name: Not on file  ? Number of children: 2  ? Years of education: Not on file  ? Highest education level: Not on file  ?Occupational History  ? Not on file  ?Tobacco Use  ? Smoking status: Never  ? Smokeless tobacco: Never  ?Vaping Use  ? Vaping Use: Never used  ?Substance and Sexual Activity  ? Alcohol use: No  ? Drug use: Never  ? Sexual activity: Not on file  ?Other Topics Concern  ? Not on file  ?Social History Narrative  ? Not on file  ? ?Social Determinants of Health  ? ?Financial Resource Strain: Not on file  ?Food Insecurity: Not on file  ?Transportation Needs: Not on file  ?Physical Activity: Not on file  ?Stress: Not on file  ?Social Connections: Not on file  ?Intimate Partner Violence: Not on file  ? ? ?FAMILY HISTORY: ?Family History  ?Problem  Relation Age of Onset  ? Heart disease Father   ? Cancer Son   ? Cancer Maternal Aunt   ? Cancer Paternal Uncle   ? Cancer Maternal Grandfather   ? Breast cancer Neg Hx   ? ? ?ALLERGIES:  is allergic to doxycycline calcium and penicillin v. ? ?MEDICATIONS:  ?Current Outpatient Medications  ?Medication Sig Dispense Refill  ? aspirin 81 MG tablet Take 81 mg by mouth daily.    ? b complex vitamins capsule Take 1 capsule by mouth daily.    ? calcium-vitamin D 250-100 MG-UNIT per tablet Take 1 tablet by mouth 2 (two) times daily.    ? diltiazem (DILACOR XR) 180 MG 24 hr capsule Take 180 mg by mouth daily.    ? furosemide (LASIX) 40 MG tablet Take 1 tablet (40 mg total) by mouth 2 (two) times daily. 120 tablet 3  ? hydroxyurea (HYDREA) 500 MG capsule TAKE 1 CAPSULE BY MOUTH ONCE A DAY. MAY TAKE WITH FOOD TO MINIMIZE GI SIDE EFFECTS 30 capsule 1  ? loratadine (CLARITIN) 10 MG tablet Take 10 mg by mouth daily.    ? losartan (COZAAR) 100 MG tablet Take 1 tablet (100 mg total) by mouth daily. 90 tablet 3  ? methimazole (TAPAZOLE) 5 MG tablet Take 5 mg by mouth daily.    ? potassium chloride SA (KLOR-CON) 20 MEQ tablet Take 1 tablet (20 mEq total) by  mouth 2 (two) times daily. 120 tablet 2  ? psyllium (METAMUCIL) 58.6 % packet Take 1 packet by mouth daily.    ? timolol (TIMOPTIC) 0.5 % ophthalmic solution Place 1 drop into both eyes daily.    ? losartan (COZAAR) 100 MG tablet 1 tablet    ? ?No current facility-administered medications for this visit.  ? ? ?REVIEW OF SYSTEMS:   ?10 Point review of Systems was done is negative except as noted above. ? ?PHYSICAL EXAMINATION: ?ECOG PERFORMANCE STATUS: 2 ? ?. ?Vitals:  ? 06/20/21 1215  ?BP: (!) 156/55  ?Pulse: 65  ?Resp: 18  ?Temp: (!) 97.5 ?F (36.4 ?C)  ?SpO2: 99%  ? ? ?Filed Weights  ? 06/20/21 1215  ?Weight: 100 lb 1.6 oz (45.4 kg)  ? ? ?.Body mass index is 19.55 kg/m?Marland Kitchen ?NAD ?GENERAL:alert, in no acute distress and comfortable ?SKIN: no acute rashes, no significant  lesions ?EYES: conjunctiva are pink and non-injected, sclera anicteric ?OROPHARYNX: MMM, no exudates, no oropharyngeal erythema or ulceration ?NECK: supple, no JVD ?LYMPH:  no palpable lymphadenopathy in the cervical, axillary or inguinal regions ?LUNGS: clear to auscultation b/l with normal respiratory effort ?HEART: regular rate & rhythm ?ABDOMEN:  normoactive bowel sounds , non tender, not distended. ?Extremity: no pedal edema ?PSYCH: alert & oriented x 3 with fluent speech ?NEURO: no focal motor/sensory deficits ? ?LABORATORY DATA:  ?I have reviewed the data as listed ? ?CBC Latest Ref Rng & Units 06/20/2021 03/21/2021 01/10/2021  ?WBC 4.0 - 10.5 K/uL 6.3 6.4 5.9  ?Hemoglobin 12.0 - 15.0 g/dL 13.9 13.1 12.5  ?Hematocrit 36.0 - 46.0 % 42.5 40.4 37.5  ?Platelets 150 - 400 K/uL 408(H) 328 310  ? ? ?. ?CMP Latest Ref Rng & Units 06/20/2021 03/21/2021 01/10/2021  ?Glucose 70 - 99 mg/dL 103(H) 80 120(H)  ?BUN 8 - 23 mg/dL 23 19 25(H)  ?Creatinine 0.44 - 1.00 mg/dL 1.15(H) 1.11(H) 1.25(H)  ?Sodium 135 - 145 mmol/L 142 143 144  ?Potassium 3.5 - 5.1 mmol/L 3.8 4.0 3.9  ?Chloride 98 - 111 mmol/L 103 104 101  ?CO2 22 - 32 mmol/L 32 30 31  ?Calcium 8.9 - 10.3 mg/dL 9.8 9.3 9.7  ?Total Protein 6.5 - 8.1 g/dL 7.5 7.2 7.1  ?Total Bilirubin 0.3 - 1.2 mg/dL 0.6 0.7 0.7  ?Alkaline Phos 38 - 126 U/L 89 93 82  ?AST 15 - 41 U/L 27 28 28   ?ALT 0 - 44 U/L 23 22 20   ? ?. ?Lab Results  ?Component Value Date  ? LDH 174 06/20/2021  ? ? ? ? ?RADIOGRAPHIC STUDIES: ?I have personally reviewed the radiological images as listed and agreed with the findings in the report. ?No results found. ? ?06/24/2020 JAK2  ? ? ? ? ?ASSESSMENT & PLAN:  ? ?88 yo with  ? 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 ?CBC shows normal hemoglobin of 13.9 with a hematocrit of 42.5, WBC count of 6.3k, platelets at 408k ?CMP unremarkable ?LDH within normal limits at  174 ? ?Patient's platelets are fairly close to her target goal of 200-400k  ?Her hematocrit is at goal of less than 45%.   ?No prohibitive toxicities from current dose of hydroxyurea. ?Continue current dose of hydroxyurea at 500 mg p.o. daily with food. ?Continue vitamin B complex 1 capsule p.o. daily ?Continue aspirin 81 mg p.o. daily ?Again reiterated avoiding iron supplementation ?-Follow-up with PCP to stay up-to-date with age-appropriate  vaccinations. ? ?FOLLOW UP: ?Return to clinic with Dr. Irene Limbo with labs in 3 months ? ? ?Orders Placed This Encounter  ?Procedures  ? CBC with Differential/Platelet  ?  Standing Status:   Future  ?  Standing Expiration Date:   06/21/2022  ? CMP ([facility] only)  ?  Standing Status:   Future  ?  Standing Expiration Date:   06/21/2022  ? Lactate dehydrogenase  ?  Standing Status:   Future  ?  Standing Expiration Date:   06/21/2022  ? ?The total time spent in the appointment was 20 minutes*. ? ?All of the patient's questions were answered with apparent satisfaction. The patient knows to call the clinic with any problems, questions or concerns. ? ? ?Sullivan Lone MD [place] AAHIVMS [facility] ?Hematology/Oncology Physician ?[place] ? ?.*Total Encounter Time as defined by the Centers for Medicare and Medicaid Services includes, in addition to the face-to-face time of a patient visit (documented in the note above) non-face-to-face time: obtaining and reviewing outside history, ordering and reviewing medications, tests or procedures, care coordination (communications with other health care professionals or caregivers) and documentation in the medical record. ? ? ?

## 2021-06-23 ENCOUNTER — Telehealth: Payer: Self-pay | Admitting: Hematology

## 2021-06-23 NOTE — Telephone Encounter (Signed)
Left message with follow-up appointment per 3/3 los. ?

## 2021-06-30 DIAGNOSIS — N1831 Chronic kidney disease, stage 3a: Secondary | ICD-10-CM | POA: Diagnosis not present

## 2021-06-30 DIAGNOSIS — D692 Other nonthrombocytopenic purpura: Secondary | ICD-10-CM | POA: Diagnosis not present

## 2021-06-30 DIAGNOSIS — I129 Hypertensive chronic kidney disease with stage 1 through stage 4 chronic kidney disease, or unspecified chronic kidney disease: Secondary | ICD-10-CM | POA: Diagnosis not present

## 2021-06-30 DIAGNOSIS — J449 Chronic obstructive pulmonary disease, unspecified: Secondary | ICD-10-CM | POA: Diagnosis not present

## 2021-06-30 DIAGNOSIS — M810 Age-related osteoporosis without current pathological fracture: Secondary | ICD-10-CM | POA: Diagnosis not present

## 2021-06-30 DIAGNOSIS — D45 Polycythemia vera: Secondary | ICD-10-CM | POA: Diagnosis not present

## 2021-06-30 DIAGNOSIS — E0520 Thyrotoxicosis with toxic multinodular goiter without thyrotoxic crisis or storm: Secondary | ICD-10-CM | POA: Diagnosis not present

## 2021-06-30 DIAGNOSIS — I272 Pulmonary hypertension, unspecified: Secondary | ICD-10-CM | POA: Diagnosis not present

## 2021-06-30 DIAGNOSIS — I739 Peripheral vascular disease, unspecified: Secondary | ICD-10-CM | POA: Diagnosis not present

## 2021-07-02 ENCOUNTER — Other Ambulatory Visit (HOSPITAL_COMMUNITY): Payer: Self-pay

## 2021-07-18 ENCOUNTER — Other Ambulatory Visit: Payer: Self-pay | Admitting: Cardiology

## 2021-07-18 DIAGNOSIS — I10 Essential (primary) hypertension: Secondary | ICD-10-CM

## 2021-08-01 ENCOUNTER — Other Ambulatory Visit (HOSPITAL_COMMUNITY): Payer: Self-pay

## 2021-08-01 ENCOUNTER — Other Ambulatory Visit: Payer: Self-pay | Admitting: Hematology

## 2021-08-01 MED ORDER — HYDROXYUREA 500 MG PO CAPS
ORAL_CAPSULE | ORAL | 1 refills | Status: DC
  Filled 2021-08-01: qty 30, 30d supply, fill #0
  Filled 2021-09-02: qty 30, 30d supply, fill #1

## 2021-08-04 ENCOUNTER — Ambulatory Visit: Payer: Medicare PPO

## 2021-08-04 ENCOUNTER — Other Ambulatory Visit (HOSPITAL_COMMUNITY): Payer: Self-pay

## 2021-08-04 DIAGNOSIS — I272 Pulmonary hypertension, unspecified: Secondary | ICD-10-CM

## 2021-08-05 ENCOUNTER — Other Ambulatory Visit (HOSPITAL_COMMUNITY): Payer: Self-pay

## 2021-08-08 ENCOUNTER — Ambulatory Visit: Payer: Medicare PPO | Admitting: Cardiology

## 2021-08-11 ENCOUNTER — Encounter: Payer: Self-pay | Admitting: Cardiology

## 2021-08-11 ENCOUNTER — Ambulatory Visit: Payer: Medicare PPO | Admitting: Cardiology

## 2021-08-11 VITALS — BP 193/77 | HR 63 | Temp 97.8°F | Resp 16 | Ht 60.0 in | Wt 110.0 lb

## 2021-08-11 DIAGNOSIS — I739 Peripheral vascular disease, unspecified: Secondary | ICD-10-CM | POA: Diagnosis not present

## 2021-08-11 DIAGNOSIS — I10 Essential (primary) hypertension: Secondary | ICD-10-CM | POA: Diagnosis not present

## 2021-08-11 DIAGNOSIS — I2721 Secondary pulmonary arterial hypertension: Secondary | ICD-10-CM | POA: Insufficient documentation

## 2021-08-11 NOTE — Progress Notes (Signed)
? ? ?Patient referred by [name], Fransico Him, MD for leg edema ? ?Subjective:  ? ?Aviva Signs, female    DOB: 09/28/1932, 88 y.o.   MRN: [id number] ? ? ?Chief Complaint  ?Patient presents with  ? Hypertension  ? Follow-up  ?  6 month  ? ? ?HPI ? ?88 y.o. Caucasian female with hypertension, CKD 3a, COPD, pulmonary hypertension, multinodular goiter w/h/o thyrotoxicosis, polycythemia vera ? ?Patient is doing well.  Other than stable leg edema, she has no other symptoms.  Denies shortness of breath, chest pain, presyncope, syncope. ? ? ?Initial consultation HPI 06/2020: ?Patient reports episodes of palpitations, mostly at night. Episodes have increase din frequency. Blood pressure is elevated today, was noted to be 152/80 mmHg at recent PCP visit. She denies chest pain, shortness of breath, palpitations, orthopnea, PND, TIA/syncope. She reports leg edema. She tells me she is known to have PAD, her feet stay cold, but denies any claudication symptoms. ? ?She is seeing hematologist Dr. Irene Limbo for thrombocytosis, has upcoming blood work pending to differentiate between clonal and reactive causes.   ? ? ?Current Outpatient Medications:  ?  aspirin 81 MG tablet, Take 81 mg by mouth daily., Disp: , Rfl:  ?  b complex vitamins capsule, Take 1 capsule by mouth daily., Disp: , Rfl:  ?  calcium-vitamin D 250-100 MG-UNIT per tablet, Take 1 tablet by mouth 2 (two) times daily., Disp: , Rfl:  ?  cetirizine (ZYRTEC) 10 MG tablet, Take 10 mg by mouth daily., Disp: , Rfl:  ?  diltiazem (DILACOR XR) 180 MG 24 hr capsule, Take 180 mg by mouth daily., Disp: , Rfl:  ?  furosemide (LASIX) 40 MG tablet, Take 1 tablet (40 mg total) by mouth 2 (two) times daily., Disp: 120 tablet, Rfl: 3 ?  hydroxyurea (HYDREA) 500 MG capsule, TAKE 1 CAPSULE BY MOUTH ONCE A DAY. MAY TAKE WITH FOOD TO MINIMIZE GI SIDE EFFECTS, Disp: 30 capsule, Rfl: 1 ?  losartan (COZAAR) 100 MG tablet, 1 tablet, Disp: , Rfl:  ?  losartan (COZAAR) 100 MG tablet, TAKE 1 TABLET BY  MOUTH DAILY, Disp: 90 tablet, Rfl: 3 ?  methimazole (TAPAZOLE) 5 MG tablet, Take 5 mg by mouth daily., Disp: , Rfl:  ?  potassium chloride SA (KLOR-CON) 20 MEQ tablet, Take 1 tablet (20 mEq total) by mouth 2 (two) times daily., Disp: 120 tablet, Rfl: 2 ?  psyllium (METAMUCIL) 58.6 % packet, Take 1 packet by mouth daily., Disp: , Rfl:  ?  timolol (TIMOPTIC) 0.5 % ophthalmic solution, Place 1 drop into both eyes daily., Disp: , Rfl:  ? ?Cardiovascular and other pertinent studies: ? ?EKG 08/11/2021: ?Sinus rhythm 62 bpm  ?Anterior infarct -age undetermined ? ?Echocardiogram 08/04/2021:  ?Normal LV systolic function with visual EF 60-65%. Left ventricle cavity  ?is normal in size. Mild left ventricular hypertrophy. Normal global wall  ?motion. Normal diastolic filling pattern, normal LAP.  ?Right atrial cavity is severely dilated, visually.  ?Right ventricle cavity is moderately dilated, visually. Normal right  ?ventricular function.  ?Trace aortic regurgitation.  ?Mild (Grade I) mitral regurgitation.  ?Severe tricuspid regurgitation. Moderate pulmonary hypertension. RVSP 51  ?mmHg.  ?Mild pulmonic regurgitation.  ?Insignificant pericardial effusion. There is no hemodynamic significance.  ?IVC is dilated with a respiratory response of >50%, estimated RAP 68mHG.  ?Compared to study 01/23/2021 no significant change.  ? ?EKG 05/08/2021: ?Sinus rhythm 69 bpm  ?Left atrial enlargement ?TWI in anteroseptal leads, consider ischemia ?ABI 07/11/2020:  ?This exam reveals  normal perfusion of the right lower extremity (ABI 1.00)  ?with triphasic waveform.    ?This exam reveals mildly decreased perfusion of the left lower extremity,  ?noted at the post tibial artery level (ABI 0.84) with mildly abnormal  ?biphasic waveform. Left AT appears to be diffusely diseased. ? ?Mobile cardiac telemetry 7 days 06/26/2020 - 07/03/2020: ?Dominant rhythm: Sinus. ?HR 58-96 bpm. Avg HR 68 bpm, in sinus rhythm. ?12 episodes of SVT/atrial tachycardia,  fastest at 162 bpm for 8 beats, longest for 7 beatsat 109 bpm. ?7.2% isolated SVE, <1 couplet/triplets. ?<1% isolated VE, couplets ?No atrial fibrillation/atrial flutter//VT/high grade AV block, sinus pause >3sec noted. ?10 patient triggered events, most correlated with SVE. ?  ? ?EKG 06/26/2020: ?Sinus rhythm 69 bpm ?Left atrial enlargement.  ?Old anteroseptal infarct ?Probable LVH ? ? ?Recent labs: ?06/20/2021: ?Glucose 103, BUN/Cr 23/1.15. EGFR 46. Na/K 142/3.8. Rest of the CMP normal ?H/H 13/42. MCV 116. Platelets 408 ? ?09/06/2020: ?Glucose 126, BUN/Cr 26/1.13. EGFR 48. Na/K 144/3.8. Rest of the CMP normal ?H/H 13/42. MCV 105. Platelets 372 ?HbA1C N/A ?Chol 149, TG 60, HDL 74, LDL 63 ?TSH N/A ? ?06/12/2020: ?Glucose 68, BUN/Cr 21/1.0. EGFR 52. Na/K 142/4.3. Rest of the CMP normal ?H/H 16/49. MCV 96. Platelets 701 ?HbA1C N/A ?Lipid panel N/A ?TSH 4.6 high. Free T4 0.9 normal ?NT pro BNP 571 normal ? ? ? ?Review of Systems  ?Constitutional: Positive for malaise/fatigue.  ?Cardiovascular:  Positive for leg swelling. Negative for chest pain, dyspnea on exertion, palpitations and syncope.  ? ?   ? ? ?Vitals:  ? 08/11/21 1201 08/11/21 1208  ?BP: (!) 200/74 (!) 193/77  ?Pulse: 67 63  ?Resp: 16   ?Temp: 97.8 ?F (36.6 ?C)   ?SpO2: 98%   ? ? ? ?Body mass index is 21.48 kg/m?. ?Filed Weights  ? 08/11/21 1201  ?Weight: 110 lb (49.9 kg)  ? ? ? ? Six Minute Walk - 08/11/21 1206   ? ?  ? Six Minute Walk  ? Supplemental oxygen during test? No   ? Lap distance in meters  20 meters   ? Laps Completed  20   ? Baseline Heartrate 58   ? Baseline SPO2 96 %   ?  ? Interval Oxygen Saturation and HR   ? 2 Minute Oxygen Saturation % 90 %   ? 2 Minute HR 105   ? 4 Minute Oxygen Saturation % 89 %   ? 4 Minute HR 78   ? 6 Minute Oxygen Saturation % 92 %   ? 6 Minute HR 92   ?  ? End of Test Values  ?  BP (sitting) 193/77   ? Heartrate 63   ? SPO2 97 %   ?  ? 2 Minutes Post Walk Values  ? Stopped or paused before six minutes? No   ? ?  ?  ? ?   ? ? ? ?Objective:  ? Physical Exam ?Vitals and nursing note reviewed.  ?Constitutional:   ?   General: She is not in acute distress. ?   Appearance: She is well-developed.  ?HENT:  ?   Head: Normocephalic and atraumatic.  ?Eyes:  ?   Conjunctiva/sclera: Conjunctivae normal.  ?   Pupils: Pupils are equal, round, and reactive to light.  ?Neck:  ?   Vascular: No JVD.  ?Cardiovascular:  ?   Rate and Rhythm: Normal rate and regular rhythm.  ?   Pulses: Intact distal pulses.     ?  Dorsalis pedis pulses are 1+ on the right side and 1+ on the left side.  ?     Posterior tibial pulses are 0 on the right side and 0 on the left side.  ?   Heart sounds: Murmur heard.  ?Holosystolic murmur is present with a grade of 3/6 at the lower right sternal border.  ?Pulmonary:  ?   Effort: Pulmonary effort is normal.  ?   Breath sounds: Normal breath sounds. No wheezing or rales.  ?Abdominal:  ?   General: Bowel sounds are normal.  ?   Palpations: Abdomen is soft.  ?   Tenderness: There is no rebound.  ?Musculoskeletal:     ?   General: No tenderness. Normal range of motion.  ?   Right lower leg: Edema (2+) present.  ?   Left lower leg: Edema (2+) present.  ?Lymphadenopathy:  ?   Cervical: No cervical adenopathy.  ?Skin: ?   General: Skin is warm and dry.  ?Neurological:  ?   Mental Status: She is alert and oriented to person, place, and time.  ?   Cranial Nerves: No cranial nerve deficit.  ? ? ?   ? ?Assessment & Recommendations:  ? ?88 y.o. Caucasian female with hypertension, CKD 3a, ?COPD, pulmonary hypertension, multinodular goiter w/h/o thyrotoxicosis, polycythemia vera ? ?Pulmonary hypertension: ?Longstanding pulmonary hypertension, could either be WHO group 1 or 2.  Her history of polycythemia vera makes associated pulmonary hypertension likely.  Other than leg edema, she is asymptomatic and has excellent 6-minute walk test distance without any hypoxia.  She has developed RA and RV dilatation, although RV systolic function  remains intact. She continues to do exceedingly well on 6 min walk test. She is reluctant to start any medical therapy for the same at this time.  I will continue clinical follow-up with 6-minute walk test and

## 2021-08-13 ENCOUNTER — Other Ambulatory Visit: Payer: Self-pay | Admitting: Cardiology

## 2021-08-13 DIAGNOSIS — I739 Peripheral vascular disease, unspecified: Secondary | ICD-10-CM

## 2021-09-01 NOTE — Addendum Note (Signed)
Addended by: Manuela Schwartz T on: 09/01/2021 12:24 PM ? ? Modules accepted: Orders ? ?

## 2021-09-02 ENCOUNTER — Other Ambulatory Visit (HOSPITAL_COMMUNITY): Payer: Self-pay

## 2021-09-24 DIAGNOSIS — I10 Essential (primary) hypertension: Secondary | ICD-10-CM | POA: Diagnosis not present

## 2021-09-30 ENCOUNTER — Inpatient Hospital Stay: Payer: Medicare PPO | Attending: Hematology | Admitting: Hematology

## 2021-09-30 ENCOUNTER — Other Ambulatory Visit: Payer: Self-pay

## 2021-09-30 ENCOUNTER — Inpatient Hospital Stay: Payer: Medicare PPO

## 2021-09-30 VITALS — BP 168/58 | HR 67 | Temp 97.9°F | Resp 18 | Wt 100.3 lb

## 2021-09-30 DIAGNOSIS — Z8249 Family history of ischemic heart disease and other diseases of the circulatory system: Secondary | ICD-10-CM | POA: Diagnosis not present

## 2021-09-30 DIAGNOSIS — D45 Polycythemia vera: Secondary | ICD-10-CM | POA: Insufficient documentation

## 2021-09-30 DIAGNOSIS — Z809 Family history of malignant neoplasm, unspecified: Secondary | ICD-10-CM | POA: Insufficient documentation

## 2021-09-30 DIAGNOSIS — Z1589 Genetic susceptibility to other disease: Secondary | ICD-10-CM

## 2021-09-30 DIAGNOSIS — D7589 Other specified diseases of blood and blood-forming organs: Secondary | ICD-10-CM | POA: Diagnosis not present

## 2021-09-30 DIAGNOSIS — D471 Chronic myeloproliferative disease: Secondary | ICD-10-CM | POA: Diagnosis not present

## 2021-09-30 DIAGNOSIS — Z79899 Other long term (current) drug therapy: Secondary | ICD-10-CM | POA: Insufficient documentation

## 2021-09-30 LAB — CBC WITH DIFFERENTIAL/PLATELET
Abs Immature Granulocytes: 0.01 10*3/uL (ref 0.00–0.07)
Basophils Absolute: 0.1 10*3/uL (ref 0.0–0.1)
Basophils Relative: 1 %
Eosinophils Absolute: 0.2 10*3/uL (ref 0.0–0.5)
Eosinophils Relative: 3 %
HCT: 40.1 % (ref 36.0–46.0)
Hemoglobin: 13.5 g/dL (ref 12.0–15.0)
Immature Granulocytes: 0 %
Lymphocytes Relative: 18 %
Lymphs Abs: 1.3 10*3/uL (ref 0.7–4.0)
MCH: 38.9 pg — ABNORMAL HIGH (ref 26.0–34.0)
MCHC: 33.7 g/dL (ref 30.0–36.0)
MCV: 115.6 fL — ABNORMAL HIGH (ref 80.0–100.0)
Monocytes Absolute: 0.6 10*3/uL (ref 0.1–1.0)
Monocytes Relative: 9 %
Neutro Abs: 4.9 10*3/uL (ref 1.7–7.7)
Neutrophils Relative %: 69 %
Platelets: 366 10*3/uL (ref 150–400)
RBC: 3.47 MIL/uL — ABNORMAL LOW (ref 3.87–5.11)
RDW: 14 % (ref 11.5–15.5)
WBC: 7.2 10*3/uL (ref 4.0–10.5)
nRBC: 0 % (ref 0.0–0.2)

## 2021-09-30 LAB — CMP (CANCER CENTER ONLY)
ALT: 19 U/L (ref 0–44)
AST: 25 U/L (ref 15–41)
Albumin: 4.3 g/dL (ref 3.5–5.0)
Alkaline Phosphatase: 75 U/L (ref 38–126)
Anion gap: 4 — ABNORMAL LOW (ref 5–15)
BUN: 23 mg/dL (ref 8–23)
CO2: 34 mmol/L — ABNORMAL HIGH (ref 22–32)
Calcium: 9.8 mg/dL (ref 8.9–10.3)
Chloride: 104 mmol/L (ref 98–111)
Creatinine: 1.12 mg/dL — ABNORMAL HIGH (ref 0.44–1.00)
GFR, Estimated: 48 mL/min — ABNORMAL LOW (ref >60)
Glucose, Bld: 116 mg/dL — ABNORMAL HIGH (ref 70–99)
Potassium: 3.9 mmol/L (ref 3.5–5.1)
Sodium: 142 mmol/L (ref 135–145)
Total Bilirubin: 0.6 mg/dL (ref 0.3–1.2)
Total Protein: 7.2 g/dL (ref 6.5–8.1)

## 2021-09-30 LAB — LACTATE DEHYDROGENASE: LDH: 170 U/L (ref 98–192)

## 2021-10-01 ENCOUNTER — Telehealth: Payer: Self-pay | Admitting: Hematology

## 2021-10-01 NOTE — Telephone Encounter (Signed)
Scheduled follow-up appointment per 6/13 los. Patient is aware. Mailed calendar.

## 2021-10-06 NOTE — Progress Notes (Signed)
HEMATOLOGY/ONCOLOGY CLINIC NOTE  Date of Service: .09/30/2021   Patient Care Team: [name], Fransico Him, MD as PCP - General (Internal Medicine)  CHIEF COMPLAINTS/PURPOSE OF CONSULTATION:  Follow-up for continued evaluation and management of polycythemia vera  HISTORY OF PRESENTING ILLNESS:   Please see previous notes on initial presentation of disease  INTERVAL HISTORY  [name] is here for continued evaluation and management of polycythemia vera. She notes no acute new symptoms since her last clinic visit. No notable toxicities from her current dose of hydroxyurea. Continues to follow-up with cardiology and her primary care physician. Labs done today were reviewed with her in detail.   MEDICAL HISTORY:  Past Medical History:  Diagnosis Date   Osteopenia    Thyroid disease    Varicose veins VENOUS INSUFFICIENCY SLOW HEALING OF WOUNDS Charissa Bash TO LOWER EXTEMETIES   Venous stasis dermatitis     SURGICAL HISTORY: Past Surgical History:  Procedure Laterality Date   BREAST CYST ASPIRATION Left    EYE SURGERY     TONSILLECTOMY      SOCIAL HISTORY: Social History   Socioeconomic History   Marital status: Married    Spouse name: Not on file   Number of children: 2   Years of education: Not on file   Highest education level: Not on file  Occupational History   Not on file  Tobacco Use   Smoking status: Never   Smokeless tobacco: Never  Vaping Use   Vaping Use: Never used  Substance and Sexual Activity   Alcohol use: No   Drug use: Never   Sexual activity: Not on file  Other Topics Concern   Not on file  Social History Narrative   Not on file   Social Determinants of Health   Financial Resource Strain: Not on file  Food Insecurity: Not on file  Transportation Needs: Not on file  Physical Activity: Not on file  Stress: Not on file  Social Connections: Not on file  Intimate Partner Violence: Not on file    FAMILY HISTORY: Family History   Problem Relation Age of Onset   Heart disease Father    Cancer Son    Cancer Maternal Aunt    Cancer Paternal Uncle    Cancer Maternal Grandfather    Breast cancer Neg Hx     ALLERGIES:  is allergic to doxycycline calcium and penicillin v.  MEDICATIONS:  Current Outpatient Medications  Medication Sig Dispense Refill   aspirin 81 MG tablet Take 81 mg by mouth daily.     b complex vitamins capsule Take 1 capsule by mouth daily.     calcium-vitamin D 250-100 MG-UNIT per tablet Take 1 tablet by mouth 2 (two) times daily.     cetirizine (ZYRTEC) 10 MG tablet Take 10 mg by mouth daily.     diltiazem (DILACOR XR) 180 MG 24 hr capsule Take 180 mg by mouth daily.     furosemide (LASIX) 40 MG tablet TAKE 1 TABLET BY MOUTH TWICE DAILY 120 tablet 3   hydroxyurea (HYDREA) 500 MG capsule TAKE 1 CAPSULE BY MOUTH ONCE A DAY. MAY TAKE WITH FOOD TO MINIMIZE GI SIDE EFFECTS 30 capsule 1   losartan (COZAAR) 100 MG tablet TAKE 1 TABLET BY MOUTH DAILY 90 tablet 3   methimazole (TAPAZOLE) 5 MG tablet Take 5 mg by mouth daily.     potassium chloride SA (KLOR-CON) 20 MEQ tablet Take 1 tablet (20 mEq total) by mouth 2 (two) times daily. 120 tablet  2   psyllium (METAMUCIL) 58.6 % packet Take 1 packet by mouth daily.     timolol (TIMOPTIC) 0.5 % ophthalmic solution Place 1 drop into both eyes daily.     No current facility-administered medications for this visit.    REVIEW OF SYSTEMS:   10 Point review of Systems was done is negative except as noted above.  PHYSICAL EXAMINATION: ECOG PERFORMANCE STATUS: 2  . Vitals:   09/30/21 1510  BP: (!) 168/58  Pulse: 67  Resp: 18  Temp: 97.9 F (36.6 C)  SpO2: 97%    Filed Weights   09/30/21 1510  Weight: 100 lb 4.8 oz (45.5 kg)    .Body mass index is 19.59 kg/m. Marland Kitchen GENERAL:alert, in no acute distress and comfortable SKIN: no acute rashes, no significant lesions EYES: conjunctiva are pink and non-injected, sclera anicteric NECK: supple, no  JVD LYMPH:  no palpable lymphadenopathy in the cervical, axillary or inguinal regions LUNGS: clear to auscultation b/l with normal respiratory effort HEART: regular rate & rhythm ABDOMEN:  normoactive bowel sounds , non tender, not distended. Extremity: no pedal edema PSYCH: alert & oriented x 3 with fluent speech NEURO: no focal motor/sensory deficits   LABORATORY DATA:  I have reviewed the data as listed     Latest Ref Rng & Units 09/30/2021    2:32 PM 06/20/2021   12:01 PM 03/21/2021   11:33 AM  CBC  WBC 4.0 - 10.5 K/uL 7.2  6.3  6.4   Hemoglobin 12.0 - 15.0 g/dL 13.5  13.9  13.1   Hematocrit 36.0 - 46.0 % 40.1  42.5  40.4   Platelets 150 - 400 K/uL 366  408  328     .    Latest Ref Rng & Units 09/30/2021    2:32 PM 06/20/2021   12:01 PM 03/21/2021   11:33 AM  CMP  Glucose 70 - 99 mg/dL 116  103  80   BUN 8 - 23 mg/dL '23  23  19   '$ Creatinine 0.44 - 1.00 mg/dL 1.12  1.15  1.11   Sodium 135 - 145 mmol/L 142  142  143   Potassium 3.5 - 5.1 mmol/L 3.9  3.8  4.0   Chloride 98 - 111 mmol/L 104  103  104   CO2 22 - 32 mmol/L 34  32  30   Calcium 8.9 - 10.3 mg/dL 9.8  9.8  9.3   Total Protein 6.5 - 8.1 g/dL 7.2  7.5  7.2   Total Bilirubin 0.3 - 1.2 mg/dL 0.6  0.6  0.7   Alkaline Phos 38 - 126 U/L 75  89  93   AST 15 - 41 U/L '25  27  28   '$ ALT 0 - 44 U/L '19  23  22    '$ . Lab Results  Component Value Date   LDH 170 09/30/2021      RADIOGRAPHIC STUDIES: I have personally reviewed the radiological images as listed and agreed with the findings in the report. No results found.  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 CBC within normal limits with a hemoglobin of 13.5, hematocrit of 40.1 and platelets of 366k With the platelets and hematocrit are at the target goal. Patient notes no significant toxicity  from her current dose of hydroxyurea.  We shall continue hydroxyurea at 500 mg p.o. daily with food. Continue vitamin B complex 1 capsule p.o. daily Continue aspirin 81 mg p.o. daily Continue follow-up with primary care physician for age-appropriate vaccinations.  FOLLOW UP: Return to clinic with Dr. Irene Limbo with labs in 3 months  .The total time spent in the appointment was 20 minutes* .  All of the patient's questions were answered with apparent satisfaction. The patient knows to call the clinic with any problems, questions or concerns.   Sullivan Lone MD MS AAHIVMS [facility] Hematology/Oncology Physician [facility]  .*Total Encounter Time as defined by the Centers for Medicare and Medicaid Services includes, in addition to the face-to-face time of a patient visit (documented in the note above) non-face-to-face time: obtaining and reviewing outside history, ordering and reviewing medications, tests or procedures, care coordination (communications with other health care professionals or caregivers) and documentation in the medical record.

## 2021-10-08 ENCOUNTER — Other Ambulatory Visit: Payer: Self-pay | Admitting: Hematology

## 2021-10-08 ENCOUNTER — Other Ambulatory Visit (HOSPITAL_COMMUNITY): Payer: Self-pay

## 2021-10-08 MED ORDER — HYDROXYUREA 500 MG PO CAPS
ORAL_CAPSULE | ORAL | 1 refills | Status: DC
  Filled 2021-10-08: qty 30, 30d supply, fill #0
  Filled 2021-11-08: qty 30, 30d supply, fill #1

## 2021-10-24 DIAGNOSIS — I10 Essential (primary) hypertension: Secondary | ICD-10-CM | POA: Diagnosis not present

## 2021-11-08 ENCOUNTER — Other Ambulatory Visit (HOSPITAL_COMMUNITY): Payer: Self-pay

## 2021-11-24 DIAGNOSIS — I10 Essential (primary) hypertension: Secondary | ICD-10-CM | POA: Diagnosis not present

## 2021-12-10 ENCOUNTER — Other Ambulatory Visit: Payer: Self-pay | Admitting: Hematology

## 2021-12-10 ENCOUNTER — Other Ambulatory Visit (HOSPITAL_COMMUNITY): Payer: Self-pay

## 2021-12-10 MED ORDER — HYDROXYUREA 500 MG PO CAPS
ORAL_CAPSULE | ORAL | 1 refills | Status: DC
  Filled 2021-12-10: qty 30, 30d supply, fill #0
  Filled 2022-01-19: qty 30, 30d supply, fill #1

## 2021-12-24 DIAGNOSIS — M810 Age-related osteoporosis without current pathological fracture: Secondary | ICD-10-CM | POA: Diagnosis not present

## 2021-12-24 DIAGNOSIS — E0520 Thyrotoxicosis with toxic multinodular goiter without thyrotoxic crisis or storm: Secondary | ICD-10-CM | POA: Diagnosis not present

## 2021-12-24 DIAGNOSIS — I272 Pulmonary hypertension, unspecified: Secondary | ICD-10-CM | POA: Diagnosis not present

## 2021-12-24 DIAGNOSIS — I10 Essential (primary) hypertension: Secondary | ICD-10-CM | POA: Diagnosis not present

## 2021-12-24 DIAGNOSIS — R7989 Other specified abnormal findings of blood chemistry: Secondary | ICD-10-CM | POA: Diagnosis not present

## 2021-12-29 DIAGNOSIS — I272 Pulmonary hypertension, unspecified: Secondary | ICD-10-CM | POA: Diagnosis not present

## 2021-12-29 DIAGNOSIS — N1831 Chronic kidney disease, stage 3a: Secondary | ICD-10-CM | POA: Diagnosis not present

## 2021-12-29 DIAGNOSIS — I517 Cardiomegaly: Secondary | ICD-10-CM | POA: Diagnosis not present

## 2021-12-29 DIAGNOSIS — Z1331 Encounter for screening for depression: Secondary | ICD-10-CM | POA: Diagnosis not present

## 2021-12-29 DIAGNOSIS — Z1339 Encounter for screening examination for other mental health and behavioral disorders: Secondary | ICD-10-CM | POA: Diagnosis not present

## 2021-12-29 DIAGNOSIS — Z0000 Encounter for general adult medical examination without abnormal findings: Secondary | ICD-10-CM | POA: Diagnosis not present

## 2021-12-29 DIAGNOSIS — J449 Chronic obstructive pulmonary disease, unspecified: Secondary | ICD-10-CM | POA: Diagnosis not present

## 2021-12-29 DIAGNOSIS — D471 Chronic myeloproliferative disease: Secondary | ICD-10-CM | POA: Diagnosis not present

## 2021-12-29 DIAGNOSIS — I739 Peripheral vascular disease, unspecified: Secondary | ICD-10-CM | POA: Diagnosis not present

## 2021-12-29 DIAGNOSIS — R82998 Other abnormal findings in urine: Secondary | ICD-10-CM | POA: Diagnosis not present

## 2021-12-29 DIAGNOSIS — I071 Rheumatic tricuspid insufficiency: Secondary | ICD-10-CM | POA: Diagnosis not present

## 2021-12-29 DIAGNOSIS — I129 Hypertensive chronic kidney disease with stage 1 through stage 4 chronic kidney disease, or unspecified chronic kidney disease: Secondary | ICD-10-CM | POA: Diagnosis not present

## 2021-12-30 ENCOUNTER — Other Ambulatory Visit: Payer: Self-pay

## 2021-12-30 DIAGNOSIS — D471 Chronic myeloproliferative disease: Secondary | ICD-10-CM

## 2021-12-31 ENCOUNTER — Inpatient Hospital Stay: Payer: Medicare PPO

## 2021-12-31 ENCOUNTER — Inpatient Hospital Stay: Payer: Medicare PPO | Attending: Hematology | Admitting: Hematology

## 2021-12-31 ENCOUNTER — Other Ambulatory Visit: Payer: Self-pay

## 2021-12-31 VITALS — BP 185/70 | HR 62 | Temp 98.1°F | Resp 15 | Wt 99.1 lb

## 2021-12-31 DIAGNOSIS — D7589 Other specified diseases of blood and blood-forming organs: Secondary | ICD-10-CM | POA: Insufficient documentation

## 2021-12-31 DIAGNOSIS — D45 Polycythemia vera: Secondary | ICD-10-CM | POA: Insufficient documentation

## 2021-12-31 DIAGNOSIS — Z1589 Genetic susceptibility to other disease: Secondary | ICD-10-CM

## 2021-12-31 DIAGNOSIS — D471 Chronic myeloproliferative disease: Secondary | ICD-10-CM

## 2021-12-31 LAB — CMP (CANCER CENTER ONLY)
ALT: 18 U/L (ref 0–44)
AST: 27 U/L (ref 15–41)
Albumin: 4.3 g/dL (ref 3.5–5.0)
Alkaline Phosphatase: 78 U/L (ref 38–126)
Anion gap: 4 — ABNORMAL LOW (ref 5–15)
BUN: 22 mg/dL (ref 8–23)
CO2: 33 mmol/L — ABNORMAL HIGH (ref 22–32)
Calcium: 9.9 mg/dL (ref 8.9–10.3)
Chloride: 104 mmol/L (ref 98–111)
Creatinine: 1.09 mg/dL — ABNORMAL HIGH (ref 0.44–1.00)
GFR, Estimated: 49 mL/min — ABNORMAL LOW (ref >60)
Glucose, Bld: 103 mg/dL — ABNORMAL HIGH (ref 70–99)
Potassium: 3.9 mmol/L (ref 3.5–5.1)
Sodium: 141 mmol/L (ref 135–145)
Total Bilirubin: 0.6 mg/dL (ref 0.3–1.2)
Total Protein: 7 g/dL (ref 6.5–8.1)

## 2021-12-31 LAB — CBC WITH DIFFERENTIAL (CANCER CENTER ONLY)
Abs Immature Granulocytes: 0.01 10*3/uL (ref 0.00–0.07)
Basophils Absolute: 0.1 10*3/uL (ref 0.0–0.1)
Basophils Relative: 1 %
Eosinophils Absolute: 0.2 10*3/uL (ref 0.0–0.5)
Eosinophils Relative: 3 %
HCT: 41.3 % (ref 36.0–46.0)
Hemoglobin: 13.8 g/dL (ref 12.0–15.0)
Immature Granulocytes: 0 %
Lymphocytes Relative: 18 %
Lymphs Abs: 1.2 10*3/uL (ref 0.7–4.0)
MCH: 39 pg — ABNORMAL HIGH (ref 26.0–34.0)
MCHC: 33.4 g/dL (ref 30.0–36.0)
MCV: 116.7 fL — ABNORMAL HIGH (ref 80.0–100.0)
Monocytes Absolute: 0.6 10*3/uL (ref 0.1–1.0)
Monocytes Relative: 10 %
Neutro Abs: 4.5 10*3/uL (ref 1.7–7.7)
Neutrophils Relative %: 68 %
Platelet Count: 403 10*3/uL — ABNORMAL HIGH (ref 150–400)
RBC: 3.54 MIL/uL — ABNORMAL LOW (ref 3.87–5.11)
RDW: 13.3 % (ref 11.5–15.5)
WBC Count: 6.6 10*3/uL (ref 4.0–10.5)
nRBC: 0 % (ref 0.0–0.2)

## 2021-12-31 LAB — LACTATE DEHYDROGENASE: LDH: 176 U/L (ref 98–192)

## 2022-01-06 NOTE — Progress Notes (Signed)
HEMATOLOGY/ONCOLOGY CLINIC NOTE  Date of Service: 12/31/2021   Patient Care Team: [name], Fransico Him, MD as PCP - General (Internal Medicine)  CHIEF COMPLAINTS/PURPOSE OF CONSULTATION:  Follow-up for continued evaluation and management of polycythemia vera  HISTORY OF PRESENTING ILLNESS:   Please see previous notes on initial presentation of disease  INTERVAL HISTORY  Dorcus L Dawn is here for continued evaluation and management of polycythemia vera. She reports She is doing well with no new symptoms or concerns.  No notable toxicities from her current dose of hydroxyurea.  No other new or acute focal symptoms.  Continues to follow-up with cardiology and her primary care physician.  Labs done today were reviewed with her in detail.  MEDICAL HISTORY:  Past Medical History:  Diagnosis Date   Osteopenia    Thyroid disease    Varicose veins VENOUS INSUFFICIENCY SLOW HEALING OF WOUNDS Charissa Bash TO LOWER EXTEMETIES   Venous stasis dermatitis     SURGICAL HISTORY: Past Surgical History:  Procedure Laterality Date   BREAST CYST ASPIRATION Left    EYE SURGERY     TONSILLECTOMY      SOCIAL HISTORY: Social History   Socioeconomic History   Marital status: Married    Spouse name: Not on file   Number of children: 2   Years of education: Not on file   Highest education level: Not on file  Occupational History   Not on file  Tobacco Use   Smoking status: Never   Smokeless tobacco: Never  Vaping Use   Vaping Use: Never used  Substance and Sexual Activity   Alcohol use: No   Drug use: Never   Sexual activity: Not on file  Other Topics Concern   Not on file  Social History Narrative   Not on file   Social Determinants of Health   Financial Resource Strain: Not on file  Food Insecurity: Not on file  Transportation Needs: Not on file  Physical Activity: Not on file  Stress: Not on file  Social Connections: Not on file  Intimate Partner Violence: Not  on file    FAMILY HISTORY: Family History  Problem Relation Age of Onset   Heart disease Father    Cancer Son    Cancer Maternal Aunt    Cancer Paternal Uncle    Cancer Maternal Grandfather    Breast cancer Neg Hx     ALLERGIES:  is allergic to doxycycline calcium and penicillin v.  MEDICATIONS:  Current Outpatient Medications  Medication Sig Dispense Refill   aspirin 81 MG tablet Take 81 mg by mouth daily.     b complex vitamins capsule Take 1 capsule by mouth daily.     calcium-vitamin D 250-100 MG-UNIT per tablet Take 1 tablet by mouth 2 (two) times daily.     cetirizine (ZYRTEC) 10 MG tablet Take 10 mg by mouth daily.     diltiazem (DILACOR XR) 180 MG 24 hr capsule Take 180 mg by mouth daily.     furosemide (LASIX) 40 MG tablet TAKE 1 TABLET BY MOUTH TWICE DAILY 120 tablet 3   hydroxyurea (HYDREA) 500 MG capsule TAKE 1 CAPSULE BY MOUTH ONCE A DAY. MAY TAKE WITH FOOD TO MINIMIZE GI SIDE EFFECTS 30 capsule 1   losartan (COZAAR) 100 MG tablet TAKE 1 TABLET BY MOUTH DAILY 90 tablet 3   methimazole (TAPAZOLE) 5 MG tablet Take 5 mg by mouth daily.     potassium chloride SA (KLOR-CON) 20 MEQ tablet Take 1 tablet (  20 mEq total) by mouth 2 (two) times daily. 120 tablet 2   psyllium (METAMUCIL) 58.6 % packet Take 1 packet by mouth daily.     timolol (TIMOPTIC) 0.5 % ophthalmic solution Place 1 drop into both eyes daily.     No current facility-administered medications for this visit.    REVIEW OF SYSTEMS:   10 Point review of Systems was done is negative except as noted above.  PHYSICAL EXAMINATION: ECOG PERFORMANCE STATUS: 2  . Vitals:   12/31/21 1148  BP: (!) 185/70  Pulse: 62  Resp: 15  Temp: 98.1 F (36.7 C)  SpO2: 97%    Filed Weights   12/31/21 1148  Weight: 99 lb 1.6 oz (45 kg)    .Body mass index is 19.35 kg/m. NAD GENERAL:alert, in no acute distress and comfortable SKIN: no acute rashes, no significant lesions EYES: conjunctiva are pink and  non-injected, sclera anicteric NECK: supple, no JVD LYMPH:  no palpable lymphadenopathy in the cervical, axillary or inguinal regions LUNGS: clear to auscultation b/l with normal respiratory effort HEART: regular rate & rhythm ABDOMEN:  normoactive bowel sounds , non tender, not distended. Extremity: no pedal edema PSYCH: alert & oriented x 3 with fluent speech NEURO: no focal motor/sensory deficits   LABORATORY DATA:  I have reviewed the data as listed     Latest Ref Rng & Units 12/31/2021   11:34 AM 09/30/2021    2:32 PM 06/20/2021   12:01 PM  CBC  WBC 4.0 - 10.5 K/uL 6.6  7.2  6.3   Hemoglobin 12.0 - 15.0 g/dL 13.8  13.5  13.9   Hematocrit 36.0 - 46.0 % 41.3  40.1  42.5   Platelets 150 - 400 K/uL 403  366  408     .    Latest Ref Rng & Units 12/31/2021   11:34 AM 09/30/2021    2:32 PM 06/20/2021   12:01 PM  CMP  Glucose 70 - 99 mg/dL 103  116  103   BUN 8 - 23 mg/dL '22  23  23   '$ Creatinine 0.44 - 1.00 mg/dL 1.09  1.12  1.15   Sodium 135 - 145 mmol/L 141  142  142   Potassium 3.5 - 5.1 mmol/L 3.9  3.9  3.8   Chloride 98 - 111 mmol/L 104  104  103   CO2 22 - 32 mmol/L 33  34  32   Calcium 8.9 - 10.3 mg/dL 9.9  9.8  9.8   Total Protein 6.5 - 8.1 g/dL 7.0  7.2  7.5   Total Bilirubin 0.3 - 1.2 mg/dL 0.6  0.6  0.6   Alkaline Phos 38 - 126 U/L 78  75  89   AST 15 - 41 U/L '27  25  27   '$ ALT 0 - 44 U/L '18  19  23    '$ . Lab Results  Component Value Date   LDH 176 12/31/2021      RADIOGRAPHIC STUDIES: I have personally reviewed the radiological images as listed and agreed with the findings in the report. No results found.  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 CBC within normal limits with a hemoglobin of 13.8, hematocrit of 41.3 and platelets slightly elevated at 403k With the platelets and  hematocrit are at the  target goal. Patient notes no significant toxicity from her current dose of hydroxyurea. We shall continue hydroxyurea at 500 mg p.o. daily with food. Continue vitamin B complex 1 capsule p.o. daily Continue aspirin 81 mg p.o. daily Continue follow-up with primary care physician for age-appropriate vaccinations.  FOLLOW UP: RTC with Dr Irene Limbo with labs in 4 months  .The total time spent in the appointment was 20 minutes* .  All of the patient's questions were answered with apparent satisfaction. The patient knows to call the clinic with any problems, questions or concerns.   Sullivan Lone MD MS AAHIVMS [facility] Hematology/Oncology Physician [facility]  .*Total Encounter Time as defined by the Centers for Medicare and Medicaid Services includes, in addition to the face-to-face time of a patient visit (documented in the note above) non-face-to-face time: obtaining and reviewing outside history, ordering and reviewing medications, tests or procedures, care coordination (communications with other health care professionals or caregivers) and documentation in the medical record.  I, Melene Muller, am acting as scribe for Dr. Sullivan Lone, MD.  .I have reviewed the above documentation for accuracy and completeness, and I agree with the above. Brunetta Genera MD

## 2022-01-19 ENCOUNTER — Other Ambulatory Visit (HOSPITAL_COMMUNITY): Payer: Self-pay

## 2022-01-21 ENCOUNTER — Telehealth: Payer: Self-pay

## 2022-01-21 NOTE — Telephone Encounter (Signed)
Patient is interested in [facility] program graduation. I have included her September BP data readings. Patient has been compliant with readings no hypertensive/hypotensive readings. Patient was enrolled in April 2023.    BP Data:  Summary for September Average Systolic BP Level 600.45 mmHg Lowest Systolic BP Level 96 mmHg Highest Systolic BP Level 997 mmHg  Average Diastolic BP Level 74.14 mmHg Lowest Diastolic BP Level 42 mmHg Highest Diastolic BP Level 83 mmHg

## 2022-01-21 NOTE — Telephone Encounter (Signed)
Looks great.  Thanks MJP

## 2022-01-22 ENCOUNTER — Ambulatory Visit: Payer: Medicare PPO

## 2022-01-22 DIAGNOSIS — I2721 Secondary pulmonary arterial hypertension: Secondary | ICD-10-CM | POA: Diagnosis not present

## 2022-01-23 DIAGNOSIS — I10 Essential (primary) hypertension: Secondary | ICD-10-CM | POA: Diagnosis not present

## 2022-01-27 ENCOUNTER — Other Ambulatory Visit: Payer: Medicare PPO

## 2022-01-29 ENCOUNTER — Other Ambulatory Visit: Payer: Self-pay | Admitting: Cardiology

## 2022-01-29 DIAGNOSIS — I739 Peripheral vascular disease, unspecified: Secondary | ICD-10-CM

## 2022-02-04 DIAGNOSIS — B029 Zoster without complications: Secondary | ICD-10-CM | POA: Diagnosis not present

## 2022-02-04 DIAGNOSIS — X32XXXD Exposure to sunlight, subsequent encounter: Secondary | ICD-10-CM | POA: Diagnosis not present

## 2022-02-04 DIAGNOSIS — L570 Actinic keratosis: Secondary | ICD-10-CM | POA: Diagnosis not present

## 2022-02-12 ENCOUNTER — Encounter: Payer: Self-pay | Admitting: Cardiology

## 2022-02-12 ENCOUNTER — Ambulatory Visit: Payer: Medicare PPO | Admitting: Cardiology

## 2022-02-12 VITALS — BP 182/83 | HR 62 | Resp 16 | Ht 60.0 in | Wt 100.0 lb

## 2022-02-12 DIAGNOSIS — I10 Essential (primary) hypertension: Secondary | ICD-10-CM

## 2022-02-12 DIAGNOSIS — I2721 Secondary pulmonary arterial hypertension: Secondary | ICD-10-CM | POA: Diagnosis not present

## 2022-02-12 NOTE — Progress Notes (Unsigned)
Patient referred by Haywood Pao, MD for leg edema  Subjective:   [name], female    DOB: [birth year], 88 y.o.   MRN: [id number]   Chief Complaint  Patient presents with   Hypertension   Follow-up    6 month    HPI  88 y.o. Caucasian female with hypertension, CKD 3a, COPD, pulmonary hypertension, multinodular goiter w/h/o thyrotoxicosis, polycythemia vera  Patient is doing well.  Other than stable leg edema, she has no other symptoms.  Denies shortness of breath, chest pain, presyncope, syncope.   Initial consultation HPI 06/2020: Patient reports episodes of palpitations, mostly at night. Episodes have increase din frequency. Blood pressure is elevated today, was noted to be 152/80 mmHg at recent PCP visit. She denies chest pain, shortness of breath, palpitations, orthopnea, PND, TIA/syncope. She reports leg edema. She tells me she is known to have PAD, her feet stay cold, but denies any claudication symptoms.  She is seeing hematologist Dr. Irene Limbo for thrombocytosis, has upcoming blood work pending to differentiate between clonal and reactive causes.     Current Outpatient Medications:    aspirin 81 MG tablet, Take 81 mg by mouth daily., Disp: , Rfl:    b complex vitamins capsule, Take 1 capsule by mouth daily., Disp: , Rfl:    calcium-vitamin D 250-100 MG-UNIT per tablet, Take 1 tablet by mouth 2 (two) times daily., Disp: , Rfl:    cetirizine (ZYRTEC) 10 MG tablet, Take 10 mg by mouth daily., Disp: , Rfl:    diltiazem (DILACOR XR) 180 MG 24 hr capsule, Take 180 mg by mouth daily., Disp: , Rfl:    furosemide (LASIX) 40 MG tablet, TAKE 1 TABLET BY MOUTH TWICE DAILY, Disp: 120 tablet, Rfl: 3   hydroxyurea (HYDREA) 500 MG capsule, TAKE 1 CAPSULE BY MOUTH ONCE A DAY. MAY TAKE WITH FOOD TO MINIMIZE GI SIDE EFFECTS, Disp: 30 capsule, Rfl: 1   losartan (COZAAR) 100 MG tablet, TAKE 1 TABLET BY MOUTH DAILY, Disp: 90 tablet, Rfl: 3   methimazole (TAPAZOLE) 5 MG tablet, Take 5  mg by mouth daily., Disp: , Rfl:    potassium chloride SA (KLOR-CON M) 20 MEQ tablet, TAKE 1 TABLET BY MOUTH TWICE DAILY, Disp: 120 tablet, Rfl: 2   psyllium (METAMUCIL) 58.6 % packet, Take 1 packet by mouth daily., Disp: , Rfl:    timolol (TIMOPTIC) 0.5 % ophthalmic solution, Place 1 drop into both eyes daily., Disp: , Rfl:   Cardiovascular and other pertinent studies:  EKG 02/12/2022: Sinus rhythm 63 bpm LAFB Frequent PVCs  02/12/2022   1058  Six Minute Walk   Medications taken before test (dose and time) --  Supplemental oxygen during test? No  Lap distance in meters  20 meters  Laps Completed  14  Partial lap (in meters) --  Baseline BP (sitting) --  Baseline Heartrate 70  Baseline Dyspnea (Borg Scale) --  Baseline Fatigue (Borg Scale) --  Baseline SPO2 96 %  Interval Oxygen Saturation and HR    2 Minute Oxygen Saturation % 85 %  2 Minute HR 75  4 Minute Oxygen Saturation % 94 %  4 Minute HR 80  6 Minute Oxygen Saturation % 84 %  6 Minute HR 81    Echocardiogram 01/22/2022:  Left ventricle cavity is normal in size. Mild concentric hypertrophy of  the left ventricle. Systolic septal flattening suggests RV pressure  overload.  Ventricular bigeminy seen throughout the study Normal LV  systolic function with  EF 66%. Indeterminate diastolic filling pattern.  Left atrial cavity is mildly dilated.  Right atrial cavity is severely dilated.  Right ventricle cavity is severely dilated. Normal right ventricular  function.  Structurally normal trileaflet aortic valve.  Mild (Grade I) aortic  regurgitation.  Mild (Grade I) mitral regurgitation.  Severe tricuspid regurgitation. Estimated pulmonary artery systolic  pressure 51 mmHg. RVSP measures 51 mmHg.  Small circumferential pericardial effusion. No evidence of tamponade.  Compared to previous study on 08/04/2021, ventricular bigeminy and  pericardial effusion are more prominent.   ABI 07/11/2020:  This exam reveals normal  perfusion of the right lower extremity (ABI 1.00)  with triphasic waveform.    This exam reveals mildly decreased perfusion of the left lower extremity,  noted at the post tibial artery level (ABI 0.84) with mildly abnormal  biphasic waveform. Left AT appears to be diffusely diseased.  Mobile cardiac telemetry 7 days 06/26/2020 - 07/03/2020: Dominant rhythm: Sinus. HR 58-96 bpm. Avg HR 68 bpm, in sinus rhythm. 12 episodes of SVT/atrial tachycardia, fastest at 162 bpm for 8 beats, longest for 7 beatsat 109 bpm. 7.2% isolated SVE, <1 couplet/triplets. <1% isolated VE, couplets No atrial fibrillation/atrial flutter//VT/high grade AV block, sinus pause >3sec noted. 10 patient triggered events, most correlated with SVE.    Recent labs: 12/31/2021: Glucose 103, BUN/Cr 22/1.09. EGFR 49. Na/K 141/3.9. Rest of the CMP normal H/H 13/41. MCV 116. Platelets 403  09/06/2020: Chol 149, TG 60, HDL 74, LDL 63   Review of Systems  Constitutional: Positive for malaise/fatigue.  Cardiovascular:  Positive for leg swelling. Negative for chest pain, dyspnea on exertion, palpitations and syncope.         Vitals:   02/12/22 1037  BP: (!) 182/83  Pulse: 62  Resp: 16  SpO2: 98%     Body mass index is 19.53 kg/m. Filed Weights   02/12/22 1037  Weight: 100 lb (45.4 kg)         Objective:   Physical Exam Vitals and nursing note reviewed.  Constitutional:      General: She is not in acute distress.    Appearance: She is well-developed.  HENT:     Head: Normocephalic and atraumatic.  Eyes:     Conjunctiva/sclera: Conjunctivae normal.     Pupils: Pupils are equal, round, and reactive to light.  Neck:     Vascular: No JVD.  Cardiovascular:     Rate and Rhythm: Normal rate and regular rhythm.     Pulses: Intact distal pulses.          Dorsalis pedis pulses are 1+ on the right side and 1+ on the left side.       Posterior tibial pulses are 0 on the right side and 0 on the left side.      Heart sounds: Murmur heard.     Holosystolic murmur is present with a grade of 3/6 at the lower right sternal border.  Pulmonary:     Effort: Pulmonary effort is normal.     Breath sounds: Normal breath sounds. No wheezing or rales.  Abdominal:     General: Bowel sounds are normal.     Palpations: Abdomen is soft.     Tenderness: There is no rebound.  Musculoskeletal:        General: No tenderness. Normal range of motion.     Right lower leg: Edema (2+) present.     Left lower leg: Edema (2+) present.  Lymphadenopathy:     Cervical: No  cervical adenopathy.  Skin:    General: Skin is warm and dry.  Neurological:     Mental Status: She is alert and oriented to person, place, and time.     Cranial Nerves: No cranial nerve deficit.         Assessment & Recommendations:   88 y.o. Caucasian female with hypertension, CKD 3a, ?COPD, pulmonary hypertension, multinodular goiter w/h/o thyrotoxicosis, polycythemia vera  Pulmonary hypertension: Longstanding pulmonary hypertension, could either be WHO group 1 or 2.  Her history of polycythemia vera makes associated pulmonary hypertension likely.  Other than leg edema, she denies any symptoms. However, contrary to her admitted symptoms, her 6 min walk test distance has significantly dropped from 400 ft in the past to 280 ft. I have explained to her the natural history of symptomatic pumonary hypertension. However, she remains clear that she does not want to pursue any invasive evaluation or treatment given her age. She would like to see me back in 3 months for further follow up and monitoring.   Hypertension: Remote patient monitoring blood pressure numbers have been normal.  I suspect component of whitecoat hypertension.  PAD: Mild PAD LLE. Continue risk factor modification In absence of bleeding, okay to continue aspirin.   Not on statin, lipids well controlled.  F/u in 3 months   Nigel Mormon, MD Pager: [phone] Office:  [phone]

## 2022-02-18 ENCOUNTER — Encounter: Payer: Self-pay | Admitting: Cardiology

## 2022-02-18 ENCOUNTER — Other Ambulatory Visit: Payer: Self-pay | Admitting: Hematology

## 2022-02-18 ENCOUNTER — Other Ambulatory Visit (HOSPITAL_COMMUNITY): Payer: Self-pay

## 2022-02-18 MED ORDER — HYDROXYUREA 500 MG PO CAPS
500.0000 mg | ORAL_CAPSULE | Freq: Every day | ORAL | 1 refills | Status: DC
  Filled 2022-02-18: qty 30, 30d supply, fill #0
  Filled 2022-03-26: qty 30, 30d supply, fill #1

## 2022-03-26 ENCOUNTER — Other Ambulatory Visit (HOSPITAL_COMMUNITY): Payer: Self-pay

## 2022-03-29 DIAGNOSIS — J019 Acute sinusitis, unspecified: Secondary | ICD-10-CM | POA: Diagnosis not present

## 2022-03-29 DIAGNOSIS — R059 Cough, unspecified: Secondary | ICD-10-CM | POA: Diagnosis not present

## 2022-03-29 DIAGNOSIS — R0981 Nasal congestion: Secondary | ICD-10-CM | POA: Diagnosis not present

## 2022-03-29 DIAGNOSIS — R509 Fever, unspecified: Secondary | ICD-10-CM | POA: Diagnosis not present

## 2022-03-30 ENCOUNTER — Telehealth: Payer: Self-pay

## 2022-03-30 NOTE — Telephone Encounter (Signed)
Returned call to pt: Pt stated she has a cold, cough and on and off low grade fever. Pt saw Urgent Care provider yesterday and they prescribed abx, cough suppressant and prednisone. Pt wanted the make sure  she was okay to take prednisone. Per Dr Irene Limbo okay to take prednisone . Pt to hold her hydroxyurea for 2 weeks until she feels better. Pt acknowledged information and verbalized understanding

## 2022-03-30 NOTE — Telephone Encounter (Signed)
Yes  Thanks MJP

## 2022-03-30 NOTE — Telephone Encounter (Signed)
Pt aware.

## 2022-03-30 NOTE — Telephone Encounter (Signed)
PATIENT WAS RECENTLY PRESCRIBED PREDNISONE FOR UPPER RESP INFECTION, SHE ASKED IF THIS WAS OK TO TAKE

## 2022-03-31 ENCOUNTER — Telehealth: Payer: Self-pay

## 2022-03-31 NOTE — Telephone Encounter (Signed)
VM 1110: Patient called and left a  message .  Message is as follows:  "My name is [name] birthday to 2032/11/03 telephone number is [phone]. I have an upper respiratory infection with a dry cough and drippy nose and a had a fever yesterday we went to the emergent care at [facility] and the doc the man he wasn't a doctor but anyway the person who saw me prescribed prednisone along with a cough medicine and a zythromycin and mucus D package. My question is, should I even take the prednisone at all?  I read the warnings, such as high BP, heart failure, glaucoma, but particularly heart problems. My question again is, Should I be taking Prednisone at all?"   Please advise.

## 2022-04-06 DIAGNOSIS — I272 Pulmonary hypertension, unspecified: Secondary | ICD-10-CM | POA: Diagnosis not present

## 2022-04-06 DIAGNOSIS — I071 Rheumatic tricuspid insufficiency: Secondary | ICD-10-CM | POA: Diagnosis not present

## 2022-04-06 DIAGNOSIS — I341 Nonrheumatic mitral (valve) prolapse: Secondary | ICD-10-CM | POA: Diagnosis not present

## 2022-04-06 DIAGNOSIS — R110 Nausea: Secondary | ICD-10-CM | POA: Diagnosis not present

## 2022-04-06 DIAGNOSIS — I129 Hypertensive chronic kidney disease with stage 1 through stage 4 chronic kidney disease, or unspecified chronic kidney disease: Secondary | ICD-10-CM | POA: Diagnosis not present

## 2022-04-06 DIAGNOSIS — N1831 Chronic kidney disease, stage 3a: Secondary | ICD-10-CM | POA: Diagnosis not present

## 2022-05-01 ENCOUNTER — Other Ambulatory Visit: Payer: Self-pay | Admitting: *Deleted

## 2022-05-01 DIAGNOSIS — Z1589 Genetic susceptibility to other disease: Secondary | ICD-10-CM

## 2022-05-04 ENCOUNTER — Inpatient Hospital Stay: Payer: Medicare PPO | Attending: Hematology

## 2022-05-04 ENCOUNTER — Inpatient Hospital Stay: Payer: Medicare PPO | Admitting: Hematology

## 2022-05-04 ENCOUNTER — Other Ambulatory Visit: Payer: Self-pay

## 2022-05-04 VITALS — BP 166/67 | HR 72 | Temp 97.8°F | Resp 15 | Ht 60.0 in | Wt 100.7 lb

## 2022-05-04 DIAGNOSIS — Z1589 Genetic susceptibility to other disease: Secondary | ICD-10-CM | POA: Diagnosis not present

## 2022-05-04 DIAGNOSIS — D471 Chronic myeloproliferative disease: Secondary | ICD-10-CM | POA: Diagnosis not present

## 2022-05-04 DIAGNOSIS — D45 Polycythemia vera: Secondary | ICD-10-CM | POA: Insufficient documentation

## 2022-05-04 DIAGNOSIS — D7589 Other specified diseases of blood and blood-forming organs: Secondary | ICD-10-CM | POA: Diagnosis not present

## 2022-05-04 LAB — CMP (CANCER CENTER ONLY)
ALT: 21 U/L (ref 0–44)
AST: 25 U/L (ref 15–41)
Albumin: 3.8 g/dL (ref 3.5–5.0)
Alkaline Phosphatase: 87 U/L (ref 38–126)
Anion gap: 2 — ABNORMAL LOW (ref 5–15)
BUN: 19 mg/dL (ref 8–23)
CO2: 35 mmol/L — ABNORMAL HIGH (ref 22–32)
Calcium: 9.5 mg/dL (ref 8.9–10.3)
Chloride: 104 mmol/L (ref 98–111)
Creatinine: 0.91 mg/dL (ref 0.44–1.00)
GFR, Estimated: >60 mL/min (ref >60)
Glucose, Bld: 82 mg/dL (ref 70–99)
Potassium: 3.8 mmol/L (ref 3.5–5.1)
Sodium: 141 mmol/L (ref 135–145)
Total Bilirubin: 0.7 mg/dL (ref 0.3–1.2)
Total Protein: 6.6 g/dL (ref 6.5–8.1)

## 2022-05-04 LAB — CBC WITH DIFFERENTIAL (CANCER CENTER ONLY)
Abs Immature Granulocytes: 0.02 10*3/uL (ref 0.00–0.07)
Basophils Absolute: 0.1 10*3/uL (ref 0.0–0.1)
Basophils Relative: 1 %
Eosinophils Absolute: 0.1 10*3/uL (ref 0.0–0.5)
Eosinophils Relative: 2 %
HCT: 42 % (ref 36.0–46.0)
Hemoglobin: 13.9 g/dL (ref 12.0–15.0)
Immature Granulocytes: 0 %
Lymphocytes Relative: 14 %
Lymphs Abs: 1 10*3/uL (ref 0.7–4.0)
MCH: 37.4 pg — ABNORMAL HIGH (ref 26.0–34.0)
MCHC: 33.1 g/dL (ref 30.0–36.0)
MCV: 112.9 fL — ABNORMAL HIGH (ref 80.0–100.0)
Monocytes Absolute: 0.6 10*3/uL (ref 0.1–1.0)
Monocytes Relative: 9 %
Neutro Abs: 5.3 10*3/uL (ref 1.7–7.7)
Neutrophils Relative %: 74 %
Platelet Count: 484 10*3/uL — ABNORMAL HIGH (ref 150–400)
RBC: 3.72 MIL/uL — ABNORMAL LOW (ref 3.87–5.11)
RDW: 14.2 % (ref 11.5–15.5)
WBC Count: 7.1 10*3/uL (ref 4.0–10.5)
nRBC: 0 % (ref 0.0–0.2)

## 2022-05-04 LAB — LACTATE DEHYDROGENASE: LDH: 163 U/L (ref 98–192)

## 2022-05-04 NOTE — Progress Notes (Signed)
HEMATOLOGY/ONCOLOGY CLINIC NOTE  Date of Service: 05/04/22   Patient Care Team: [name], Fransico Him, MD as PCP - General (Internal Medicine)  CHIEF COMPLAINTS/PURPOSE OF CONSULTATION:  Follow-up for continued evaluation and management of polycythemia vera  HISTORY OF PRESENTING ILLNESS:   Please see previous notes on initial presentation of disease  INTERVAL HISTORY  [name] is here for continued evaluation and management of polycythemia vera.   Patient was last seen by me on 12/31/2021 and she was doing well overall. Patient called our office on 03/30/2022 and complained of cold, intermittent cough, and low grade fever. Pt went to the Urgent care on 03/29/2022 and was prescribed cough suppressant and prednisone 10 mg x 21 tablets. Patient was advised to hold her hydroxy urea until she got better.  Patient is accompanied by her husband during this visit and she reports she has been doing well overall. Patient reports that either Prednisone or her cough suppressant, which was prescribed last month for upper respiratory illness, caused her nausea. She reports that her upper respiratory illness/infection has cleared up since last month. She does complain of mild occasional cough, but not severe.   Patient has started her Hydroxyurea back after her infection.   Patient denies fever, chills, night sweats, abdominal pain, back pain. She does complain of mild leg bilateral leg swelling.   She reports she does not stay well hydrated.   Patient notes that her PCP has prescribed her with Potassium Chloride 20 MEQ twice daily. However, she reports she has only been taking Potassium chloride 20 MEQ once a day.   She has received her influenza vaccine, COVID-19 Booster, and RSV vaccine. She is up to date with all vaccines.   MEDICAL HISTORY:  Past Medical History:  Diagnosis Date   Osteopenia    Thyroid disease    Varicose veins VENOUS INSUFFICIENCY SLOW HEALING OF WOUNDS  Charissa Bash TO LOWER EXTEMETIES   Venous stasis dermatitis     SURGICAL HISTORY: Past Surgical History:  Procedure Laterality Date   BREAST CYST ASPIRATION Left    EYE SURGERY     TONSILLECTOMY      SOCIAL HISTORY: Social History   Socioeconomic History   Marital status: Married    Spouse name: Not on file   Number of children: 2   Years of education: Not on file   Highest education level: Not on file  Occupational History   Not on file  Tobacco Use   Smoking status: Never   Smokeless tobacco: Never  Vaping Use   Vaping Use: Never used  Substance and Sexual Activity   Alcohol use: No   Drug use: Never   Sexual activity: Not on file  Other Topics Concern   Not on file  Social History Narrative   Not on file   Social Determinants of Health   Financial Resource Strain: Not on file  Food Insecurity: Not on file  Transportation Needs: Not on file  Physical Activity: Not on file  Stress: Not on file  Social Connections: Not on file  Intimate Partner Violence: Not on file    FAMILY HISTORY: Family History  Problem Relation Age of Onset   Heart disease Father    Cancer Son    Cancer Maternal Aunt    Cancer Paternal Uncle    Cancer Maternal Grandfather    Breast cancer Neg Hx     ALLERGIES:  is allergic to doxycycline calcium and penicillin v.  MEDICATIONS:  Current Outpatient Medications  Medication Sig Dispense Refill   aspirin 81 MG tablet Take 81 mg by mouth daily.     b complex vitamins capsule Take 1 capsule by mouth daily.     calcium-vitamin D 250-100 MG-UNIT per tablet Take 1 tablet by mouth 2 (two) times daily.     cetirizine (ZYRTEC) 10 MG tablet Take 10 mg by mouth daily.     diltiazem (DILACOR XR) 180 MG 24 hr capsule Take 180 mg by mouth daily.     furosemide (LASIX) 40 MG tablet TAKE 1 TABLET BY MOUTH TWICE DAILY 120 tablet 3   hydroxyurea (HYDREA) 500 MG capsule Take 1 capsule (500 mg total) by mouth daily. May take with food to minimize GI  side effects. 30 capsule 1   losartan (COZAAR) 100 MG tablet TAKE 1 TABLET BY MOUTH DAILY 90 tablet 3   methimazole (TAPAZOLE) 5 MG tablet Take 5 mg by mouth daily.     potassium chloride SA (KLOR-CON M) 20 MEQ tablet TAKE 1 TABLET BY MOUTH TWICE DAILY 120 tablet 2   psyllium (METAMUCIL) 58.6 % packet Take 1 packet by mouth daily.     timolol (TIMOPTIC) 0.5 % ophthalmic solution Place 1 drop into both eyes daily.     No current facility-administered medications for this visit.    REVIEW OF SYSTEMS:   10 Point review of Systems was done is negative except as noted above.  PHYSICAL EXAMINATION: ECOG PERFORMANCE STATUS: 2  . Vitals:   05/04/22 1001  BP: (!) 166/67  Pulse: 72  Resp: 15  Temp: 97.8 F (36.6 C)  SpO2: 100%     Filed Weights   05/04/22 1001  Weight: 100 lb 11.2 oz (45.7 kg)     .Body mass index is 19.67 kg/m. NAD GENERAL:alert, in no acute distress and comfortable SKIN: no acute rashes, no significant lesions EYES: conjunctiva are pink and non-injected, sclera anicteric NECK: supple, no JVD LYMPH:  no palpable lymphadenopathy in the cervical, axillary or inguinal regions LUNGS: clear to auscultation b/l with normal respiratory effort HEART: regular rate & rhythm ABDOMEN:  normoactive bowel sounds , non tender, not distended. Extremity: no pedal edema PSYCH: alert & oriented x 3 with fluent speech NEURO: no focal motor/sensory deficits   LABORATORY DATA:  I have reviewed the data as listed     Latest Ref Rng & Units 05/04/2022    9:33 AM 12/31/2021   11:34 AM 09/30/2021    2:32 PM  CBC  WBC 4.0 - 10.5 K/uL 7.1  6.6  7.2   Hemoglobin 12.0 - 15.0 g/dL 13.9  13.8  13.5   Hematocrit 36.0 - 46.0 % 42.0  41.3  40.1   Platelets 150 - 400 K/uL 484  403  366     .    Latest Ref Rng & Units 05/04/2022    9:33 AM 12/31/2021   11:34 AM 09/30/2021    2:32 PM  CMP  Glucose 70 - 99 mg/dL 82  103  116   BUN 8 - 23 mg/dL '19  22  23   '$ Creatinine 0.44 - 1.00  mg/dL 0.91  1.09  1.12   Sodium 135 - 145 mmol/L 141  141  142   Potassium 3.5 - 5.1 mmol/L 3.8  3.9  3.9   Chloride 98 - 111 mmol/L 104  104  104   CO2 22 - 32 mmol/L 35  33  34   Calcium 8.9 - 10.3 mg/dL 9.5  9.9  9.8  Total Protein 6.5 - 8.1 g/dL 6.6  7.0  7.2   Total Bilirubin 0.3 - 1.2 mg/dL 0.7  0.6  0.6   Alkaline Phos 38 - 126 U/L 87  78  75   AST 15 - 41 U/L '25  27  25   '$ ALT 0 - 44 U/L '21  18  19    '$ . Lab Results  Component Value Date   LDH 163 05/04/2022      RADIOGRAPHIC STUDIES: I have personally reviewed the radiological images as listed and agreed with the findings in the report. No results found.  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 CBC shows slightly elevated platelets of 484 K.  CMP stable. -Advised the patient to use precautions around public especially for the next 2 months due to winter infections.  -Answered all of patient's questions regarding hydroxyurea and other questions about polycythemia vera. -Patient notes no significant toxicity from her current dose of hydroxyurea. -Continue hydroxyurea at 500 mg p.o. daily with food. -Continue vitamin B complex 1 capsule p.o. daily -Continue aspirin 81 mg p.o. daily -Continue follow-up with primary care physician for age-appropriate vaccinations.  FOLLOW-UP: RTC with Dr Irene Limbo with labs in 4 months  The total time spent in the appointment was 20 minutes* .  All of the patient's questions were answered with apparent satisfaction. The patient knows to call the clinic with any problems, questions or concerns.   Sullivan Lone MD MS AAHIVMS [facility] Hematology/Oncology Physician [facility]  .*Total Encounter Time as defined by the Centers for Medicare and Medicaid Services includes, in addition to  the face-to-face time of a patient visit (documented in the note above) non-face-to-face time: obtaining and reviewing outside history, ordering and reviewing medications, tests or procedures, care coordination (communications with other health care professionals or caregivers) and documentation in the medical record.   I, Cleda Mccreedy, am acting as a Education administrator for Sullivan Lone, MD.  .I have reviewed the above documentation for accuracy and completeness, and I agree with the above. Brunetta Genera MD

## 2022-05-14 ENCOUNTER — Other Ambulatory Visit: Payer: Self-pay | Admitting: Hematology

## 2022-05-14 ENCOUNTER — Other Ambulatory Visit (HOSPITAL_COMMUNITY): Payer: Self-pay

## 2022-05-14 MED ORDER — HYDROXYUREA 500 MG PO CAPS
500.0000 mg | ORAL_CAPSULE | Freq: Every day | ORAL | 1 refills | Status: DC
  Filled 2022-05-14: qty 30, 30d supply, fill #0
  Filled 2022-06-18: qty 30, 30d supply, fill #1

## 2022-05-15 ENCOUNTER — Ambulatory Visit: Payer: Medicare PPO | Admitting: Cardiology

## 2022-05-15 ENCOUNTER — Encounter: Payer: Self-pay | Admitting: Cardiology

## 2022-05-15 VITALS — BP 160/70 | HR 68 | Resp 16 | Ht 60.0 in | Wt 100.0 lb

## 2022-05-15 DIAGNOSIS — I739 Peripheral vascular disease, unspecified: Secondary | ICD-10-CM | POA: Diagnosis not present

## 2022-05-15 DIAGNOSIS — I2721 Secondary pulmonary arterial hypertension: Secondary | ICD-10-CM

## 2022-05-15 DIAGNOSIS — I10 Essential (primary) hypertension: Secondary | ICD-10-CM | POA: Diagnosis not present

## 2022-05-15 MED ORDER — TORSEMIDE 20 MG PO TABS: 20.0000 mg | ORAL_TABLET | Freq: Two times a day (BID) | ORAL | 3 refills | Status: DC

## 2022-05-15 NOTE — Progress Notes (Signed)
Patient referred by Haywood Pao, MD for leg edema  Subjective:   [name], female    DOB: [birth year], 89 y.o.   MRN: [id number]   Chief Complaint  Patient presents with   Hypertension   Follow-up    3 month    HPI  89 y.o. Caucasian female with hypertension, CKD 3a, COPD, pulmonary hypertension, multinodular goiter w/h/o thyrotoxicosis, polycythemia vera  She continues to have bilateral leg swelling but no other significant symptoms. She is going to move to [facility] and is stressed about it.    Initial consultation HPI 06/2020: Patient reports episodes of palpitations, mostly at night. Episodes have increase din frequency. Blood pressure is elevated today, was noted to be 152/80 mmHg at recent PCP visit. She denies chest pain, shortness of breath, palpitations, orthopnea, PND, TIA/syncope. She reports leg edema. She tells me she is known to have PAD, her feet stay cold, but denies any claudication symptoms.  She is seeing hematologist Dr. Irene Limbo for thrombocytosis, has upcoming blood work pending to differentiate between clonal and reactive causes.     Current Outpatient Medications:    aspirin 81 MG tablet, Take 81 mg by mouth daily., Disp: , Rfl:    b complex vitamins capsule, Take 1 capsule by mouth daily., Disp: , Rfl:    calcium-vitamin D 250-100 MG-UNIT per tablet, Take 1 tablet by mouth 2 (two) times daily., Disp: , Rfl:    cetirizine (ZYRTEC) 10 MG tablet, Take 10 mg by mouth daily., Disp: , Rfl:    diltiazem (DILACOR XR) 180 MG 24 hr capsule, Take 180 mg by mouth daily., Disp: , Rfl:    furosemide (LASIX) 40 MG tablet, TAKE 1 TABLET BY MOUTH TWICE DAILY, Disp: 120 tablet, Rfl: 3   hydroxyurea (HYDREA) 500 MG capsule, Take 1 capsule (500 mg total) by mouth daily. May take with food to minimize GI side effects., Disp: 30 capsule, Rfl: 1   losartan (COZAAR) 100 MG tablet, TAKE 1 TABLET BY MOUTH DAILY, Disp: 90 tablet, Rfl: 3   methimazole (TAPAZOLE) 5 MG  tablet, Take 5 mg by mouth daily., Disp: , Rfl:    potassium chloride SA (KLOR-CON M) 20 MEQ tablet, TAKE 1 TABLET BY MOUTH TWICE DAILY, Disp: 120 tablet, Rfl: 2   psyllium (METAMUCIL) 58.6 % packet, Take 1 packet by mouth daily., Disp: , Rfl:    timolol (TIMOPTIC) 0.5 % ophthalmic solution, Place 1 drop into both eyes daily., Disp: , Rfl:   Cardiovascular and other pertinent studies:  EKG 02/12/2022: Sinus rhythm 63 bpm LAFB Frequent PVCs  02/12/2022   1058  Six Minute Walk   Medications taken before test (dose and time) --  Supplemental oxygen during test? No  Lap distance in meters  20 meters  Laps Completed  14  Partial lap (in meters) --  Baseline BP (sitting) --  Baseline Heartrate 70  Baseline Dyspnea (Borg Scale) --  Baseline Fatigue (Borg Scale) --  Baseline SPO2 96 %  Interval Oxygen Saturation and HR    2 Minute Oxygen Saturation % 85 %  2 Minute HR 75  4 Minute Oxygen Saturation % 94 %  4 Minute HR 80  6 Minute Oxygen Saturation % 84 %  6 Minute HR 81    Echocardiogram 01/22/2022:  Left ventricle cavity is normal in size. Mild concentric hypertrophy of  the left ventricle. Systolic septal flattening suggests RV pressure  overload.  Ventricular bigeminy seen throughout the study Normal LV  systolic function with EF 66%. Indeterminate diastolic filling pattern.  Left atrial cavity is mildly dilated.  Right atrial cavity is severely dilated.  Right ventricle cavity is severely dilated. Normal right ventricular  function.  Structurally normal trileaflet aortic valve.  Mild (Grade I) aortic  regurgitation.  Mild (Grade I) mitral regurgitation.  Severe tricuspid regurgitation. Estimated pulmonary artery systolic  pressure 51 mmHg. RVSP measures 51 mmHg.  Small circumferential pericardial effusion. No evidence of tamponade.  Compared to previous study on 08/04/2021, ventricular bigeminy and  pericardial effusion are more prominent.   ABI 07/11/2020:  This exam  reveals normal perfusion of the right lower extremity (ABI 1.00)  with triphasic waveform.    This exam reveals mildly decreased perfusion of the left lower extremity,  noted at the post tibial artery level (ABI 0.84) with mildly abnormal  biphasic waveform. Left AT appears to be diffusely diseased.  Mobile cardiac telemetry 7 days 06/26/2020 - 07/03/2020: Dominant rhythm: Sinus. HR 58-96 bpm. Avg HR 68 bpm, in sinus rhythm. 12 episodes of SVT/atrial tachycardia, fastest at 162 bpm for 8 beats, longest for 7 beatsat 109 bpm. 7.2% isolated SVE, <1 couplet/triplets. <1% isolated VE, couplets No atrial fibrillation/atrial flutter//VT/high grade AV block, sinus pause >3sec noted. 10 patient triggered events, most correlated with SVE.    Recent labs: 12/31/2021: Glucose 103, BUN/Cr 22/1.09. EGFR 49. Na/K 141/3.9. Rest of the CMP normal H/H 13/41. MCV 116. Platelets 403  09/06/2020: Chol 149, TG 60, HDL 74, LDL 63   Review of Systems  Constitutional: Positive for malaise/fatigue.  Cardiovascular:  Positive for leg swelling. Negative for chest pain, dyspnea on exertion, palpitations and syncope.         Vitals:   05/15/22 1254 05/15/22 1256  BP: (!) 179/69 (!) 160/70  Pulse: 67 68  Resp: 16   SpO2: 97%      Body mass index is 19.53 kg/m. Filed Weights   05/15/22 1254  Weight: 100 lb (45.4 kg)       Objective:   Physical Exam Vitals and nursing note reviewed.  Constitutional:      General: She is not in acute distress.    Appearance: She is well-developed.  HENT:     Head: Normocephalic and atraumatic.  Eyes:     Conjunctiva/sclera: Conjunctivae normal.     Pupils: Pupils are equal, round, and reactive to light.  Neck:     Vascular: No JVD.  Cardiovascular:     Rate and Rhythm: Normal rate and regular rhythm.     Pulses: Intact distal pulses.          Dorsalis pedis pulses are 1+ on the right side and 1+ on the left side.       Posterior tibial pulses are 0 on  the right side and 0 on the left side.     Heart sounds: Murmur heard.     Holosystolic murmur is present with a grade of 3/6 at the lower right sternal border.  Pulmonary:     Effort: Pulmonary effort is normal.     Breath sounds: Normal breath sounds. No wheezing or rales.  Abdominal:     General: Bowel sounds are normal.     Palpations: Abdomen is soft.     Tenderness: There is no rebound.  Musculoskeletal:        General: No tenderness. Normal range of motion.     Right lower leg: Edema (2+) present.     Left lower leg: Edema (2+) present.  Lymphadenopathy:     Cervical: No cervical adenopathy.  Skin:    General: Skin is warm and dry.  Neurological:     Mental Status: She is alert and oriented to person, place, and time.     Cranial Nerves: No cranial nerve deficit.         Assessment & Recommendations:   89 y.o. Caucasian female with hypertension, CKD 3a, ?COPD, pulmonary hypertension, multinodular goiter w/h/o thyrotoxicosis, polycythemia vera  Pulmonary hypertension: Longstanding pulmonary hypertension, could either be WHO group 1 or 2.  Her history of polycythemia vera makes associated pulmonary hypertension likely.  Other than leg edema, she denies any symptoms. However, contrary to her admitted symptoms, her 6 min walk test distance has significantly dropped from 400 ft in the past to 280 ft. I have explained to her the natural history of symptomatic pumonary hypertension. However, she is reluctant to pursue any invasive evaluation or treatment given her age.  I will change her lasix 40 mg that she takes 1-2 time a day to torsemide 20 mg bid. Will check echocardiogram and 6 min walk test in early April. If leg edema worsening or 6 min walk test is reducing further, will discuss RHC again at that time.  Hypertension: Remote patient monitoring blood pressure numbers have been normal.  I suspect component of whitecoat hypertension.  PAD: Mild PAD LLE. Continue risk  factor modification In absence of bleeding, okay to continue aspirin.   Not on statin, lipids well controlled.  F/u in 3 months   Nigel Mormon, MD Pager: [phone] Office: [phone]

## 2022-06-29 DIAGNOSIS — I272 Pulmonary hypertension, unspecified: Secondary | ICD-10-CM | POA: Diagnosis not present

## 2022-06-29 DIAGNOSIS — I071 Rheumatic tricuspid insufficiency: Secondary | ICD-10-CM | POA: Diagnosis not present

## 2022-06-29 DIAGNOSIS — N1831 Chronic kidney disease, stage 3a: Secondary | ICD-10-CM | POA: Diagnosis not present

## 2022-06-29 DIAGNOSIS — I517 Cardiomegaly: Secondary | ICD-10-CM | POA: Diagnosis not present

## 2022-06-29 DIAGNOSIS — J449 Chronic obstructive pulmonary disease, unspecified: Secondary | ICD-10-CM | POA: Diagnosis not present

## 2022-06-29 DIAGNOSIS — I341 Nonrheumatic mitral (valve) prolapse: Secondary | ICD-10-CM | POA: Diagnosis not present

## 2022-06-29 DIAGNOSIS — I739 Peripheral vascular disease, unspecified: Secondary | ICD-10-CM | POA: Diagnosis not present

## 2022-06-29 DIAGNOSIS — I129 Hypertensive chronic kidney disease with stage 1 through stage 4 chronic kidney disease, or unspecified chronic kidney disease: Secondary | ICD-10-CM | POA: Diagnosis not present

## 2022-06-29 DIAGNOSIS — D471 Chronic myeloproliferative disease: Secondary | ICD-10-CM | POA: Diagnosis not present

## 2022-07-10 ENCOUNTER — Other Ambulatory Visit: Payer: Self-pay | Admitting: Internal Medicine

## 2022-07-10 DIAGNOSIS — N63 Unspecified lump in unspecified breast: Secondary | ICD-10-CM

## 2022-07-22 ENCOUNTER — Other Ambulatory Visit (HOSPITAL_COMMUNITY): Payer: Self-pay

## 2022-07-22 ENCOUNTER — Other Ambulatory Visit: Payer: Self-pay | Admitting: Hematology

## 2022-07-22 MED ORDER — HYDROXYUREA 500 MG PO CAPS
500.0000 mg | ORAL_CAPSULE | Freq: Every day | ORAL | 1 refills | Status: DC
  Filled 2022-07-22: qty 30, 30d supply, fill #0
  Filled 2022-08-31: qty 30, 30d supply, fill #1

## 2022-07-24 ENCOUNTER — Other Ambulatory Visit: Payer: Self-pay | Admitting: Cardiology

## 2022-07-24 DIAGNOSIS — I10 Essential (primary) hypertension: Secondary | ICD-10-CM

## 2022-07-27 ENCOUNTER — Ambulatory Visit: Payer: Medicare PPO

## 2022-07-27 ENCOUNTER — Ambulatory Visit: Payer: Medicare PPO | Admitting: Cardiology

## 2022-07-27 DIAGNOSIS — I2721 Secondary pulmonary arterial hypertension: Secondary | ICD-10-CM

## 2022-07-28 ENCOUNTER — Encounter: Payer: Self-pay | Admitting: Cardiology

## 2022-07-28 ENCOUNTER — Ambulatory Visit: Payer: Medicare PPO | Admitting: Cardiology

## 2022-07-28 VITALS — BP 175/85 | HR 68 | Resp 18 | Ht 60.0 in | Wt 99.2 lb

## 2022-07-28 DIAGNOSIS — I10 Essential (primary) hypertension: Secondary | ICD-10-CM | POA: Diagnosis not present

## 2022-07-28 DIAGNOSIS — I2721 Secondary pulmonary arterial hypertension: Secondary | ICD-10-CM

## 2022-07-28 DIAGNOSIS — I739 Peripheral vascular disease, unspecified: Secondary | ICD-10-CM | POA: Diagnosis not present

## 2022-07-28 MED ORDER — TORSEMIDE 20 MG PO TABS: 20.0000 mg | ORAL_TABLET | Freq: Two times a day (BID) | ORAL | 3 refills | Status: DC

## 2022-07-28 NOTE — Progress Notes (Signed)
Patient referred by Gaspar Garbe, MD for leg edema  Subjective:   [name], female    DOB: Oct 27, 1932, 89 y.o.   MRN: [id number]   Chief Complaint  Patient presents with   Pulmonary hypertension    HPI  89 y.o. Caucasian female with hypertension, CKD 3a, COPD, pulmonary hypertension, multinodular goiter w/h/o thyrotoxicosis, polycythemia vera  Patient has been out of torsemide last 3 days. She has taken a dose of lasix 40 mg instead. Breathing is no worse than usual. She walked 380 ft in 6 min today. Blood pressure elevated in the office, but always normal at home and at other medical visits.   Initial consultation HPI 06/2020: Patient reports episodes of palpitations, mostly at night. Episodes have increase din frequency. Blood pressure is elevated today, was noted to be 152/80 mmHg at recent PCP visit. She denies chest pain, shortness of breath, palpitations, orthopnea, PND, TIA/syncope. She reports leg edema. She tells me she is known to have PAD, her feet stay cold, but denies any claudication symptoms.  She is seeing hematologist Dr. Candise Che for thrombocytosis, has upcoming blood work pending to differentiate between clonal and reactive causes.     Current Outpatient Medications:    aspirin 81 MG tablet, Take 81 mg by mouth daily., Disp: , Rfl:    b complex vitamins capsule, Take 1 capsule by mouth daily., Disp: , Rfl:    calcium-vitamin D 250-100 MG-UNIT per tablet, Take 1 tablet by mouth 2 (two) times daily., Disp: , Rfl:    cetirizine (ZYRTEC) 10 MG tablet, Take 10 mg by mouth daily., Disp: , Rfl:    diltiazem (DILACOR XR) 180 MG 24 hr capsule, Take 180 mg by mouth daily., Disp: , Rfl:    hydroxyurea (HYDREA) 500 MG capsule, Take 1 capsule (500 mg total) by mouth daily. May take with food to minimize GI side effects., Disp: 30 capsule, Rfl: 1   losartan (COZAAR) 100 MG tablet, TAKE 1 TABLET BY MOUTH DAILY, Disp: 90 tablet, Rfl: 3   methimazole (TAPAZOLE) 5 MG  tablet, Take 5 mg by mouth daily., Disp: , Rfl:    potassium chloride SA (KLOR-CON M) 20 MEQ tablet, TAKE 1 TABLET BY MOUTH TWICE DAILY, Disp: 120 tablet, Rfl: 2   psyllium (METAMUCIL) 58.6 % packet, Take 1 packet by mouth daily., Disp: , Rfl:    timolol (TIMOPTIC) 0.5 % ophthalmic solution, Place 1 drop into both eyes daily., Disp: , Rfl:    torsemide (DEMADEX) 20 MG tablet, Take 1 tablet (20 mg total) by mouth 2 (two) times daily., Disp: 120 tablet, Rfl: 3  Cardiovascular and other pertinent studies:  EKG 02/12/2022: Sinus rhythm 63 bpm LAFB Frequent PVCs  07/28/2022    1219  Six Minute Walk   Medications taken before test (dose and time) --  Supplemental oxygen during test? No  Lap distance in meters 20 meters  Laps Completed 19  Partial lap (in meters) 0 meters  Baseline BP (sitting) --  Baseline Heartrate 69  Baseline Dyspnea (Borg Scale) --  Baseline Fatigue (Borg Scale) --  Baseline SPO2 100 %  Interval Oxygen Saturation and HR   2 Minute Oxygen Saturation % 96 %  2 Minute HR 43  4 Minute Oxygen Saturation % 100 %  4 Minute HR 83  6 Minute Oxygen Saturation % 98 %  6 Minute HR 83  End of Test Values   BP (sitting) --  Heartrate 83  Dyspnea (Borg Scale) --  Fatigue (Borg Scale) --  SPO2 98 %  2 Minutes Post Walk Values   BP (sitting) --  Heartrate 43  SPO2 96 %  Stopped or paused before six minutes? No  Other Symptoms at end of exercise: --  Interpretation   Distance completed 380 meters    Echocardiogram 07/27/2022: Left ventricle cavity is normal in size and wall thickness. Normal global wall motion. Normal LV systolic function with EF 69%. Normal diastolic filling pattern.  Left atrial cavity is mildly dilated. Right atrial cavity is severely dilated. Right ventricle cavity is mildly dilated. Normal right ventricular function. Structurally normal trileaflet aortic valve.  Mild (Grade I) aortic regurgitation. Mild to moderate mitral  regurgitation. Moderate to severe tricuspid regurgitation. Estimated pulmonary artery systolic pressure 60 mmHg.  Mild pulmonic regurgitation. Trace pericardial effusion. Previous study on 01/22/2022 estimated PASP at 51 mmHg.  ABI 07/11/2020:  This exam reveals normal perfusion of the right lower extremity (ABI 1.00)  with triphasic waveform.    This exam reveals mildly decreased perfusion of the left lower extremity,  noted at the post tibial artery level (ABI 0.84) with mildly abnormal  biphasic waveform. Left AT appears to be diffusely diseased.  Mobile cardiac telemetry 7 days 06/26/2020 - 07/03/2020: Dominant rhythm: Sinus. HR 58-96 bpm. Avg HR 68 bpm, in sinus rhythm. 12 episodes of SVT/atrial tachycardia, fastest at 162 bpm for 8 beats, longest for 7 beatsat 109 bpm. 7.2% isolated SVE, <1 couplet/triplets. <1% isolated VE, couplets No atrial fibrillation/atrial flutter//VT/high grade AV block, sinus pause >3sec noted. 10 patient triggered events, most correlated with SVE.    Recent labs: 05/04/2022: Glucose 82, BUN/Cr 19/0.91. EGFR >60. Na/K 141/3.8. Rest of the CMP normal H/H 13.9/42. MCV 112. Platelets 484  09/06/2020: Chol 149, TG 60, HDL 74, LDL 63   Review of Systems  Constitutional: Positive for malaise/fatigue.  Cardiovascular:  Positive for leg swelling. Negative for chest pain, dyspnea on exertion, palpitations and syncope.         Vitals:   07/28/22 1154 07/28/22 1205  BP: (!) 180/50 (!) 175/85  Pulse: 60 68  Resp: 18   SpO2: 96% 96%     Body mass index is 19.37 kg/m. Filed Weights   07/28/22 1154  Weight: 99 lb 3.2 oz (45 kg)       Objective:   Physical Exam Vitals and nursing note reviewed.  Constitutional:      General: She is not in acute distress.    Appearance: She is well-developed.  HENT:     Head: Normocephalic and atraumatic.  Eyes:     Conjunctiva/sclera: Conjunctivae normal.     Pupils: Pupils are equal, round, and reactive  to light.  Neck:     Vascular: No JVD.  Cardiovascular:     Rate and Rhythm: Normal rate and regular rhythm.     Pulses: Intact distal pulses.          Dorsalis pedis pulses are 1+ on the right side and 1+ on the left side.       Posterior tibial pulses are 0 on the right side and 0 on the left side.     Heart sounds: Murmur heard.     Holosystolic murmur is present with a grade of 3/6 at the lower right sternal border.  Pulmonary:     Effort: Pulmonary effort is normal.     Breath sounds: Normal breath sounds. No wheezing or rales.  Abdominal:     General: Bowel sounds are normal.  Palpations: Abdomen is soft.     Tenderness: There is no rebound.  Musculoskeletal:        General: No tenderness. Normal range of motion.     Right lower leg: Edema (2+) present.     Left lower leg: Edema (2+) present.  Lymphadenopathy:     Cervical: No cervical adenopathy.  Skin:    General: Skin is warm and dry.  Neurological:     Mental Status: She is alert and oriented to person, place, and time.     Cranial Nerves: No cranial nerve deficit.        Assessment & Recommendations:   89 y.o. Caucasian female with hypertension, CKD 3a, ?COPD, pulmonary hypertension, multinodular goiter w/h/o thyrotoxicosis, polycythemia vera  Pulmonary hypertension: Longstanding pulmonary hypertension, could either be WHO group 1 or 2.  Her history of polycythemia vera makes associated pulmonary hypertension likely.  Other than leg edema, she denies any symptoms. 6 min walk distance is 380 ft. Continue torsemide 20 mg bid.  She wants to hold of RHC.   Hypertension: Remote patient monitoring blood pressure numbers have been normal.  I suspect component of whitecoat hypertension.  PAD: Mild PAD LLE. Continue risk factor modification In absence of bleeding, okay to continue aspirin.   Not on statin, lipids well controlled.  F/u in 3 months   Elder NegusManish J Mellany Dinsmore, MD Pager: [phone] Office:  [phone]

## 2022-08-17 ENCOUNTER — Emergency Department (HOSPITAL_COMMUNITY): Payer: Medicare PPO

## 2022-08-17 ENCOUNTER — Encounter (HOSPITAL_COMMUNITY): Payer: Self-pay

## 2022-08-17 ENCOUNTER — Inpatient Hospital Stay (HOSPITAL_COMMUNITY)
Admission: EM | Admit: 2022-08-17 | Discharge: 2022-08-19 | DRG: 399 | Disposition: A | Discharge status: Home / Self Care | Payer: Medicare PPO | Source: Ambulatory Visit | Attending: Internal Medicine | Admitting: Internal Medicine

## 2022-08-17 DIAGNOSIS — J449 Chronic obstructive pulmonary disease, unspecified: Secondary | ICD-10-CM | POA: Diagnosis not present

## 2022-08-17 DIAGNOSIS — T502X5A Adverse effect of carbonic-anhydrase inhibitors, benzothiadiazides and other diuretics, initial encounter: Secondary | ICD-10-CM | POA: Diagnosis present

## 2022-08-17 DIAGNOSIS — I1310 Hypertensive heart and chronic kidney disease without heart failure, with stage 1 through stage 4 chronic kidney disease, or unspecified chronic kidney disease: Secondary | ICD-10-CM | POA: Diagnosis not present

## 2022-08-17 DIAGNOSIS — I272 Pulmonary hypertension, unspecified: Secondary | ICD-10-CM | POA: Diagnosis present

## 2022-08-17 DIAGNOSIS — Z880 Allergy status to penicillin: Secondary | ICD-10-CM

## 2022-08-17 DIAGNOSIS — K769 Liver disease, unspecified: Secondary | ICD-10-CM | POA: Diagnosis not present

## 2022-08-17 DIAGNOSIS — R1031 Right lower quadrant pain: Secondary | ICD-10-CM | POA: Diagnosis not present

## 2022-08-17 DIAGNOSIS — Z7982 Long term (current) use of aspirin: Secondary | ICD-10-CM

## 2022-08-17 DIAGNOSIS — I739 Peripheral vascular disease, unspecified: Secondary | ICD-10-CM | POA: Diagnosis not present

## 2022-08-17 DIAGNOSIS — N1831 Chronic kidney disease, stage 3a: Secondary | ICD-10-CM | POA: Diagnosis present

## 2022-08-17 DIAGNOSIS — Z79899 Other long term (current) drug therapy: Secondary | ICD-10-CM

## 2022-08-17 DIAGNOSIS — I2729 Other secondary pulmonary hypertension: Secondary | ICD-10-CM | POA: Diagnosis present

## 2022-08-17 DIAGNOSIS — I10 Essential (primary) hypertension: Secondary | ICD-10-CM | POA: Diagnosis present

## 2022-08-17 DIAGNOSIS — K760 Fatty (change of) liver, not elsewhere classified: Secondary | ICD-10-CM | POA: Diagnosis not present

## 2022-08-17 DIAGNOSIS — D45 Polycythemia vera: Secondary | ICD-10-CM | POA: Diagnosis not present

## 2022-08-17 DIAGNOSIS — K3580 Unspecified acute appendicitis: Secondary | ICD-10-CM | POA: Diagnosis present

## 2022-08-17 DIAGNOSIS — E876 Hypokalemia: Secondary | ICD-10-CM | POA: Diagnosis present

## 2022-08-17 DIAGNOSIS — M8580 Other specified disorders of bone density and structure, unspecified site: Secondary | ICD-10-CM | POA: Diagnosis present

## 2022-08-17 DIAGNOSIS — I872 Venous insufficiency (chronic) (peripheral): Secondary | ICD-10-CM | POA: Diagnosis present

## 2022-08-17 DIAGNOSIS — E079 Disorder of thyroid, unspecified: Secondary | ICD-10-CM | POA: Diagnosis present

## 2022-08-17 DIAGNOSIS — R109 Unspecified abdominal pain: Secondary | ICD-10-CM | POA: Diagnosis present

## 2022-08-17 DIAGNOSIS — Z881 Allergy status to other antibiotic agents status: Secondary | ICD-10-CM

## 2022-08-17 DIAGNOSIS — I129 Hypertensive chronic kidney disease with stage 1 through stage 4 chronic kidney disease, or unspecified chronic kidney disease: Secondary | ICD-10-CM | POA: Diagnosis not present

## 2022-08-17 DIAGNOSIS — Z8249 Family history of ischemic heart disease and other diseases of the circulatory system: Secondary | ICD-10-CM | POA: Diagnosis not present

## 2022-08-17 DIAGNOSIS — E0590 Thyrotoxicosis, unspecified without thyrotoxic crisis or storm: Secondary | ICD-10-CM | POA: Diagnosis present

## 2022-08-17 DIAGNOSIS — K3533 Acute appendicitis with perforation and localized peritonitis, with abscess: Secondary | ICD-10-CM | POA: Diagnosis not present

## 2022-08-17 DIAGNOSIS — D72825 Bandemia: Secondary | ICD-10-CM | POA: Diagnosis not present

## 2022-08-17 LAB — CBC WITH DIFFERENTIAL/PLATELET
Abs Immature Granulocytes: 0.08 10*3/uL — ABNORMAL HIGH (ref 0.00–0.07)
Basophils Absolute: 0.1 10*3/uL (ref 0.0–0.1)
Basophils Relative: 1 %
Eosinophils Absolute: 0.2 10*3/uL (ref 0.0–0.5)
Eosinophils Relative: 1 %
HCT: 43 % (ref 36.0–46.0)
Hemoglobin: 14.2 g/dL (ref 12.0–15.0)
Immature Granulocytes: 1 %
Lymphocytes Relative: 9 %
Lymphs Abs: 1.3 10*3/uL (ref 0.7–4.0)
MCH: 38.2 pg — ABNORMAL HIGH (ref 26.0–34.0)
MCHC: 33 g/dL (ref 30.0–36.0)
MCV: 115.6 fL — ABNORMAL HIGH (ref 80.0–100.0)
Monocytes Absolute: 1.2 10*3/uL — ABNORMAL HIGH (ref 0.1–1.0)
Monocytes Relative: 9 %
Neutro Abs: 10.9 10*3/uL — ABNORMAL HIGH (ref 1.7–7.7)
Neutrophils Relative %: 79 %
Platelets: 448 10*3/uL — ABNORMAL HIGH (ref 150–400)
RBC: 3.72 MIL/uL — ABNORMAL LOW (ref 3.87–5.11)
RDW: 14.2 % (ref 11.5–15.5)
WBC: 13.6 10*3/uL — ABNORMAL HIGH (ref 4.0–10.5)
nRBC: 0 % (ref 0.0–0.2)

## 2022-08-17 LAB — COMPREHENSIVE METABOLIC PANEL
ALT: 23 U/L (ref 0–44)
AST: 28 U/L (ref 15–41)
Albumin: 3.8 g/dL (ref 3.5–5.0)
Alkaline Phosphatase: 80 U/L (ref 38–126)
Anion gap: 9 (ref 5–15)
BUN: 27 mg/dL — ABNORMAL HIGH (ref 8–23)
CO2: 30 mmol/L (ref 22–32)
Calcium: 9.1 mg/dL (ref 8.9–10.3)
Chloride: 99 mmol/L (ref 98–111)
Creatinine, Ser: 1.05 mg/dL — ABNORMAL HIGH (ref 0.44–1.00)
GFR, Estimated: 51 mL/min — ABNORMAL LOW (ref >60)
Glucose, Bld: 87 mg/dL (ref 70–99)
Potassium: 3.4 mmol/L — ABNORMAL LOW (ref 3.5–5.1)
Sodium: 138 mmol/L (ref 135–145)
Total Bilirubin: 1.1 mg/dL (ref 0.3–1.2)
Total Protein: 7.2 g/dL (ref 6.5–8.1)

## 2022-08-17 LAB — URINALYSIS, ROUTINE W REFLEX MICROSCOPIC
Bilirubin Urine: NEGATIVE
Glucose, UA: NEGATIVE mg/dL
Hgb urine dipstick: NEGATIVE
Ketones, ur: NEGATIVE mg/dL
Leukocytes,Ua: NEGATIVE
Nitrite: NEGATIVE
Protein, ur: NEGATIVE mg/dL
Specific Gravity, Urine: 1.008 (ref 1.005–1.030)
pH: 7 (ref 5.0–8.0)

## 2022-08-17 LAB — LIPASE, BLOOD: Lipase: 73 U/L — ABNORMAL HIGH (ref 11–51)

## 2022-08-17 LAB — MAGNESIUM: Magnesium: 2.4 mg/dL (ref 1.7–2.4)

## 2022-08-17 MED ORDER — METHIMAZOLE 5 MG PO TABS
5.0000 mg | ORAL_TABLET | Freq: Every day | ORAL | Status: DC
  Administered 2022-08-19: 5 mg via ORAL
  Filled 2022-08-17 (×2): qty 1

## 2022-08-17 MED ORDER — LORATADINE 10 MG PO TABS
10.0000 mg | ORAL_TABLET | Freq: Every day | ORAL | Status: DC
  Administered 2022-08-17 – 2022-08-19 (×2): 10 mg via ORAL
  Filled 2022-08-17 (×2): qty 1

## 2022-08-17 MED ORDER — OYSTER SHELL CALCIUM/D3 500-5 MG-MCG PO TABS
0.5000 | ORAL_TABLET | Freq: Two times a day (BID) | ORAL | Status: DC
  Administered 2022-08-17 – 2022-08-19 (×3): 0.5 via ORAL
  Filled 2022-08-17 (×3): qty 1

## 2022-08-17 MED ORDER — DILTIAZEM HCL ER 180 MG PO CP24
180.0000 mg | ORAL_CAPSULE | Freq: Every day | ORAL | Status: DC
  Administered 2022-08-18 – 2022-08-19 (×2): 180 mg via ORAL
  Filled 2022-08-17 (×4): qty 1

## 2022-08-17 MED ORDER — B COMPLEX VITAMINS PO CAPS: 1.0000 | ORAL_CAPSULE | Freq: Every day | ORAL | Status: DC

## 2022-08-17 MED ORDER — LOSARTAN POTASSIUM 50 MG PO TABS
100.0000 mg | ORAL_TABLET | Freq: Every day | ORAL | Status: DC
  Administered 2022-08-17 – 2022-08-18 (×2): 100 mg via ORAL
  Filled 2022-08-17: qty 2
  Filled 2022-08-17: qty 4

## 2022-08-17 MED ORDER — PSYLLIUM 95 % PO PACK
1.0000 | PACK | Freq: Every day | ORAL | Status: DC
  Administered 2022-08-17 – 2022-08-19 (×2): 1 via ORAL
  Filled 2022-08-17 (×2): qty 1

## 2022-08-17 MED ORDER — ACETAMINOPHEN 650 MG RE SUPP: 650.0000 mg | Freq: Four times a day (QID) | RECTAL | Status: DC | PRN

## 2022-08-17 MED ORDER — LIDOCAINE 5 % EX PTCH: 1.0000 | MEDICATED_PATCH | CUTANEOUS | Status: DC

## 2022-08-17 MED ORDER — CALCIUM CITRATE-VITAMIN D 250-100 MG-UNIT PO TABS: 1.0000 | ORAL_TABLET | Freq: Two times a day (BID) | ORAL | Status: DC

## 2022-08-17 MED ORDER — SODIUM CHLORIDE 0.9 % IV SOLN
2.0000 g | Freq: Once | INTRAVENOUS | Status: AC
  Administered 2022-08-17: 2 g via INTRAVENOUS
  Filled 2022-08-17: qty 20

## 2022-08-17 MED ORDER — HYDROXYUREA 500 MG PO CAPS: 500.0000 mg | ORAL_CAPSULE | Freq: Every day | ORAL | Status: DC

## 2022-08-17 MED ORDER — POTASSIUM CHLORIDE IN NACL 40-0.9 MEQ/L-% IV SOLN
INTRAVENOUS | Status: AC
  Filled 2022-08-17 (×2): qty 1000

## 2022-08-17 MED ORDER — SODIUM CHLORIDE 0.9 % IV SOLN: INTRAVENOUS | Status: AC

## 2022-08-17 MED ORDER — SODIUM CHLORIDE 0.9 % IV SOLN
2.0000 g | INTRAVENOUS | Status: DC
  Administered 2022-08-18: 2 g via INTRAVENOUS
  Filled 2022-08-17: qty 20

## 2022-08-17 MED ORDER — ORAL CARE MOUTH RINSE: 15.0000 mL | OROMUCOSAL | Status: DC | PRN

## 2022-08-17 MED ORDER — IOHEXOL 300 MG/ML  SOLN
100.0000 mL | Freq: Once | INTRAMUSCULAR | Status: AC | PRN
  Administered 2022-08-17: 100 mL via INTRAVENOUS

## 2022-08-17 MED ORDER — B COMPLEX-C PO TABS
1.0000 | ORAL_TABLET | Freq: Every day | ORAL | Status: DC
  Administered 2022-08-17 – 2022-08-19 (×2): 1 via ORAL
  Filled 2022-08-17 (×3): qty 1

## 2022-08-17 MED ORDER — METRONIDAZOLE 500 MG/100ML IV SOLN
500.0000 mg | Freq: Once | INTRAVENOUS | Status: AC
  Administered 2022-08-17: 500 mg via INTRAVENOUS
  Filled 2022-08-17: qty 100

## 2022-08-17 MED ORDER — SODIUM CHLORIDE 0.45 % IV SOLN
INTRAVENOUS | Status: DC
Start: 2022-08-17 — End: 2022-08-17

## 2022-08-17 MED ORDER — ONDANSETRON HCL 4 MG PO TABS: 4.0000 mg | ORAL_TABLET | Freq: Four times a day (QID) | ORAL | Status: DC | PRN

## 2022-08-17 MED ORDER — MORPHINE SULFATE (PF) 2 MG/ML IV SOLN: 2.0000 mg | INTRAVENOUS | Status: DC | PRN

## 2022-08-17 MED ORDER — ONDANSETRON HCL 4 MG/2ML IJ SOLN: 4.0000 mg | Freq: Four times a day (QID) | INTRAMUSCULAR | Status: DC | PRN

## 2022-08-17 MED ORDER — TORSEMIDE 20 MG PO TABS
20.0000 mg | ORAL_TABLET | Freq: Two times a day (BID) | ORAL | Status: DC
  Administered 2022-08-17 – 2022-08-19 (×3): 20 mg via ORAL
  Filled 2022-08-17 (×4): qty 1

## 2022-08-17 MED ORDER — POTASSIUM CHLORIDE CRYS ER 20 MEQ PO TBCR
20.0000 meq | EXTENDED_RELEASE_TABLET | Freq: Two times a day (BID) | ORAL | Status: DC
  Administered 2022-08-17 – 2022-08-18 (×2): 20 meq via ORAL
  Filled 2022-08-17 (×2): qty 1

## 2022-08-17 MED ORDER — METRONIDAZOLE 500 MG/100ML IV SOLN
500.0000 mg | Freq: Two times a day (BID) | INTRAVENOUS | Status: DC
  Administered 2022-08-18 – 2022-08-19 (×3): 500 mg via INTRAVENOUS
  Filled 2022-08-17 (×3): qty 100

## 2022-08-17 MED ORDER — TIMOLOL MALEATE 0.5 % OP SOLN
1.0000 [drp] | Freq: Every day | OPHTHALMIC | Status: DC
  Administered 2022-08-19: 1 [drp] via OPHTHALMIC
  Filled 2022-08-17: qty 5

## 2022-08-17 MED ORDER — ACETAMINOPHEN 325 MG PO TABS: 650.0000 mg | ORAL_TABLET | Freq: Four times a day (QID) | ORAL | Status: DC | PRN

## 2022-08-17 MED ORDER — KETOROLAC TROMETHAMINE 15 MG/ML IJ SOLN: 15.0000 mg | Freq: Once | INTRAMUSCULAR | Status: DC

## 2022-08-17 NOTE — ED Provider Notes (Signed)
EMERGENCY DEPARTMENT AT [facility] Provider Note   CSN: [id number] Arrival date & time: 08/17/22  1142     History  Chief Complaint  Patient presents with   Abdominal Pain    [name] is a 89 y.o. female.   Abdominal Pain    Patient is an 89 year old female with medical history of hypertension, palpitations, PAD, pulmonary hypertension presents to the emergency department due to right lower quadrant abdominal pain.  Symptom started initially 2 days ago and the pain was periumbilical, it was most severe when it for started after that the severity has been waxing waning with the pain localized to the right lower quadrant.  there constantly but it is worse when she lays on her left side she has a pulling pain, worse with any palpation to the area.  She denies any nausea or vomiting, no dysuria or hematuria.  She has never had abdominal surgeries, seen by primary send to ED for appendicitis rule out.  Home Medications Prior to Admission medications   Medication Sig Start Date End Date Taking? Authorizing Provider  aspirin 81 MG tablet Take 81 mg by mouth daily.    [provider]  b complex vitamins capsule Take 1 capsule by mouth daily.    [provider]  calcium-vitamin D 250-100 MG-UNIT per tablet Take 1 tablet by mouth 2 (two) times daily.    [provider]  cetirizine (ZYRTEC) 10 MG tablet Take 10 mg by mouth daily.    [provider]  diltiazem (DILACOR XR) 180 MG 24 hr capsule Take 180 mg by mouth daily.    [provider]  hydroxyurea (HYDREA) 500 MG capsule Take 1 capsule (500 mg total) by mouth daily. May take with food to minimize GI side effects. 07/22/22   Johney Maine, MD  losartan (COZAAR) 100 MG tablet TAKE 1 TABLET BY MOUTH DAILY 07/24/22   Patwardhan, Anabel Bene, MD  methimazole (TAPAZOLE) 5 MG tablet Take 5 mg by mouth daily.    [provider]  potassium chloride SA (KLOR-CON M)  20 MEQ tablet TAKE 1 TABLET BY MOUTH TWICE DAILY 01/29/22   Patwardhan, Manish J, MD  psyllium (METAMUCIL) 58.6 % packet Take 1 packet by mouth daily.    [provider]  timolol (TIMOPTIC) 0.5 % ophthalmic solution Place 1 drop into both eyes daily.    [provider]  torsemide (DEMADEX) 20 MG tablet Take 1 tablet (20 mg total) by mouth 2 (two) times daily. 07/28/22 07/23/23  Patwardhan, Anabel Bene, MD      Allergies    Doxycycline calcium and Penicillin v    Review of Systems   Review of Systems  Gastrointestinal:  Positive for abdominal pain.    Physical Exam Updated Vital Signs BP (!) 167/61   Pulse 72   Temp 98 F (36.7 C) (Oral)   Resp 18   SpO2 97%  Physical Exam Vitals and nursing note reviewed. Exam conducted with a chaperone present.  Constitutional:      Appearance: Normal appearance.  HENT:     Head: Normocephalic and atraumatic.  Eyes:     General: No scleral icterus.       Right eye: No discharge.        Left eye: No discharge.     Extraocular Movements: Extraocular movements intact.     Pupils: Pupils are equal, round, and reactive to light.  Cardiovascular:     Rate and Rhythm: Normal  rate and regular rhythm.     Pulses: Normal pulses.     Heart sounds: Normal heart sounds. No murmur heard.    No friction rub. No gallop.  Pulmonary:     Effort: Pulmonary effort is normal. No respiratory distress.     Breath sounds: Normal breath sounds.  Abdominal:     General: Abdomen is flat. Bowel sounds are normal. There is no distension.     Palpations: Abdomen is soft.     Tenderness: There is abdominal tenderness in the right lower quadrant. Positive signs include Rovsing's sign and McBurney's sign.  Skin:    General: Skin is warm and dry.     Coloration: Skin is not jaundiced.  Neurological:     Mental Status: She is alert. Mental status is at baseline.     Coordination: Coordination normal.     ED Results / Procedures / Treatments    Labs (all labs ordered are listed, but only abnormal results are displayed) Labs Reviewed  CBC WITH DIFFERENTIAL/PLATELET - Abnormal; Notable for the following components:      Result Value   WBC 13.6 (*)    RBC 3.72 (*)    MCV 115.6 (*)    MCH 38.2 (*)    Platelets 448 (*)    Neutro Abs 10.9 (*)    Monocytes Absolute 1.2 (*)    Abs Immature Granulocytes 0.08 (*)    All other components within normal limits  COMPREHENSIVE METABOLIC PANEL - Abnormal; Notable for the following components:   Potassium 3.4 (*)    BUN 27 (*)    Creatinine, Ser 1.05 (*)    GFR, Estimated 51 (*)    All other components within normal limits  LIPASE, BLOOD - Abnormal; Notable for the following components:   Lipase 73 (*)    All other components within normal limits  URINALYSIS, ROUTINE W REFLEX MICROSCOPIC - Abnormal; Notable for the following components:   Color, Urine STRAW (*)    All other components within normal limits  MAGNESIUM    EKG None  Radiology CT ABDOMEN PELVIS W CONTRAST  Result Date: 08/17/2022 CLINICAL DATA:  Right lower quadrant abdominal pain. Elevated white blood cell count EXAM: CT ABDOMEN AND PELVIS WITH CONTRAST TECHNIQUE: Multidetector CT imaging of the abdomen and pelvis was performed using the standard protocol following bolus administration of intravenous contrast. RADIATION DOSE REDUCTION: This exam was performed according to the departmental dose-optimization program which includes automated exposure control, adjustment of the mA and/or kV according to patient size and/or use of iterative reconstruction technique. CONTRAST:  OMNIPAQUE IOHEXOL 300 MG/ML  SOLN COMPARISON:  None FINDINGS: Lower chest: Heart is enlarged. Small pericardial effusion. Trace left-sided pleural effusion. There are some reticular changes of the bases with areas of bronchiectasis. Hepatobiliary: Mild fatty liver infiltration. Slight asymmetric enlargement of the left hepatic lobe with slightly  nodular contours. Please correlate for any evidence of underlying chronic liver disease. Note is made of a distended IVC. No space-occupying liver lesion. Patent portal vein. Pancreas: Mild pancreatic atrophy with diffuse mildly enlarged pancreatic duct. No obvious mass initially there was a filling defect within the splenic vein with this does not persist on delayed imaging and may have been flow related. Spleen: Spleen has a cephalocaudal length of 11.1 cm. Nonenlarged. Preserved enhancement. Adrenals/Urinary Tract: Adrenal glands are preserved. Right kidney is slightly malrotated. No enhancing renal mass or collecting system dilatation. Extrarenal pelvis noted to each kidney. Preserved contours of the urinary bladder. Stomach/Bowel:  Stomach is nondilated. On this non oral contrast exam the small bowel is nondilated. Large bowel has some scattered stool. No bowel obstruction. Redundant loops identified. There is some stranding identified in the area of the ileocecal valve. Small area of fluid and thickening seen lateral to the cecum in the anterior right hemipelvis. This appears separate from the ileocecal valve on coronal imaging. This area measures proximally 2.8 by 2.1 by 3.2 cm. This is in the expected location of the appendix although the appendix itself is poorly defined on this examination. With patient's clinical history this very well could be small abscess formation related to appendicitis. There is in principle differential based on clinical presentation and appearance. Vascular/Lymphatic: Normal caliber aorta and IVC. Scattered vascular calcifications. No specific abnormal lymph node enlargement. Reproductive: Uterus and bilateral adnexa are unremarkable. Other: Anasarca. No free air. Mild mesenteric haziness. Trace perihepatic ascites. Musculoskeletal: Curvature of the spine. Scattered degenerative changes seen of the spine and pelvis. Trace retrolisthesis with stenosis at L2-3. Critical  Value/emergent results were called by telephone at the time of interpretation on 08/17/2022 at 11:21 am to provider Azlee Monforte , who verbally acknowledged these results. IMPRESSION: 3.2 cm complex fluid and soft tissue area adjacent to the base of the cecum laterally in the right hemipelvis. This has separate from the ileocecal valve and the appendix is not seen as a separate structure. In light of the patient's history and appearance this very well could be sequela appendicitis. Possible small abscess. Enlarged heart with dilated IVC. Changes of chronic liver disease with fatty infiltration nodular contour. There is also hypertrophy of segment 3 of the liver. Please correlate with the history. Trace ascites. Subtle redundant loops of bowel with scattered stool. No obstruction. Tiny left pleural effusion Electronically Signed   By: Karen Kays M.D.   On: 08/17/2022 14:34    Procedures Procedures    Medications Ordered in ED Medications  cefTRIAXone (ROCEPHIN) 2 g in sodium chloride 0.9 % 100 mL IVPB (2 g Intravenous New Bag/Given 08/17/22 1544)    And  metroNIDAZOLE (FLAGYL) IVPB 500 mg (has no administration in time range)  hydroxyurea (HYDREA) capsule 500 mg (has no administration in time range)  diltiazem (DILACOR XR) 24 hr capsule 180 mg (has no administration in time range)  losartan (COZAAR) tablet 100 mg (has no administration in time range)  torsemide (DEMADEX) tablet 20 mg (has no administration in time range)  methimazole (TAPAZOLE) tablet 5 mg (has no administration in time range)  psyllium (HYDROCIL/METAMUCIL) 1 packet (has no administration in time range)  b complex vitamins capsule 1 capsule (has no administration in time range)  calcium-vitamin D 250-100 MG-UNIT per tablet 1 tablet (has no administration in time range)  potassium chloride SA (KLOR-CON M) CR tablet 20 mEq (has no administration in time range)  loratadine (CLARITIN) tablet 10 mg (has no administration in time range)   timolol (TIMOPTIC) 0.5 % ophthalmic solution 1 drop (has no administration in time range)  acetaminophen (TYLENOL) tablet 650 mg (has no administration in time range)    Or  acetaminophen (TYLENOL) suppository 650 mg (has no administration in time range)  morphine (PF) 2 MG/ML injection 2 mg (has no administration in time range)  ondansetron (ZOFRAN) tablet 4 mg (has no administration in time range)    Or  ondansetron (ZOFRAN) injection 4 mg (has no administration in time range)  0.9 % NaCl with KCl 40 mEq / L  infusion (has no administration in time range)  iohexol (OMNIPAQUE) 300 MG/ML solution 100 mL (100 mLs Intravenous Contrast Given 08/17/22 1402)    ED Course/ Medical Decision Making/ A&P Clinical Course as of 08/17/22 1618  Mon Aug 17, 2022  1224 Patient had lab work performed prior to arrival at ED.  Her creatinine was 1.3 earlier today, I called CT to let them know she can go back for CT imaging once IV is then. [HS]  1445 Patient has appendicitis on the CT scan, I called [facility] who will come evaluate the patient.  Patient updated on results. [HS]  1512 Spoke with Dr. Ricky Stabs, given patient's cardiomegaly and cardiac history would require cardiac clearance before surgical intervention.  Surgery will evaluate the patient but requesting we consult with cardiology and admit to medicine for IV antibiotics. [HS]  1518 I consulted with Dr. Jacinto Halim on-call with cardiology, they will consult on patient  [HS]    Clinical Course User Index [HS] Theron Arista, PA-C                             Medical Decision Making Amount and/or Complexity of Data Reviewed Labs: ordered. Radiology: ordered.  Risk Prescription drug management. Decision regarding hospitalization.   89 year old female with medical history notable for severe pulmonary hypertension presents to the emergency department due to right lower quadrant abdominal pain.  Differential includes not limited to appendicitis,  colitis, UTI, pyelonephritis, dehydration, AKI, electrolyte derangement, atypical diverticulitis.  History is provided the patient, laboratory workup obtained by primary prior to evaluation today and external medical record review including cardiology notes.  Laboratory workup notable for leukocytosis of 13.6 with left shift, no gross electrolyte derangement or AKI.  Lipase very mildly elevated at 73.  No UTI.  CT abdomen shows appendicitis and likely small abscess.  I consulted general surgery, cardiology as documented in the ED course. I ordered Rocephin and Flagyl.  Patient declined pain medicine at this time.  I will consult hospitalist for admission for IV antibiotics pending cardiology evaluation and decision from the surgery team regarding drain versus appendectomy.        Final Clinical Impression(s) / ED Diagnoses Final diagnoses:  Acute appendicitis, unspecified acute appendicitis type    Rx / DC Orders ED Discharge Orders     None         Theron Arista, PA-C 08/17/22 1618    Benjiman Core, MD 08/17/22 [phone]

## 2022-08-17 NOTE — Progress Notes (Signed)
Prior-To-Admission Oral Chemotherapy for Treatment of Oncologic Disease   Order noted from Dr. Joylene Igo to continue prior-to-admission oral chemotherapy regimen of Hydrea.  Procedure Per Pharmacy & Therapeutics Committee Policy: Orders for continuation of home oral chemotherapy for treatment of an oncologic disease will be held unless approved by an oncologist during current admission.    For patients receiving oncology care at [facility] inpatient pharmacist contacts patient's oncologist during regular office hours to review. If earlier review is medically necessary, attending physician consults [facility] on-call oncologist  Noted patient being treated for appendicitis (on ceftriaxone and metronidazole)  Oral chemotherapy continuation order is on hold pending oncologist review, [facility] oncologist Dr Candise Che will be notified by inpatient pharmacy during office hours    Kden Wagster, Joselyn Glassman, PharmD 08/17/2022, 7:09 PM

## 2022-08-17 NOTE — Assessment & Plan Note (Signed)
Continue losartan and diltiazem ?

## 2022-08-17 NOTE — Progress Notes (Signed)
Known pulmonary hypertension, clinically stable. Acceptable perioperative cardiac risk for acute surgery.   Elder Negus, MD Pager: [phone] Office: [phone]

## 2022-08-17 NOTE — Assessment & Plan Note (Addendum)
Patient with a known history of pulmonary hypertension Continue torsemide and losartan

## 2022-08-17 NOTE — ED Triage Notes (Signed)
Pt c/o RLQ abd pain since Saturday that has turned into pressure. Pt went to PCP office and was directed to ED to r/o appendicitis. Pt denies N/V/D.

## 2022-08-17 NOTE — Progress Notes (Addendum)
Patient arrived to 1318 in NAD, VS stable. Patient free from pain. Patient oriented to room and call bell in reach. Husband at bedside. He will take the patient's purse home with him. Patient has shoes and clothes at bedside. Patient also has bilateral hearing aids in place and glasses.

## 2022-08-17 NOTE — Assessment & Plan Note (Signed)
Stable ?Continue methimazole ?

## 2022-08-17 NOTE — Consult Note (Signed)
[name] Darnelle Bos [birth year]  [id number].    Requesting MD: Dr. Benjiman Core Chief Complaint/Reason for Consult: possible appendicitis with abscess  HPI:  This is a pleasant 89 yo female with a history of pulmonary HTN, polycythemia vera, hyperthyroidism, HTN, and PAD who ate supper Saturday night and then around 2030 developed RLQ abdominal pain.  It actually improved some on Sunday and was just sore.  She denies any fevers, chills, SOB, CP, N/V/D, etc.  She last had a colonoscopy in 2012 and the scope was unable to pass completely due to tortuosity so she underwent a barium enema that was reportedly normal.  She has regular BMs with no blood present.  Today she continued to have some soreness in her RLQ and thought she may have appendicitis so she presented to the [facility] for evaluation. She has a WBC of 13.6, hgb 14.2, normal cr, and a CT scan that revealed a 3.2cm complex fluid and soft tissue area adjacent to the base of the cecum which is separate from the ICV and the appendix is not seen.  This could be secondary to appendicitis with a possible small abscess.  She is also noted to have an enlarged heart with a dilated IVC and chronic liver disease with a nodular contour, trace ascites.  We have been asked to see the patient for further evaluation.  ROS: ROS see HPI  Family History  Problem Relation Age of Onset   Heart disease Father    Cancer Son    Cancer Maternal Aunt    Cancer Paternal Uncle    Cancer Maternal Grandfather    Breast cancer Neg Hx     Past Medical History:  Diagnosis Date   Osteopenia    Thyroid disease    Varicose veins VENOUS INSUFFICIENCY SLOW HEALING OF WOUNDS [name] TO LOWER EXTEMETIES   Venous stasis dermatitis     Past Surgical History:  Procedure Laterality Date   BREAST CYST ASPIRATION Left    EYE SURGERY     TONSILLECTOMY      Social History:  reports that she has never smoked. She has never used smokeless tobacco. She reports that she  does not drink alcohol and does not use drugs.  Allergies:  Allergies  Allergen Reactions   Doxycycline Calcium Other (See Comments)    Per patient "Intestines do not tolerate very well"    Penicillin V Other (See Comments)    (Not in a hospital admission)    Physical Exam: Blood pressure (!) 166/57, pulse 68, temperature (!) 97.5 F (36.4 C), temperature source Oral, resp. rate 18, SpO2 97 %. General: pleasant, WD, WN white female who is laying in bed in NAD HEENT: head is normocephalic, atraumatic.  Sclera are noninjected.  PERRL.  Ears and nose without any masses or lesions.  Mouth is pink and moist Heart: regular, rate, and rhythm.  Palpable radial and pedal pulses bilaterally Lungs: CTAB, Respiratory effort nonlabored Abd: soft, tender in RLQ, with no guarding, rebounding, or peritoneal signs, mild lower abdominal bloating, +BS, no masses, hernias, or organomegaly MS: all 4 extremities are symmetrical with no cyanosis, clubbing. +1 BLE edema Psych: A&Ox3 with an appropriate affect.   Results for orders placed or performed during the hospital encounter of 08/17/22 (from the past 48 hour(s))  CBC with Differential     Status: Abnormal   Collection Time: 08/17/22 12:30 PM  Result Value Ref Range   WBC 13.6 (H) 4.0 - 10.5 K/uL   RBC  3.72 (L) 3.87 - 5.11 MIL/uL   Hemoglobin 14.2 12.0 - 15.0 g/dL   HCT 16.1 09.6 - 04.5 %   MCV 115.6 (H) 80.0 - 100.0 fL   MCH 38.2 (H) 26.0 - 34.0 pg   MCHC 33.0 30.0 - 36.0 g/dL   RDW 40.9 81.1 - 91.4 %   Platelets 448 (H) 150 - 400 K/uL   nRBC 0.0 0.0 - 0.2 %   Neutrophils Relative % 79 %   Neutro Abs 10.9 (H) 1.7 - 7.7 K/uL   Lymphocytes Relative 9 %   Lymphs Abs 1.3 0.7 - 4.0 K/uL   Monocytes Relative 9 %   Monocytes Absolute 1.2 (H) 0.1 - 1.0 K/uL   Eosinophils Relative 1 %   Eosinophils Absolute 0.2 0.0 - 0.5 K/uL   Basophils Relative 1 %   Basophils Absolute 0.1 0.0 - 0.1 K/uL   Immature Granulocytes 1 %   Abs Immature  Granulocytes 0.08 (H) 0.00 - 0.07 K/uL    Comment: Performed at [facility], [address], [place], [place] [address]  Comprehensive metabolic panel     Status: Abnormal   Collection Time: 08/17/22 12:30 PM  Result Value Ref Range   Sodium 138 135 - 145 mmol/L   Potassium 3.4 (L) 3.5 - 5.1 mmol/L   Chloride 99 98 - 111 mmol/L   CO2 30 22 - 32 mmol/L   Glucose, Bld 87 70 - 99 mg/dL    Comment: Glucose reference range applies only to samples taken after fasting for at least 8 hours.   BUN 27 (H) 8 - 23 mg/dL   Creatinine, Ser 6.21 (H) 0.44 - 1.00 mg/dL   Calcium 9.1 8.9 - 30.8 mg/dL   Total Protein 7.2 6.5 - 8.1 g/dL   Albumin 3.8 3.5 - 5.0 g/dL   AST 28 15 - 41 U/L   ALT 23 0 - 44 U/L   Alkaline Phosphatase 80 38 - 126 U/L   Total Bilirubin 1.1 0.3 - 1.2 mg/dL   GFR, Estimated 51 (L) >60 mL/min    Comment: (NOTE) Calculated using the CKD-EPI Creatinine Equation (2021)    Anion gap 9 5 - 15    Comment: Performed at [facility], [address] [place], [place] [address]  Lipase, blood     Status: Abnormal   Collection Time: 08/17/22 12:30 PM  Result Value Ref Range   Lipase 73 (H) 11 - 51 U/L    Comment: Performed at [facility], [address], [place], [place] [address]   CT ABDOMEN PELVIS W CONTRAST  Result Date: 08/17/2022 CLINICAL DATA:  Right lower quadrant abdominal pain. Elevated white blood cell count EXAM: CT ABDOMEN AND PELVIS WITH CONTRAST TECHNIQUE: Multidetector CT imaging of the abdomen and pelvis was performed using the standard protocol following bolus administration of intravenous contrast. RADIATION DOSE REDUCTION: This exam was performed according to the departmental dose-optimization program which includes automated exposure control, adjustment of the mA and/or kV according to patient size and/or use of iterative reconstruction technique. CONTRAST:  OMNIPAQUE IOHEXOL 300 MG/ML  SOLN COMPARISON:   None FINDINGS: Lower chest: Heart is enlarged. Small pericardial effusion. Trace left-sided pleural effusion. There are some reticular changes of the bases with areas of bronchiectasis. Hepatobiliary: Mild fatty liver infiltration. Slight asymmetric enlargement of the left hepatic lobe with slightly nodular contours. Please correlate for any evidence of underlying chronic liver disease. Note is made of a distended IVC. No space-occupying  liver lesion. Patent portal vein. Pancreas: Mild pancreatic atrophy with diffuse mildly enlarged pancreatic duct. No obvious mass initially there was a filling defect within the splenic vein with this does not persist on delayed imaging and may have been flow related. Spleen: Spleen has a cephalocaudal length of 11.1 cm. Nonenlarged. Preserved enhancement. Adrenals/Urinary Tract: Adrenal glands are preserved. Right kidney is slightly malrotated. No enhancing renal mass or collecting system dilatation. Extrarenal pelvis noted to each kidney. Preserved contours of the urinary bladder. Stomach/Bowel: Stomach is nondilated. On this non oral contrast exam the small bowel is nondilated. Large bowel has some scattered stool. No bowel obstruction. Redundant loops identified. There is some stranding identified in the area of the ileocecal valve. Small area of fluid and thickening seen lateral to the cecum in the anterior right hemipelvis. This appears separate from the ileocecal valve on coronal imaging. This area measures proximally 2.8 by 2.1 by 3.2 cm. This is in the expected location of the appendix although the appendix itself is poorly defined on this examination. With patient's clinical history this very well could be small abscess formation related to appendicitis. There is in principle differential based on clinical presentation and appearance. Vascular/Lymphatic: Normal caliber aorta and IVC. Scattered vascular calcifications. No specific abnormal lymph node enlargement.  Reproductive: Uterus and bilateral adnexa are unremarkable. Other: Anasarca. No free air. Mild mesenteric haziness. Trace perihepatic ascites. Musculoskeletal: Curvature of the spine. Scattered degenerative changes seen of the spine and pelvis. Trace retrolisthesis with stenosis at L2-3. Critical Value/emergent results were called by telephone at the time of interpretation on 08/17/2022 at 11:21 am to provider HALEY SAGE , who verbally acknowledged these results. IMPRESSION: 3.2 cm complex fluid and soft tissue area adjacent to the base of the cecum laterally in the right hemipelvis. This has separate from the ileocecal valve and the appendix is not seen as a separate structure. In light of the patient's history and appearance this very well could be sequela appendicitis. Possible small abscess. Enlarged heart with dilated IVC. Changes of chronic liver disease with fatty infiltration nodular contour. There is also hypertrophy of segment 3 of the liver. Please correlate with the history. Trace ascites. Subtle redundant loops of bowel with scattered stool. No obstruction. Tiny left pleural effusion Electronically Signed   By: Karen Kays M.D.   On: 08/17/2022 14:34      Assessment/Plan Abdominal pain with inflammatory process and small abscess in RLQ, possible appendicitis The patient has been seen, examined, chart, labs, vitals, and imaging personally reviewed.  She does appear to have inflammatory changes about the cecum in the RLQ with a complex fluid collection noted.  The appendix is not visualized and therefore appendicitis is unable to be ruled out.  Her symptoms started later Saturday evening.  This is quickly to have a perforated appendicitis with a phlegmon/abscess already, but not impossible.  For now, given her pulmonary HTN and other comorbidities, we recommend medical admission and cardiology consult to determine if she were to need an operation, what her perioperative risk stratification is.I have  tried to contact IR, but they are likely in a case, to discuss if this area is drainable or not.  It appears more phlegmonous instead of all fluid, but will get their final read as able.  For now, conservative management is recommended with possible CLD and abx therapy.  She is not ill appearing and does not have a concerning abdominal exam.  This was discussed with the patient and her husband, and  they are both in agreement with this plan.  We will continue to closely follow and monitor the patient.   FEN - NPO/IVFs per medicine VTE - ok for chemical prophylaxis from our standpoint ID - Rocephin/Flagyl  Pulmonary HTN HTN Polycythemia vera PAD hyperthyroidism  I reviewed nursing notes, ED provider notes, last 24 h vitals and pain scores, last 48 h intake and output, last 24 h labs and trends, and last 24 h imaging results.  Discussed with primary medical service at the bedside  Letha Cape, [facility] 08/17/2022, 2:59 PM Please see Amion for pager number during day hours 7:00am-4:30pm or 7:00am -11:30am on weekends

## 2022-08-17 NOTE — Assessment & Plan Note (Addendum)
Patient presents to the ER for evaluation of abdominal pain which initially started in the periumbilical area but is now localized to the right lower quadrant. CT scan of abdomen and pelvis shows 3.2 cm complex fluid and soft tissue area adjacent to the base of the cecum laterally in the right hemipelvis. This has separate from the ileocecal valve and the appendix is not seen as a separate structure. In light of the patient's history and appearance this very well could be sequela appendicitis. Possible small abscess. Consult surgery Cardiology consult for preoperative evaluation Place patient empirically on antibiotic therapy with Rocephin and Flagyl Keep patient n.p.o. for now

## 2022-08-17 NOTE — Assessment & Plan Note (Signed)
Secondary to diuretic therapy  Supplement potassium 

## 2022-08-17 NOTE — Assessment & Plan Note (Signed)
Continue Hydrea.  

## 2022-08-17 NOTE — ED Notes (Signed)
ED TO INPATIENT HANDOFF REPORT  Name/Age/Gender [name] 89 y.o. female  Code Status    Code Status Orders  (From admission, onward)           Start     Ordered   08/17/22 1606  Full code  Continuous       Question:  By:  Answer:  Consent: discussion documented in EHR   08/17/22 1608           Code Status History     This patient has a current code status but no historical code status.       Home/SNF/Other Home  Chief Complaint Acute appendicitis [K35.80]  Level of Care/Admitting Diagnosis ED Disposition     ED Disposition  Admit   Condition  --   Comment  Hospital Area: [facility] [[id number]]  Level of Care: Med-Surg [16]  May admit patient to [facility] or [facility] if equivalent level of care is available:: Yes  Covid Evaluation: Asymptomatic - no recent exposure (last 10 days) testing not required  Diagnosis: Acute appendicitis [161096]  Admitting Physician: Lonia Mad  Attending Physician: Lonia Mad  Certification:: I certify this patient will need inpatient services for at least 2 midnights  Estimated Length of Stay: 2          Medical History Past Medical History:  Diagnosis Date   Osteopenia    Thyroid disease    Varicose veins VENOUS INSUFFICIENCY SLOW HEALING OF WOUNDS [name] TO LOWER EXTEMETIES   Venous stasis dermatitis     Allergies Allergies  Allergen Reactions   Doxycycline Calcium Other (See Comments)    Per patient "Intestines do not tolerate very well"    Penicillin V Other (See Comments)    IV Location/Drains/Wounds Patient Lines/Drains/Airways Status     Active Line/Drains/Airways     Name Placement date Placement time Site Days   Peripheral IV 08/17/22 20 G 1" Anterior;Proximal;Right Forearm 08/17/22  1227  Forearm  less than 1            Labs/Imaging Results for orders placed or performed during the hospital encounter of 08/17/22 (from the  past 48 hour(s))  CBC with Differential     Status: Abnormal   Collection Time: 08/17/22 12:30 PM  Result Value Ref Range   WBC 13.6 (H) 4.0 - 10.5 K/uL   RBC 3.72 (L) 3.87 - 5.11 MIL/uL   Hemoglobin 14.2 12.0 - 15.0 g/dL   HCT 04.5 40.9 - 81.1 %   MCV 115.6 (H) 80.0 - 100.0 fL   MCH 38.2 (H) 26.0 - 34.0 pg   MCHC 33.0 30.0 - 36.0 g/dL   RDW 91.4 78.2 - 95.6 %   Platelets 448 (H) 150 - 400 K/uL   nRBC 0.0 0.0 - 0.2 %   Neutrophils Relative % 79 %   Neutro Abs 10.9 (H) 1.7 - 7.7 K/uL   Lymphocytes Relative 9 %   Lymphs Abs 1.3 0.7 - 4.0 K/uL   Monocytes Relative 9 %   Monocytes Absolute 1.2 (H) 0.1 - 1.0 K/uL   Eosinophils Relative 1 %   Eosinophils Absolute 0.2 0.0 - 0.5 K/uL   Basophils Relative 1 %   Basophils Absolute 0.1 0.0 - 0.1 K/uL   Immature Granulocytes 1 %   Abs Immature Granulocytes 0.08 (H) 0.00 - 0.07 K/uL    Comment: Performed at [facility], [address], [place], [place] [address]  Comprehensive metabolic panel  Status: Abnormal   Collection Time: 08/17/22 12:30 PM  Result Value Ref Range   Sodium 138 135 - 145 mmol/L   Potassium 3.4 (L) 3.5 - 5.1 mmol/L   Chloride 99 98 - 111 mmol/L   CO2 30 22 - 32 mmol/L   Glucose, Bld 87 70 - 99 mg/dL    Comment: Glucose reference range applies only to samples taken after fasting for at least 8 hours.   BUN 27 (H) 8 - 23 mg/dL   Creatinine, Ser 5.63 (H) 0.44 - 1.00 mg/dL   Calcium 9.1 8.9 - 87.5 mg/dL   Total Protein 7.2 6.5 - 8.1 g/dL   Albumin 3.8 3.5 - 5.0 g/dL   AST 28 15 - 41 U/L   ALT 23 0 - 44 U/L   Alkaline Phosphatase 80 38 - 126 U/L   Total Bilirubin 1.1 0.3 - 1.2 mg/dL   GFR, Estimated 51 (L) >60 mL/min    Comment: (NOTE) Calculated using the CKD-EPI Creatinine Equation (2021)    Anion gap 9 5 - 15    Comment: Performed at [facility], [address] [place], [place] [address]  Lipase, blood     Status: Abnormal   Collection Time: 08/17/22 12:30 PM   Result Value Ref Range   Lipase 73 (H) 11 - 51 U/L    Comment: Performed at [facility], [address], [place], [place] [address]  Urinalysis, Routine w reflex microscopic -Urine, Clean Catch     Status: Abnormal   Collection Time: 08/17/22  1:20 PM  Result Value Ref Range   Color, Urine STRAW (A) YELLOW   APPearance CLEAR CLEAR   Specific Gravity, Urine 1.008 1.005 - 1.030   pH 7.0 5.0 - 8.0   Glucose, UA NEGATIVE NEGATIVE mg/dL   Hgb urine dipstick NEGATIVE NEGATIVE   Bilirubin Urine NEGATIVE NEGATIVE   Ketones, ur NEGATIVE NEGATIVE mg/dL   Protein, ur NEGATIVE NEGATIVE mg/dL   Nitrite NEGATIVE NEGATIVE   Leukocytes,Ua NEGATIVE NEGATIVE    Comment: Performed at [facility], [address] [place], [place] [address]   CT ABDOMEN PELVIS W CONTRAST  Result Date: 08/17/2022 CLINICAL DATA:  Right lower quadrant abdominal pain. Elevated white blood cell count EXAM: CT ABDOMEN AND PELVIS WITH CONTRAST TECHNIQUE: Multidetector CT imaging of the abdomen and pelvis was performed using the standard protocol following bolus administration of intravenous contrast. RADIATION DOSE REDUCTION: This exam was performed according to the departmental dose-optimization program which includes automated exposure control, adjustment of the mA and/or kV according to patient size and/or use of iterative reconstruction technique. CONTRAST:  OMNIPAQUE IOHEXOL 300 MG/ML  SOLN COMPARISON:  None FINDINGS: Lower chest: Heart is enlarged. Small pericardial effusion. Trace left-sided pleural effusion. There are some reticular changes of the bases with areas of bronchiectasis. Hepatobiliary: Mild fatty liver infiltration. Slight asymmetric enlargement of the left hepatic lobe with slightly nodular contours. Please correlate for any evidence of underlying chronic liver disease. Note is made of a distended IVC. No space-occupying liver lesion. Patent portal vein. Pancreas: Mild  pancreatic atrophy with diffuse mildly enlarged pancreatic duct. No obvious mass initially there was a filling defect within the splenic vein with this does not persist on delayed imaging and may have been flow related. Spleen: Spleen has a cephalocaudal length of 11.1 cm. Nonenlarged. Preserved enhancement. Adrenals/Urinary Tract: Adrenal glands are preserved. Right kidney is slightly malrotated. No enhancing renal mass or collecting system dilatation. Extrarenal pelvis noted  to each kidney. Preserved contours of the urinary bladder. Stomach/Bowel: Stomach is nondilated. On this non oral contrast exam the small bowel is nondilated. Large bowel has some scattered stool. No bowel obstruction. Redundant loops identified. There is some stranding identified in the area of the ileocecal valve. Small area of fluid and thickening seen lateral to the cecum in the anterior right hemipelvis. This appears separate from the ileocecal valve on coronal imaging. This area measures proximally 2.8 by 2.1 by 3.2 cm. This is in the expected location of the appendix although the appendix itself is poorly defined on this examination. With patient's clinical history this very well could be small abscess formation related to appendicitis. There is in principle differential based on clinical presentation and appearance. Vascular/Lymphatic: Normal caliber aorta and IVC. Scattered vascular calcifications. No specific abnormal lymph node enlargement. Reproductive: Uterus and bilateral adnexa are unremarkable. Other: Anasarca. No free air. Mild mesenteric haziness. Trace perihepatic ascites. Musculoskeletal: Curvature of the spine. Scattered degenerative changes seen of the spine and pelvis. Trace retrolisthesis with stenosis at L2-3. Critical Value/emergent results were called by telephone at the time of interpretation on 08/17/2022 at 11:21 am to provider HALEY SAGE , who verbally acknowledged these results. IMPRESSION: 3.2 cm complex fluid  and soft tissue area adjacent to the base of the cecum laterally in the right hemipelvis. This has separate from the ileocecal valve and the appendix is not seen as a separate structure. In light of the patient's history and appearance this very well could be sequela appendicitis. Possible small abscess. Enlarged heart with dilated IVC. Changes of chronic liver disease with fatty infiltration nodular contour. There is also hypertrophy of segment 3 of the liver. Please correlate with the history. Trace ascites. Subtle redundant loops of bowel with scattered stool. No obstruction. Tiny left pleural effusion Electronically Signed   By: Karen Kays M.D.   On: 08/17/2022 14:34    Pending Labs Unresulted Labs (From admission, onward)     Start     Ordered   08/18/22 0500  Comprehensive metabolic panel  Tomorrow morning,   R        08/17/22 1608   08/17/22 1610  Magnesium  Once,   R        08/17/22 1609            Vitals/Pain Today's Vitals   08/17/22 1530 08/17/22 1545 08/17/22 1546 08/17/22 1600  BP: (!) 173/60   (!) 167/61  Pulse: 65   72  Resp:    18  Temp:   98 F (36.7 C)   TempSrc:   Oral   SpO2:  96%  97%  PainSc:        Isolation Precautions No active isolations  Medications Medications  cefTRIAXone (ROCEPHIN) 2 g in sodium chloride 0.9 % 100 mL IVPB (2 g Intravenous New Bag/Given 08/17/22 1544)    And  metroNIDAZOLE (FLAGYL) IVPB 500 mg (has no administration in time range)  hydroxyurea (HYDREA) capsule 500 mg (has no administration in time range)  diltiazem (DILACOR XR) 24 hr capsule 180 mg (has no administration in time range)  losartan (COZAAR) tablet 100 mg (has no administration in time range)  torsemide (DEMADEX) tablet 20 mg (has no administration in time range)  methimazole (TAPAZOLE) tablet 5 mg (has no administration in time range)  psyllium (HYDROCIL/METAMUCIL) 1 packet (has no administration in time range)  b complex vitamins capsule 1 capsule (has no  administration in time range)  calcium-vitamin D 250-100 MG-UNIT  per tablet 1 tablet (has no administration in time range)  potassium chloride SA (KLOR-CON M) CR tablet 20 mEq (has no administration in time range)  loratadine (CLARITIN) tablet 10 mg (has no administration in time range)  timolol (TIMOPTIC) 0.5 % ophthalmic solution 1 drop (has no administration in time range)  acetaminophen (TYLENOL) tablet 650 mg (has no administration in time range)    Or  acetaminophen (TYLENOL) suppository 650 mg (has no administration in time range)  morphine (PF) 2 MG/ML injection 2 mg (has no administration in time range)  ondansetron (ZOFRAN) tablet 4 mg (has no administration in time range)    Or  ondansetron (ZOFRAN) injection 4 mg (has no administration in time range)  0.9 % NaCl with KCl 40 mEq / L  infusion (has no administration in time range)  iohexol (OMNIPAQUE) 300 MG/ML solution 100 mL (100 mLs Intravenous Contrast Given 08/17/22 1402)    Mobility walks

## 2022-08-17 NOTE — H&P (Signed)
History and Physical    Patient: [name]:[id number] DOB: [birth year] DOA: 08/17/2022 DOS: the patient was seen and examined on 08/17/2022 PCP: Gaspar Garbe, MD  Patient coming from: Home  Chief Complaint:  Chief Complaint  Patient presents with   Abdominal Pain   HPI: [name] is a 89 y.o. female with medical history significant for stage IIIa chronic kidney disease, COPD, pulmonary hypertension, multinodular goiter with history of thyrotoxicosis, polycythemia vera who presents to the emergency room via private vehicle for evaluation of abdominal pain. Patient states that symptoms started 2 days prior to admission and she initially describes periumbilical pain which she rated a 5 x 10 in intensity at its worst.  Pain then radiated to the right lower quadrant.  She denies having any nausea, no vomiting, no diarrhea, no fever or chills.  Pain persists but improved and patient went to see her primary care provider due to concerns for possible acute appendicitis and was referred to the ER for further evaluation. She complains of easy fatigability mostly with exertion. She denies having any chest pain, no shortness of breath, no cough, no dizziness, no constipation, no lightheadedness, no urinary symptoms, no blurred vision, no focal deficit, no palpitations or diaphoresis. Abnormal labs include leukocytosis with a white count of 13.6, lipase 73, platelet count 448, potassium 3.4, creatinine 1.05 CT scan of abdomen and pelvis shows 3.2 cm complex fluid and soft tissue area adjacent to the base of the cecum laterally in the right hemipelvis. This has separate from the ileocecal valve and the appendix is not seen as a separate structure. In light of the patient's history and appearance this very well could be sequela appendicitis. Possible small abscess. Enlarged heart with dilated IVC. Changes of chronic liver disease with fatty infiltration nodular contour. There is also  hypertrophy of segment 3 of the liver. Please correlate with the history. Trace ascites.Subtle redundant loops of bowel with scattered stool. No obstruction. Tiny left pleural effusion. General surgery was consulted by the ER physician who recommended admission to medicine due to her history of pulmonary hypertension. Patient received a dose of Rocephin and Flagyl in the ER and will be admitted to the hospital for further evaluation      Review of Systems: As mentioned in the history of present illness. All other systems reviewed and are negative. Past Medical History:  Diagnosis Date   Osteopenia    Thyroid disease    Varicose veins VENOUS INSUFFICIENCY SLOW HEALING OF WOUNDS [name] TO LOWER EXTEMETIES   Venous stasis dermatitis    Past Surgical History:  Procedure Laterality Date   BREAST CYST ASPIRATION Left    EYE SURGERY     TONSILLECTOMY     Social History:  reports that she has never smoked. She has never used smokeless tobacco. She reports that she does not drink alcohol and does not use drugs.  Allergies  Allergen Reactions   Doxycycline Calcium Other (See Comments)    Per patient "Intestines do not tolerate very well"  Other Reaction(s): GI upset   Penicillin V Other (See Comments)    Family History  Problem Relation Age of Onset   Heart disease Father    Cancer Son    Cancer Maternal Aunt    Cancer Paternal Uncle    Cancer Maternal Grandfather    Breast cancer Neg Hx     Prior to Admission medications   Medication Sig Start Date End Date Taking? Authorizing Provider  aspirin 81  MG tablet Take 81 mg by mouth daily.    [provider]  b complex vitamins capsule Take 1 capsule by mouth daily.    [provider]  calcium-vitamin D 250-100 MG-UNIT per tablet Take 1 tablet by mouth 2 (two) times daily.    [provider]  cetirizine (ZYRTEC) 10 MG tablet Take 10 mg by mouth daily.    [provider]  diltiazem (DILACOR XR)  180 MG 24 hr capsule Take 180 mg by mouth daily.    [provider]  hydroxyurea (HYDREA) 500 MG capsule Take 1 capsule (500 mg total) by mouth daily. May take with food to minimize GI side effects. 07/22/22   Johney Maine, MD  losartan (COZAAR) 100 MG tablet TAKE 1 TABLET BY MOUTH DAILY 07/24/22   Patwardhan, Anabel Bene, MD  methimazole (TAPAZOLE) 5 MG tablet Take 5 mg by mouth daily.    [provider]  potassium chloride SA (KLOR-CON M) 20 MEQ tablet TAKE 1 TABLET BY MOUTH TWICE DAILY 01/29/22   Patwardhan, Manish J, MD  psyllium (METAMUCIL) 58.6 % packet Take 1 packet by mouth daily.    [provider]  timolol (TIMOPTIC) 0.5 % ophthalmic solution Place 1 drop into both eyes daily.    [provider]  torsemide (DEMADEX) 20 MG tablet Take 1 tablet (20 mg total) by mouth 2 (two) times daily. 07/28/22 07/23/23  Elder Negus, MD    Physical Exam: Vitals:   08/17/22 1545 08/17/22 1546 08/17/22 1600 08/17/22 1654  BP:   (!) 167/61 (!) 163/61  Pulse:   72 71  Resp:   18 18  Temp:  98 F (36.7 C)  98.2 F (36.8 C)  TempSrc:  Oral  Oral  SpO2: 96%  97% 97%  Physical Exam Vitals and nursing note reviewed.  Constitutional:      Appearance: She is well-developed.  HENT:     Head: Normocephalic and atraumatic.  Eyes:     Extraocular Movements: Extraocular movements intact.     Pupils: Pupils are equal, round, and reactive to light.  Cardiovascular:     Rate and Rhythm: Normal rate and regular rhythm.  Pulmonary:     Effort: Pulmonary effort is normal.     Breath sounds: Normal breath sounds.  Abdominal:     General: Abdomen is flat. Bowel sounds are normal.     Palpations: Abdomen is soft.     Tenderness: There is abdominal tenderness in the right lower quadrant.  Genitourinary:    Rectum: Normal.  Skin:    General: Skin is warm and dry.  Neurological:     General: No focal deficit present.     Mental Status: She is alert.  Psychiatric:         Mood and Affect: Mood normal.        Behavior: Behavior normal.       Data Reviewed: Relevant notes from primary care and specialist visits, past discharge summaries as available in EHR, including Care Everywhere. Prior diagnostic testing as pertinent to current admission diagnoses Updated medications and problem lists for reconciliation ED course, including vitals, labs, imaging, treatment and response to treatment Triage notes, nursing and pharmacy notes and ED provider's notes Notable results as noted in HPI Labs reviewed.  White count 13.6, hemoglobin 14.2, hematocrit 43.0, RDW 14.2, platelet count 448, lipase 73, sodium 138, potassium 3.4, chloride 99, bicarb 30, glucose 87, BUN 27, creatinine 1.05, calcium 9.1, total protein 7.2, albumin 3.8,  AST 28, ALT 22, alkaline phosphatase 80, total bilirubin 1.1 There are no new results to review at this time.  Assessment and Plan: * Acute appendicitis Patient presents to the ER for evaluation of abdominal pain which initially started in the periumbilical area but is now localized to the right lower quadrant. CT scan of abdomen and pelvis shows 3.2 cm complex fluid and soft tissue area adjacent to the base of the cecum laterally in the right hemipelvis. This has separate from the ileocecal valve and the appendix is not seen as a separate structure. In light of the patient's history and appearance this very well could be sequela appendicitis. Possible small abscess. Consult surgery Cardiology consult for preoperative evaluation Place patient empirically on antibiotic therapy with Rocephin and Flagyl Keep patient n.p.o. for now    Polycythemia vera (HCC) Continue Hydrea  Hypokalemia Secondary to diuretic therapy Supplement potassium  Thyroid disease Stable Continue methimazole  Pulmonary hypertension, unspecified (HCC) Patient with a known history of pulmonary hypertension Continue torsemide and losartan  Essential  hypertension Continue losartan and diltiazem      Advance Care Planning:   Code Status: Full Code   Consults: Cardiology, general surgery  Family Communication: Greater than 50% of time was spent discussing patient's condition and plan of care with her and her husband at the bedside.  All questions and concerns have been addressed.  They verbalized understanding and agree with the plan.  CODE STATUS was discussed and she wishes to be a full code  Severity of Illness: The appropriate patient status for this patient is INPATIENT. Inpatient status is judged to be reasonable and necessary in order to provide the required intensity of service to ensure the patient's safety. The patient's presenting symptoms, physical exam findings, and initial radiographic and laboratory data in the context of their chronic comorbidities is felt to place them at high risk for further clinical deterioration. Furthermore, it is not anticipated that the patient will be medically stable for discharge from the hospital within 2 midnights of admission.   * I certify that at the point of admission it is my clinical judgment that the patient will require inpatient hospital care spanning beyond 2 midnights from the point of admission due to high intensity of service, high risk for further deterioration and high frequency of surveillance required.*  Author: Lucile Shutters, MD 08/17/2022 4:59 PM  For on call review [email or web address].

## 2022-08-18 ENCOUNTER — Inpatient Hospital Stay (HOSPITAL_COMMUNITY): Payer: Medicare PPO | Admitting: Certified Registered Nurse Anesthetist

## 2022-08-18 ENCOUNTER — Encounter (HOSPITAL_COMMUNITY): Payer: Self-pay | Admitting: Internal Medicine

## 2022-08-18 ENCOUNTER — Encounter (HOSPITAL_COMMUNITY)
Admission: EM | Disposition: A | Discharge status: Home / Self Care | Payer: Self-pay | Source: Ambulatory Visit | Attending: Internal Medicine

## 2022-08-18 ENCOUNTER — Other Ambulatory Visit: Payer: Self-pay

## 2022-08-18 DIAGNOSIS — I739 Peripheral vascular disease, unspecified: Secondary | ICD-10-CM

## 2022-08-18 DIAGNOSIS — I10 Essential (primary) hypertension: Secondary | ICD-10-CM

## 2022-08-18 DIAGNOSIS — I272 Pulmonary hypertension, unspecified: Secondary | ICD-10-CM

## 2022-08-18 DIAGNOSIS — K3533 Acute appendicitis with perforation and localized peritonitis, with abscess: Secondary | ICD-10-CM

## 2022-08-18 HISTORY — PX: LAPAROSCOPIC APPENDECTOMY: SHX408

## 2022-08-18 LAB — COMPREHENSIVE METABOLIC PANEL
ALT: 18 U/L (ref 0–44)
AST: 24 U/L (ref 15–41)
Albumin: 3.1 g/dL — ABNORMAL LOW (ref 3.5–5.0)
Alkaline Phosphatase: 73 U/L (ref 38–126)
Anion gap: 8 (ref 5–15)
BUN: 20 mg/dL (ref 8–23)
CO2: 30 mmol/L (ref 22–32)
Calcium: 8.8 mg/dL — ABNORMAL LOW (ref 8.9–10.3)
Chloride: 101 mmol/L (ref 98–111)
Creatinine, Ser: 0.99 mg/dL (ref 0.44–1.00)
GFR, Estimated: 55 mL/min — ABNORMAL LOW (ref >60)
Glucose, Bld: 94 mg/dL (ref 70–99)
Potassium: 3.7 mmol/L (ref 3.5–5.1)
Sodium: 139 mmol/L (ref 135–145)
Total Bilirubin: 0.9 mg/dL (ref 0.3–1.2)
Total Protein: 6.2 g/dL — ABNORMAL LOW (ref 6.5–8.1)

## 2022-08-18 SURGERY — APPENDECTOMY, LAPAROSCOPIC
Anesthesia: General

## 2022-08-18 MED ORDER — DROPERIDOL 2.5 MG/ML IJ SOLN: 0.6250 mg | Freq: Once | INTRAMUSCULAR | Status: DC | PRN

## 2022-08-18 MED ORDER — PHENYLEPHRINE 80 MCG/ML (10ML) SYRINGE FOR IV PUSH (FOR BLOOD PRESSURE SUPPORT)
PREFILLED_SYRINGE | INTRAVENOUS | Status: DC | PRN
  Administered 2022-08-18: 40 ug via INTRAVENOUS

## 2022-08-18 MED ORDER — CHLORHEXIDINE GLUCONATE 0.12 % MT SOLN
15.0000 mL | Freq: Once | OROMUCOSAL | Status: AC
  Administered 2022-08-18: 15 mL via OROMUCOSAL

## 2022-08-18 MED ORDER — ACETAMINOPHEN 10 MG/ML IV SOLN
INTRAVENOUS | Status: AC
  Filled 2022-08-18: qty 100

## 2022-08-18 MED ORDER — DEXAMETHASONE SODIUM PHOSPHATE 10 MG/ML IJ SOLN
INTRAMUSCULAR | Status: DC | PRN
  Administered 2022-08-18: 4 mg via INTRAVENOUS

## 2022-08-18 MED ORDER — LACTATED RINGERS IV SOLN: INTRAVENOUS | Status: DC

## 2022-08-18 MED ORDER — LIDOCAINE 2% (20 MG/ML) 5 ML SYRINGE
INTRAMUSCULAR | Status: DC | PRN
  Administered 2022-08-18: 50 mg via INTRAVENOUS

## 2022-08-18 MED ORDER — ROCURONIUM BROMIDE 10 MG/ML (PF) SYRINGE
PREFILLED_SYRINGE | INTRAVENOUS | Status: DC | PRN
  Administered 2022-08-18: 35 mg via INTRAVENOUS

## 2022-08-18 MED ORDER — ONDANSETRON HCL 4 MG/2ML IJ SOLN
INTRAMUSCULAR | Status: DC | PRN
  Administered 2022-08-18: 4 mg via INTRAVENOUS

## 2022-08-18 MED ORDER — BUPIVACAINE HCL 0.5 % IJ SOLN
INTRAMUSCULAR | Status: DC | PRN
  Administered 2022-08-18: 15 mL

## 2022-08-18 MED ORDER — CHLORHEXIDINE GLUCONATE CLOTH 2 % EX PADS: 6.0000 | MEDICATED_PAD | Freq: Once | CUTANEOUS | Status: DC

## 2022-08-18 MED ORDER — CHLORHEXIDINE GLUCONATE CLOTH 2 % EX PADS
6.0000 | MEDICATED_PAD | Freq: Once | CUTANEOUS | Status: AC
  Administered 2022-08-18: 6 via TOPICAL

## 2022-08-18 MED ORDER — LIDOCAINE HCL (PF) 2 % IJ SOLN
INTRAMUSCULAR | Status: AC
  Filled 2022-08-18: qty 5

## 2022-08-18 MED ORDER — BUPIVACAINE HCL 0.25 % IJ SOLN
INTRAMUSCULAR | Status: AC
  Filled 2022-08-18: qty 1

## 2022-08-18 MED ORDER — DEXAMETHASONE SODIUM PHOSPHATE 10 MG/ML IJ SOLN
INTRAMUSCULAR | Status: AC
  Filled 2022-08-18: qty 1

## 2022-08-18 MED ORDER — SUGAMMADEX SODIUM 200 MG/2ML IV SOLN
INTRAVENOUS | Status: DC | PRN
  Administered 2022-08-18: 180 mg via INTRAVENOUS

## 2022-08-18 MED ORDER — PROPOFOL 10 MG/ML IV BOLUS
INTRAVENOUS | Status: DC | PRN
  Administered 2022-08-18: 80 mg via INTRAVENOUS

## 2022-08-18 MED ORDER — MORPHINE SULFATE (PF) 2 MG/ML IV SOLN: 2.0000 mg | INTRAVENOUS | Status: DC | PRN

## 2022-08-18 MED ORDER — FENTANYL CITRATE (PF) 100 MCG/2ML IJ SOLN
INTRAMUSCULAR | Status: AC
  Filled 2022-08-18: qty 2

## 2022-08-18 MED ORDER — ACETAMINOPHEN 10 MG/ML IV SOLN
INTRAVENOUS | Status: DC | PRN
  Administered 2022-08-18: 1000 mg via INTRAVENOUS

## 2022-08-18 MED ORDER — LACTATED RINGERS IV SOLN
INTRAVENOUS | Status: AC | PRN
  Administered 2022-08-18: 1000 mL

## 2022-08-18 MED ORDER — SUCCINYLCHOLINE CHLORIDE 200 MG/10ML IV SOSY
PREFILLED_SYRINGE | INTRAVENOUS | Status: DC | PRN
  Administered 2022-08-18: 50 mg via INTRAVENOUS

## 2022-08-18 MED ORDER — TRAMADOL HCL 50 MG PO TABS: 50.0000 mg | ORAL_TABLET | Freq: Four times a day (QID) | ORAL | Status: DC | PRN

## 2022-08-18 MED ORDER — ONDANSETRON HCL 4 MG/2ML IJ SOLN
INTRAMUSCULAR | Status: AC
  Filled 2022-08-18: qty 2

## 2022-08-18 MED ORDER — PROPOFOL 10 MG/ML IV BOLUS
INTRAVENOUS | Status: AC
  Filled 2022-08-18: qty 20

## 2022-08-18 MED ORDER — FENTANYL CITRATE (PF) 100 MCG/2ML IJ SOLN
INTRAMUSCULAR | Status: DC | PRN
  Administered 2022-08-18: 25 ug via INTRAVENOUS
  Administered 2022-08-18: 50 ug via INTRAVENOUS
  Administered 2022-08-18: 25 ug via INTRAVENOUS

## 2022-08-18 MED ORDER — ACETAMINOPHEN 500 MG PO TABS
1000.0000 mg | ORAL_TABLET | Freq: Four times a day (QID) | ORAL | Status: DC
  Administered 2022-08-18 – 2022-08-19 (×3): 1000 mg via ORAL
  Filled 2022-08-18 (×3): qty 2

## 2022-08-18 MED ORDER — PHENYLEPHRINE HCL-NACL 20-0.9 MG/250ML-% IV SOLN
INTRAVENOUS | Status: DC | PRN
  Administered 2022-08-18: 20 ug/min via INTRAVENOUS

## 2022-08-18 MED ORDER — ROCURONIUM BROMIDE 10 MG/ML (PF) SYRINGE
PREFILLED_SYRINGE | INTRAVENOUS | Status: AC
  Filled 2022-08-18: qty 10

## 2022-08-18 MED ORDER — FENTANYL CITRATE PF 50 MCG/ML IJ SOSY: 25.0000 ug | PREFILLED_SYRINGE | INTRAMUSCULAR | Status: DC | PRN

## 2022-08-18 MED ORDER — SODIUM CHLORIDE 0.9 % IV SOLN: INTRAVENOUS | Status: DC | PRN

## 2022-08-18 MED ORDER — SUCCINYLCHOLINE CHLORIDE 200 MG/10ML IV SOSY
PREFILLED_SYRINGE | INTRAVENOUS | Status: AC
  Filled 2022-08-18: qty 10

## 2022-08-18 SURGICAL SUPPLY — 45 items
ADH SKN CLS APL DERMABOND .7 (GAUZE/BANDAGES/DRESSINGS) ×1
APL PRP STRL LF DISP 70% ISPRP (MISCELLANEOUS) ×1
APPLIER CLIP ROT 10 11.4 M/L (STAPLE)
APR CLP MED LRG 11.4X10 (STAPLE)
BAG COUNTER SPONGE SURGICOUNT (BAG) IMPLANT
BAG SPNG CNTER NS LX DISP (BAG)
CHLORAPREP W/TINT 26 (MISCELLANEOUS) ×2 IMPLANT
CLIP APPLIE ROT 10 11.4 M/L (STAPLE) IMPLANT
COVER SURGICAL LIGHT HANDLE (MISCELLANEOUS) ×2 IMPLANT
CUTTER FLEX LINEAR 45M (STAPLE) IMPLANT
DERMABOND ADVANCED .7 DNX12 (GAUZE/BANDAGES/DRESSINGS) ×2 IMPLANT
DRAPE LAPAROSCOPIC ABDOMINAL (DRAPES) ×2 IMPLANT
ELECT PENCIL ROCKER SW 15FT (MISCELLANEOUS) IMPLANT
ELECT REM PT RETURN 15FT ADLT (MISCELLANEOUS) ×2 IMPLANT
ENDOLOOP SUT PDS II  0 18 (SUTURE)
ENDOLOOP SUT PDS II 0 18 (SUTURE) IMPLANT
GLOVE SURG ORTHO 8.0 STRL STRW (GLOVE) ×2 IMPLANT
GLOVE SURG SYN 7.5  E (GLOVE) ×1
GLOVE SURG SYN 7.5 E (GLOVE) ×1 IMPLANT
GLOVE SURG SYN 7.5 PF PI (GLOVE) ×2 IMPLANT
GOWN STRL REUS W/ TWL XL LVL3 (GOWN DISPOSABLE) ×4 IMPLANT
GOWN STRL REUS W/TWL XL LVL3 (GOWN DISPOSABLE) ×2
IRRIG SUCT STRYKERFLOW 2 WTIP (MISCELLANEOUS) ×1
IRRIGATION SUCT STRKRFLW 2 WTP (MISCELLANEOUS) ×2 IMPLANT
KIT BASIN OR (CUSTOM PROCEDURE TRAY) ×2 IMPLANT
KIT TURNOVER KIT A (KITS) IMPLANT
RELOAD 45 VASCULAR/THIN (ENDOMECHANICALS) IMPLANT
RELOAD STAPLE 45 2.5 WHT GRN (ENDOMECHANICALS) IMPLANT
RELOAD STAPLE 45 3.5 BLU ETS (ENDOMECHANICALS) IMPLANT
RELOAD STAPLE TA45 3.5 REG BLU (ENDOMECHANICALS) ×1 IMPLANT
SET TUBE SMOKE EVAC HIGH FLOW (TUBING) ×2 IMPLANT
SHEARS HARMONIC ACE PLUS 36CM (ENDOMECHANICALS) ×2 IMPLANT
SPIKE FLUID TRANSFER (MISCELLANEOUS) ×2 IMPLANT
STRIP CLOSURE SKIN 1/2X4 (GAUZE/BANDAGES/DRESSINGS) ×2 IMPLANT
SUT MNCRL AB 4-0 PS2 18 (SUTURE) ×2 IMPLANT
SYS BAG RETRIEVAL 10MM (BASKET) ×1
SYSTEM BAG RETRIEVAL 10MM (BASKET) ×2 IMPLANT
TOWEL OR 17X26 10 PK STRL BLUE (TOWEL DISPOSABLE) ×2 IMPLANT
TOWEL OR NON WOVEN STRL DISP B (DISPOSABLE) ×2 IMPLANT
TRAY FOLEY MTR SLVR 14FR STAT (SET/KITS/TRAYS/PACK) IMPLANT
TRAY FOLEY MTR SLVR 16FR STAT (SET/KITS/TRAYS/PACK) IMPLANT
TRAY LAPAROSCOPIC (CUSTOM PROCEDURE TRAY) ×2 IMPLANT
TROCAR 11X100 Z THREAD (TROCAR) ×2 IMPLANT
TROCAR BALLN 12MMX100 BLUNT (TROCAR) ×2 IMPLANT
TROCAR Z-THREAD OPTICAL 5X100M (TROCAR) ×2 IMPLANT

## 2022-08-18 NOTE — Discharge Instructions (Signed)
[facility]  Please arrive at least 30 min before your appointment to complete your check in paperwork.  If you are unable to arrive 30 min prior to your appointment time we may have to cancel or reschedule you. LAPAROSCOPIC SURGERY: POST OP INSTRUCTIONS Always review your discharge instruction sheet given to you by the facility where your surgery was performed. IF YOU HAVE DISABILITY OR FAMILY LEAVE FORMS, YOU MUST BRING THEM TO THE OFFICE FOR PROCESSING.   DO NOT GIVE THEM TO YOUR DOCTOR.  PAIN CONTROL  First take acetaminophen (Tylenol) AND/or ibuprofen (Advil) to control your pain after surgery.  Follow directions on package.  Taking acetaminophen (Tylenol) and/or ibuprofen (Advil) regularly after surgery will help to control your pain and lower the amount of prescription pain medication you may need.  You should not take more than 4,000 mg (4 grams) of acetaminophen (Tylenol) in 24 hours.  You should not take ibuprofen (Advil), aleve, motrin, naprosyn or other NSAIDS if you have a history of stomach ulcers or chronic kidney disease.  A prescription for pain medication may be given to you upon discharge.  Take your pain medication as prescribed, if you still have uncontrolled pain after taking acetaminophen (Tylenol) or ibuprofen (Advil). Use ice packs to help control pain. If you need a refill on your pain medication, please contact your pharmacy.  They will contact our office to request authorization. Prescriptions will not be filled after 5pm or on week-ends.  HOME MEDICATIONS Take your usually prescribed medications unless otherwise directed.  DIET You should follow a light diet the first few days after arrival home.  Be sure to include lots of fluids daily. Avoid fatty, fried foods.   CONSTIPATION It is common to experience some constipation after surgery and if you are taking pain medication.  Increasing fluid intake and taking a stool softener (such as Colace)  will usually help or prevent this problem from occurring.  A mild laxative (Milk of Magnesia or Miralax) should be taken according to package instructions if there are no bowel movements after 48 hours.  WOUND/INCISION CARE Most patients will experience some swelling and bruising in the area of the incisions.  Ice packs will help.  Swelling and bruising can take several days to resolve.  Unless discharge instructions indicate otherwise, follow guidelines below  STERI-STRIPS - you may remove your outer bandages 48 hours after surgery, and you may shower at that time.  You have steri-strips (small skin tapes) in place directly over the incision.  These strips should be left on the skin for 7-10 days.   DERMABOND/SKIN GLUE - you may shower in 24 hours.  The glue will flake off over the next 2-3 weeks. Any sutures or staples will be removed at the office during your follow-up visit.  ACTIVITIES You may resume regular (light) daily activities beginning the next day--such as daily self-care, walking, climbing stairs--gradually increasing activities as tolerated.  You may have sexual intercourse when it is comfortable.  Refrain from any heavy lifting or straining until approved by your doctor. You may drive when you are no longer taking prescription pain medication, you can comfortably wear a seatbelt, and you can safely maneuver your car and apply brakes.  FOLLOW-UP You should see your doctor in the office for a follow-up appointment approximately 2-3 weeks after your surgery.  You should have been given your post-op/follow-up appointment when your surgery was scheduled.  If you did not receive a post-op/follow-up appointment, make sure   that you call for this appointment within a day or two after you arrive home to insure a convenient appointment time.   WHEN TO CALL YOUR DOCTOR: Fever over 101.0 Inability to urinate Continued bleeding from incision. Increased pain, redness, or drainage from the  incision. Increasing abdominal pain  The clinic staff is available to answer your questions during regular business hours.  Please don't hesitate to call and ask to speak to one of the nurses for clinical concerns.  If you have a medical emergency, go to the nearest emergency room or call 911.  A surgeon from [facility] is always on call at the hospital. [address], [place], [place]  [address] ? [address] [address], [place], [place]   [address] [phone] ? [phone] ? FAX [phone]    Resume Losartan 5/02. If BP below 130 , do not take it.

## 2022-08-18 NOTE — Plan of Care (Signed)
  Problem: Education: Goal: Knowledge of General Education information will improve Description Including pain rating scale, medication(s)/side effects and non-pharmacologic comfort measures Outcome: Progressing   

## 2022-08-18 NOTE — TOC CM/SW Note (Signed)
Transition of Care [facility]) Screening Note  Patient Details  Name: [name] Date of Birth: 1932/07/27  Transition of Care [facility]) CM/SW Contact:    Ewing Schlein, LCSW Phone Number: 08/18/2022, 11:32 AM  Transition of Care Department [facility]) has reviewed patient and no TOC needs have been identified at this time. We will continue to monitor patient advancement through interdisciplinary progression rounds. If new patient transition needs arise, please place a TOC consult.

## 2022-08-18 NOTE — Anesthesia Preprocedure Evaluation (Addendum)
Anesthesia Evaluation  Patient identified by MRN, date of birth, ID band Patient awake    Reviewed: Allergy & Precautions, NPO status , Patient's Chart, lab work & pertinent test results  Airway Mallampati: II  TM Distance: >3 FB Neck ROM: Full    Dental no notable dental hx. (+) Dental Advisory Given, Teeth Intact   Pulmonary neg pulmonary ROS   Pulmonary exam normal breath sounds clear to auscultation       Cardiovascular hypertension, Pt. on medications pulmonary hypertension+ Peripheral Vascular Disease  Normal cardiovascular exam Rhythm:Regular Rate:Normal  Echocardiogram 07/27/2022:  Left ventricle cavity is normal in size and wall thickness. Normal global wall motion. Normal LV systolic function with EF 69%. Normal diastolic filling pattern.  Left atrial cavity is mildly dilated.  Right atrial cavity is severely dilated.  Right ventricle cavity is mildly dilated. Normal right ventricular function.  Structurally normal trileaflet aortic valve.  Mild (Grade I) aortic regurgitation.  Mild to moderate mitral regurgitation.  Moderate to severe tricuspid regurgitation. Estimated pulmonary artery systolic pressure 60 mmHg.  Mild pulmonic regurgitation.  Trace pericardial effusion.  Previous study on 01/22/2022 estimated PASP at 51 mmHg.     Neuro/Psych negative neurological ROS     GI/Hepatic negative GI ROS, Neg liver ROS,,,  Endo/Other  negative endocrine ROS    Renal/GU negative Renal ROS     Musculoskeletal negative musculoskeletal ROS (+)    Abdominal   Peds  Hematology negative hematology ROS (+)   Anesthesia Other Findings   Reproductive/Obstetrics                             Anesthesia Physical Anesthesia Plan  ASA: 4  Anesthesia Plan: General   Post-op Pain Management: Ofirmev IV (intra-op)*   Induction: Intravenous, Rapid sequence and Cricoid pressure planned  PONV Risk  Score and Plan: 4 or greater and Ondansetron, Dexamethasone and Treatment may vary due to age or medical condition  Airway Management Planned: Oral ETT  Additional Equipment:   Intra-op Plan:   Post-operative Plan: Possible Post-op intubation/ventilation  Informed Consent: I have reviewed the patients History and Physical, chart, labs and discussed the procedure including the risks, benefits and alternatives for the proposed anesthesia with the patient or authorized representative who has indicated his/her understanding and acceptance.     Dental advisory given  Plan Discussed with: CRNA  Anesthesia Plan Comments:         Anesthesia Quick Evaluation

## 2022-08-18 NOTE — Op Note (Signed)
OPERATIVE REPORT - LAPAROSCOPIC APPENDECTOMY  Preop diagnosis:  Acute appendicitis with abscess  Postop diagnosis:  same  Procedure:  Laparoscopic appendectomy  Surgeon:  Darnell Level, MD  Anesthesia:  general endotracheal  Estimated blood loss:  minimal  Preparation:  Chlora-prep  Complications:  none  Indications: Patient is an 89 year old female admitted with signs and symptoms of acute appendicitis with possible abscess.  Patient was admitted to the medical service and evaluated by cardiology with a history of pulmonary hypertension.  She was cleared for the operating room and now comes to surgery for diagnostic laparoscopy and probable appendectomy.  Procedure:  Patient was brought to the operating room and placed in a supine position on the operating room table. Following administration of general anesthesia, a time out was held and the patient's name and procedure was confirmed. Patient was then prepped and draped in the usual strict aseptic fashion.  After ascertaining that an adequate level of anesthesia had been achieved, a peri-umbilical incision was made with a #15 blade. Dissection was carried down to the fascia. Fascia was incised in the midline and the peritoneal cavity was entered cautiously. A #0-vicryl pursestring suture was placed in the fascia. An Hassan cannula was introduced under direct vision and secured with the pursestring suture. The abdomen was insufflated with carbon dioxide. The laparoscope was introduced and the abdomen was explored. Operative ports were placed in the right upper quadrant and left lower quadrant.  The appendix was adherent to the right lower quadrant of the abdominal wall.  Adhesions were taken down bluntly.  Fibrinous exudate was evacuated.  Appendix was mobilized off of the cecum.  Mesoappendix was divided with the harmonic scalpel.  Dissection was carried down to the base of the appendix which appeared relatively normal.  It was difficult to  tell whether the appendix was just markedly dilated and fluid-filled or if there was an abscess encircled by the appendiceal tissue.  The appendix was elevated and the base of the appendix was transected at the cecal wall with an Endo GIA stapler.  Good hemostasis was noted across the staple line.  The appendix was placed into an endo-catch bag and withdrawn through the umbilical port. The #0-vicryl pursestring suture was tied securely.  Right lower quadrant was irrigated with warm saline which was evacuated. Good hemostasis was noted. Ports were removed under direct vision. Good hemostasis was noted at the port sites. Pneumoperitoneum was released.  Skin incisions were anesthetized with local anesthetic. Wounds were closed with interrupted 4-0 Monocryl subcuticular sutures. Wounds were washed and dried and Dermabond was applied. The patient was awakened from anesthesia and brought to the recovery room. The patient tolerated the procedure well.  Darnell Level, MD [facility] Office: [phone]

## 2022-08-18 NOTE — Progress Notes (Signed)
PROGRESS NOTE  [name]:[id number] DOB: 05/14/1932 DOA: 08/17/2022 PCP: Gaspar Garbe, MD  HPI/Recap of past 24 hours: [name] is a 89 y.o. female with medical history significant for stage IIIa CKD, COPD, pulmonary hypertension, multinodular goiter with history of thyrotoxicosis, PCV who presents to the ED c/o abdominal pain that started 2 days PTA. Abdominal pain is periumbilical pain, radiates to the right lower quadrant. Patient went to see her primary care provider due to concerns for possible acute appendicitis and was referred to the ER for further evaluation. In the ED, labs showed leukocytosis with a white count of 13.6, lipase 73, platelet count 448, potassium 3.4, creatinine 1.05 CT scan of abdomen and pelvis shows 3.2 cm complex fluid and soft tissue area adjacent to the base of the cecum laterally in the right hemipelvis. In light of the patient's history and appearance this very well could be sequela appendicitis. Possible small abscess. General surgery was consulted by the ER physician who recommended admission to medicine for further management.    Today, saw pt prior to surgery, husband at bedside. Still with abdominal pain, denies any new complaints.    Assessment/Plan: Principal Problem:   Acute appendicitis Active Problems:   Essential hypertension   Pulmonary hypertension, unspecified (HCC)   Thyroid disease   Hypokalemia   Polycythemia vera (HCC)   Possible acute appendicitis with small abscess in RLQ Currently afebrile with leukocytosis CT abd/pelvis shows 3.2 cm complex fluid and soft tissue area adjacent to the base of the cecum laterally in the right hemipelvis. In light of the patient's history and appearance this very well could be sequela appendicitis. Possible small abscess Gen surg consulted Cardiology cleared for surgery Continue empiric Rocephin and Flagyl pending post op  Polycythemia vera (HCC) Continue Hydrea   Thyroid  disease Stable Continue methimazole   Pulmonary hypertension, unspecified Patient with a known history of pulmonary hypertension Continue torsemide and losartan   Essential hypertension Continue losartan and diltiazem      Estimated body mass index is 19.37 kg/m as calculated from the following:   Height as of this encounter: 5' (1.524 m).   Weight as of this encounter: 45 kg.     Code Status: Full  Family Communication: Discussed with husband at bedside  Disposition Plan: Status is: Inpatient Remains inpatient appropriate because: Level of care      Consultants: General surgery  Procedures: Plan for surgery as above  Antimicrobials: Ceftriaxone Flagyl  DVT prophylaxis: SCD   Objective: Vitals:   08/18/22 0149 08/18/22 0444 08/18/22 0945 08/18/22 1016  BP: (!) 142/59 137/66 (!) 136/112   Pulse: 78 73 72   Resp: 18 18 20    Temp: 98.4 F (36.9 C) 98.5 F (36.9 C) 98.5 F (36.9 C)   TempSrc: Oral Oral Oral   SpO2: 95% 94% 99%   Weight:    45 kg  Height:    5' (1.524 m)    Intake/Output Summary (Last 24 hours) at 08/18/2022 1055 Last data filed at 08/18/2022 1000 Gross per 24 hour  Intake 1184.34 ml  Output 3350 ml  Net -2165.66 ml   Filed Weights   08/18/22 1016  Weight: 45 kg    Exam: General: NAD  Cardiovascular: S1, S2 present Respiratory: CTAB Abdomen: Soft, tender, nondistended, bowel sounds present Musculoskeletal: No bilateral pedal edema noted Skin: Normal Psychiatry: Normal mood     Data Reviewed: CBC: Recent Labs  Lab 08/17/22 1230  WBC 13.6*  NEUTROABS 10.9*  HGB 14.2  HCT 43.0  MCV 115.6*  PLT 448*   Basic Metabolic Panel: Recent Labs  Lab 08/17/22 1230 08/18/22 0506  NA 138 139  K 3.4* 3.7  CL 99 101  CO2 30 30  GLUCOSE 87 94  BUN 27* 20  CREATININE 1.05* 0.99  CALCIUM 9.1 8.8*  MG 2.4  --    GFR: Estimated Creatinine Clearance: 28.4 mL/min (by C-G formula based on SCr of 0.99 mg/dL). Liver  Function Tests: Recent Labs  Lab 08/17/22 1230 08/18/22 0506  AST 28 24  ALT 23 18  ALKPHOS 80 73  BILITOT 1.1 0.9  PROT 7.2 6.2*  ALBUMIN 3.8 3.1*   Recent Labs  Lab 08/17/22 1230  LIPASE 73*   No results for input(s): "AMMONIA" in the last 168 hours. Coagulation Profile: No results for input(s): "INR", "PROTIME" in the last 168 hours. Cardiac Enzymes: No results for input(s): "CKTOTAL", "CKMB", "CKMBINDEX", "TROPONINI" in the last 168 hours. BNP (last 3 results) No results for input(s): "PROBNP" in the last 8760 hours. HbA1C: No results for input(s): "HGBA1C" in the last 72 hours. CBG: No results for input(s): "GLUCAP" in the last 168 hours. Lipid Profile: No results for input(s): "CHOL", "HDL", "LDLCALC", "TRIG", "CHOLHDL", "LDLDIRECT" in the last 72 hours. Thyroid Function Tests: No results for input(s): "TSH", "T4TOTAL", "FREET4", "T3FREE", "THYROIDAB" in the last 72 hours. Anemia Panel: No results for input(s): "VITAMINB12", "FOLATE", "FERRITIN", "TIBC", "IRON", "RETICCTPCT" in the last 72 hours. Urine analysis:    Component Value Date/Time   COLORURINE STRAW (A) 08/17/2022 1320   APPEARANCEUR CLEAR 08/17/2022 1320   LABSPEC 1.008 08/17/2022 1320   PHURINE 7.0 08/17/2022 1320   GLUCOSEU NEGATIVE 08/17/2022 1320   HGBUR NEGATIVE 08/17/2022 1320   BILIRUBINUR NEGATIVE 08/17/2022 1320   KETONESUR NEGATIVE 08/17/2022 1320   PROTEINUR NEGATIVE 08/17/2022 1320   NITRITE NEGATIVE 08/17/2022 1320   LEUKOCYTESUR NEGATIVE 08/17/2022 1320   Sepsis Labs: @LABRCNTIP (procalcitonin:4,lacticidven:4)  )No results found for this or any previous visit (from the past 240 hour(s)).    Studies: CT ABDOMEN PELVIS W CONTRAST  Result Date: 08/17/2022 CLINICAL DATA:  Right lower quadrant abdominal pain. Elevated white blood cell count EXAM: CT ABDOMEN AND PELVIS WITH CONTRAST TECHNIQUE: Multidetector CT imaging of the abdomen and pelvis was performed using the standard protocol  following bolus administration of intravenous contrast. RADIATION DOSE REDUCTION: This exam was performed according to the departmental dose-optimization program which includes automated exposure control, adjustment of the mA and/or kV according to patient size and/or use of iterative reconstruction technique. CONTRAST:  OMNIPAQUE IOHEXOL 300 MG/ML  SOLN COMPARISON:  None FINDINGS: Lower chest: Heart is enlarged. Small pericardial effusion. Trace left-sided pleural effusion. There are some reticular changes of the bases with areas of bronchiectasis. Hepatobiliary: Mild fatty liver infiltration. Slight asymmetric enlargement of the left hepatic lobe with slightly nodular contours. Please correlate for any evidence of underlying chronic liver disease. Note is made of a distended IVC. No space-occupying liver lesion. Patent portal vein. Pancreas: Mild pancreatic atrophy with diffuse mildly enlarged pancreatic duct. No obvious mass initially there was a filling defect within the splenic vein with this does not persist on delayed imaging and may have been flow related. Spleen: Spleen has a cephalocaudal length of 11.1 cm. Nonenlarged. Preserved enhancement. Adrenals/Urinary Tract: Adrenal glands are preserved. Right kidney is slightly malrotated. No enhancing renal mass or collecting system dilatation. Extrarenal pelvis noted to each kidney. Preserved contours of the urinary bladder. Stomach/Bowel: Stomach is nondilated. On  this non oral contrast exam the small bowel is nondilated. Large bowel has some scattered stool. No bowel obstruction. Redundant loops identified. There is some stranding identified in the area of the ileocecal valve. Small area of fluid and thickening seen lateral to the cecum in the anterior right hemipelvis. This appears separate from the ileocecal valve on coronal imaging. This area measures proximally 2.8 by 2.1 by 3.2 cm. This is in the expected location of the appendix although the  appendix itself is poorly defined on this examination. With patient's clinical history this very well could be small abscess formation related to appendicitis. There is in principle differential based on clinical presentation and appearance. Vascular/Lymphatic: Normal caliber aorta and IVC. Scattered vascular calcifications. No specific abnormal lymph node enlargement. Reproductive: Uterus and bilateral adnexa are unremarkable. Other: Anasarca. No free air. Mild mesenteric haziness. Trace perihepatic ascites. Musculoskeletal: Curvature of the spine. Scattered degenerative changes seen of the spine and pelvis. Trace retrolisthesis with stenosis at L2-3. Critical Value/emergent results were called by telephone at the time of interpretation on 08/17/2022 at 11:21 am to provider HALEY SAGE , who verbally acknowledged these results. IMPRESSION: 3.2 cm complex fluid and soft tissue area adjacent to the base of the cecum laterally in the right hemipelvis. This has separate from the ileocecal valve and the appendix is not seen as a separate structure. In light of the patient's history and appearance this very well could be sequela appendicitis. Possible small abscess. Enlarged heart with dilated IVC. Changes of chronic liver disease with fatty infiltration nodular contour. There is also hypertrophy of segment 3 of the liver. Please correlate with the history. Trace ascites. Subtle redundant loops of bowel with scattered stool. No obstruction. Tiny left pleural effusion Electronically Signed   By: Karen Kays M.D.   On: 08/17/2022 14:34    Scheduled Meds:  [MAR Hold] B-complex with vitamin C  1 tablet Oral Daily   [MAR Hold] calcium-vitamin D  0.5 tablet Oral BID   [MAR Hold] diltiazem  180 mg Oral Daily   [MAR Hold] loratadine  10 mg Oral Daily   [MAR Hold] losartan  100 mg Oral QHS   [MAR Hold] methimazole  5 mg Oral Daily   [MAR Hold] potassium chloride SA  20 mEq Oral BID   [MAR Hold] psyllium  1 packet Oral  Daily   [MAR Hold] timolol  1 drop Both Eyes Daily   [MAR Hold] torsemide  20 mg Oral BID    Continuous Infusions:  sodium chloride Stopped (08/18/22 0955)   [MAR Hold] cefTRIAXone (ROCEPHIN)  IV     And   [MAR Hold] metronidazole Stopped (08/18/22 0609)   lactated ringers 10 mL/hr at 08/18/22 1053     LOS: 1 day     Briant Cedar, MD [facility]  If 7PM-7AM, please contact night-coverage [email or web address] 08/18/2022, 10:55 AM

## 2022-08-18 NOTE — Progress Notes (Signed)
Patient being transported to the O.R. now.

## 2022-08-18 NOTE — Transfer of Care (Signed)
Immediate Anesthesia Transfer of Care Note  Patient: [name]  Procedure(s) Performed: APPENDECTOMY LAPAROSCOPIC  Patient Location: PACU  Anesthesia Type:General  Level of Consciousness: drowsy and patient cooperative  Airway & Oxygen Therapy: Patient Spontanous Breathing and Patient connected to face mask oxygen  Post-op Assessment: Report given to RN and Post -op Vital signs reviewed and stable  Post vital signs: Reviewed and stable  Last Vitals:  Vitals Value Taken Time  BP 172/59 08/18/22 1301  Temp 37.1 C 08/18/22 1301  Pulse 65 08/18/22 1303  Resp 17 08/18/22 1303  SpO2 100 % 08/18/22 1303  Vitals shown include unvalidated device data.  Last Pain:  Vitals:   08/18/22 1016  TempSrc:   PainSc: 0-No pain         Complications: No notable events documented.

## 2022-08-18 NOTE — Anesthesia Procedure Notes (Signed)
Procedure Name: Intubation Date/Time: 08/18/2022 11:53 AM  Performed by: Epimenio Sarin, CRNAPre-anesthesia Checklist: Patient identified, Emergency Drugs available, Suction available, Patient being monitored and Timeout performed Patient Re-evaluated:Patient Re-evaluated prior to induction Oxygen Delivery Method: Circle system utilized Preoxygenation: Pre-oxygenation with 100% oxygen Induction Type: IV induction, Rapid sequence and Cricoid Pressure applied Laryngoscope Size: Mac and 3 Grade View: Grade II Tube type: Oral Tube size: 7.0 mm Number of attempts: 1 Airway Equipment and Method: Stylet Placement Confirmation: ETT inserted through vocal cords under direct vision, positive ETCO2 and breath sounds checked- equal and bilateral Secured at: 21 cm Tube secured with: Tape Dental Injury: Teeth and Oropharynx as per pre-operative assessment  Comments: Intubation by paramedic student.

## 2022-08-18 NOTE — Progress Notes (Signed)
Prior-To-Admission Oral Chemotherapy for Treatment of Oncologic Disease   Order noted from Dr. Joylene Igo to continue prior-to-admission oral chemotherapy regimen of Hydrea.  Oral chemotherapy Hydrea remains on hold per discussion with Dr Candise Che  on 08/18/2022.     Herby Abraham, Pharm.D Use secure chat for questions 08/18/2022 9:30 AM

## 2022-08-18 NOTE — Progress Notes (Signed)
Assessment & Plan: HD#2 - Abdominal pain with inflammatory process and small abscess in RLQ, possible appendicitis  NPO, IVF  Rocephin, Flagyl  Cleared by cardiology for OR  Plan to proceed with surgery this AM.  Anticipate laparoscopic appendectomy, possibly open procedure depending on operative findings.  Discussed with patient and her husband who understand and agree to proceed.  The risks and benefits of the procedure have been discussed at length with the patient.  The patient understands the proposed procedure, potential alternative treatments, and the course of recovery to be expected.  All of the patient's questions have been answered at this time.  The patient wishes to proceed with surgery.   FEN - NPO/IVFs per medicine VTE - ok for chemical prophylaxis from our standpoint ID - Rocephin/Flagyl   Pulmonary HTN HTN Polycythemia vera PAD Hyperthyroidism        Darnell Level, MD [facility] practice Office: [phone]        Chief Complaint: Abdominal pain  Subjective: Patient in bed, comfortable, husband at bedside.  Objective: Vital signs in last 24 hours: Temp:  [97.5 F (36.4 C)-98.5 F (36.9 C)] 98.5 F (36.9 C) (04/30 0444) Pulse Rate:  [65-78] 73 (04/30 0444) Resp:  [14-18] 18 (04/30 0444) BP: (137-185)/(57-68) 137/66 (04/30 0444) SpO2:  [94 %-98 %] 94 % (04/30 0444) Last BM Date :  (PTA)  Intake/Output from previous day: 04/29 0701 - 04/30 0700 In: 1149.7 [P.O.:600; I.V.:449.7; IV Piggyback:100] Out: 3150 [Urine:3150] Intake/Output this shift: Total I/O In: -  Out: 200 [Urine:200]  Physical Exam: HEENT - sclerae clear, mucous membranes moist Abdomen - soft without distension, mild RLQ tenderness Ext - no edema, non-tender Neuro - alert & oriented, no focal deficits  Lab Results:  Recent Labs    08/17/22 1230  WBC 13.6*  HGB 14.2  HCT 43.0  PLT 448*   BMET Recent Labs    08/17/22 1230 08/18/22 0506   NA 138 139  K 3.4* 3.7  CL 99 101  CO2 30 30  GLUCOSE 87 94  BUN 27* 20  CREATININE 1.05* 0.99  CALCIUM 9.1 8.8*   PT/INR No results for input(s): "LABPROT", "INR" in the last 72 hours. Comprehensive Metabolic Panel:    Component Value Date/Time   NA 139 08/18/2022 0506   NA 138 08/17/2022 1230   K 3.7 08/18/2022 0506   K 3.4 (L) 08/17/2022 1230   CL 101 08/18/2022 0506   CL 99 08/17/2022 1230   CO2 30 08/18/2022 0506   CO2 30 08/17/2022 1230   BUN 20 08/18/2022 0506   BUN 27 (H) 08/17/2022 1230   CREATININE 0.99 08/18/2022 0506   CREATININE 1.05 (H) 08/17/2022 1230   CREATININE 0.91 05/04/2022 0933   CREATININE 1.09 (H) 12/31/2021 1134   GLUCOSE 94 08/18/2022 0506   GLUCOSE 87 08/17/2022 1230   CALCIUM 8.8 (L) 08/18/2022 0506   CALCIUM 9.1 08/17/2022 1230   AST 24 08/18/2022 0506   AST 28 08/17/2022 1230   AST 25 05/04/2022 0933   AST 27 12/31/2021 1134   ALT 18 08/18/2022 0506   ALT 23 08/17/2022 1230   ALT 21 05/04/2022 0933   ALT 18 12/31/2021 1134   ALKPHOS 73 08/18/2022 0506   ALKPHOS 80 08/17/2022 1230   BILITOT 0.9 08/18/2022 0506   BILITOT 1.1 08/17/2022 1230   BILITOT 0.7 05/04/2022 0933   BILITOT 0.6 12/31/2021 1134   PROT 6.2 (L) 08/18/2022 0506   PROT 7.2 08/17/2022  1230   ALBUMIN 3.1 (L) 08/18/2022 0506   ALBUMIN 3.8 08/17/2022 1230    Studies/Results: CT ABDOMEN PELVIS W CONTRAST  Result Date: 08/17/2022 CLINICAL DATA:  Right lower quadrant abdominal pain. Elevated white blood cell count EXAM: CT ABDOMEN AND PELVIS WITH CONTRAST TECHNIQUE: Multidetector CT imaging of the abdomen and pelvis was performed using the standard protocol following bolus administration of intravenous contrast. RADIATION DOSE REDUCTION: This exam was performed according to the departmental dose-optimization program which includes automated exposure control, adjustment of the mA and/or kV according to patient size and/or use of iterative reconstruction technique.  CONTRAST:  OMNIPAQUE IOHEXOL 300 MG/ML  SOLN COMPARISON:  None FINDINGS: Lower chest: Heart is enlarged. Small pericardial effusion. Trace left-sided pleural effusion. There are some reticular changes of the bases with areas of bronchiectasis. Hepatobiliary: Mild fatty liver infiltration. Slight asymmetric enlargement of the left hepatic lobe with slightly nodular contours. Please correlate for any evidence of underlying chronic liver disease. Note is made of a distended IVC. No space-occupying liver lesion. Patent portal vein. Pancreas: Mild pancreatic atrophy with diffuse mildly enlarged pancreatic duct. No obvious mass initially there was a filling defect within the splenic vein with this does not persist on delayed imaging and may have been flow related. Spleen: Spleen has a cephalocaudal length of 11.1 cm. Nonenlarged. Preserved enhancement. Adrenals/Urinary Tract: Adrenal glands are preserved. Right kidney is slightly malrotated. No enhancing renal mass or collecting system dilatation. Extrarenal pelvis noted to each kidney. Preserved contours of the urinary bladder. Stomach/Bowel: Stomach is nondilated. On this non oral contrast exam the small bowel is nondilated. Large bowel has some scattered stool. No bowel obstruction. Redundant loops identified. There is some stranding identified in the area of the ileocecal valve. Small area of fluid and thickening seen lateral to the cecum in the anterior right hemipelvis. This appears separate from the ileocecal valve on coronal imaging. This area measures proximally 2.8 by 2.1 by 3.2 cm. This is in the expected location of the appendix although the appendix itself is poorly defined on this examination. With patient's clinical history this very well could be small abscess formation related to appendicitis. There is in principle differential based on clinical presentation and appearance. Vascular/Lymphatic: Normal caliber aorta and IVC. Scattered vascular  calcifications. No specific abnormal lymph node enlargement. Reproductive: Uterus and bilateral adnexa are unremarkable. Other: Anasarca. No free air. Mild mesenteric haziness. Trace perihepatic ascites. Musculoskeletal: Curvature of the spine. Scattered degenerative changes seen of the spine and pelvis. Trace retrolisthesis with stenosis at L2-3. Critical Value/emergent results were called by telephone at the time of interpretation on 08/17/2022 at 11:21 am to provider HALEY SAGE , who verbally acknowledged these results. IMPRESSION: 3.2 cm complex fluid and soft tissue area adjacent to the base of the cecum laterally in the right hemipelvis. This has separate from the ileocecal valve and the appendix is not seen as a separate structure. In light of the patient's history and appearance this very well could be sequela appendicitis. Possible small abscess. Enlarged heart with dilated IVC. Changes of chronic liver disease with fatty infiltration nodular contour. There is also hypertrophy of segment 3 of the liver. Please correlate with the history. Trace ascites. Subtle redundant loops of bowel with scattered stool. No obstruction. Tiny left pleural effusion Electronically Signed   By: Karen Kays M.D.   On: 08/17/2022 14:34      Darnell Level 08/18/2022  Patient ID: [name], female   DOB: 09-10-1932, 89 y.o.  MRN: [id number]

## 2022-08-19 ENCOUNTER — Encounter (HOSPITAL_COMMUNITY): Payer: Self-pay | Admitting: Surgery

## 2022-08-19 DIAGNOSIS — K3580 Unspecified acute appendicitis: Secondary | ICD-10-CM | POA: Diagnosis not present

## 2022-08-19 LAB — BASIC METABOLIC PANEL
Anion gap: 9 (ref 5–15)
BUN: 24 mg/dL — ABNORMAL HIGH (ref 8–23)
CO2: 30 mmol/L (ref 22–32)
Calcium: 9.2 mg/dL (ref 8.9–10.3)
Chloride: 100 mmol/L (ref 98–111)
Creatinine, Ser: 1.3 mg/dL — ABNORMAL HIGH (ref 0.44–1.00)
GFR, Estimated: 40 mL/min — ABNORMAL LOW (ref >60)
Glucose, Bld: 146 mg/dL — ABNORMAL HIGH (ref 70–99)
Potassium: 4.3 mmol/L (ref 3.5–5.1)
Sodium: 139 mmol/L (ref 135–145)

## 2022-08-19 LAB — CBC
HCT: 42.1 % (ref 36.0–46.0)
Hemoglobin: 13.9 g/dL (ref 12.0–15.0)
MCH: 37.9 pg — ABNORMAL HIGH (ref 26.0–34.0)
MCHC: 33 g/dL (ref 30.0–36.0)
MCV: 114.7 fL — ABNORMAL HIGH (ref 80.0–100.0)
Platelets: 507 10*3/uL — ABNORMAL HIGH (ref 150–400)
RBC: 3.67 MIL/uL — ABNORMAL LOW (ref 3.87–5.11)
RDW: 13.7 % (ref 11.5–15.5)
WBC: 19.6 10*3/uL — ABNORMAL HIGH (ref 4.0–10.5)
nRBC: 0 % (ref 0.0–0.2)

## 2022-08-19 MED ORDER — CIPROFLOXACIN HCL 500 MG PO TABS: 500.0000 mg | ORAL_TABLET | Freq: Two times a day (BID) | ORAL | 0 refills | Status: AC

## 2022-08-19 MED ORDER — ONDANSETRON HCL 4 MG PO TABS: 4.0000 mg | ORAL_TABLET | Freq: Four times a day (QID) | ORAL | 0 refills | Status: DC | PRN

## 2022-08-19 MED ORDER — ACETAMINOPHEN 500 MG PO TABS: 1000.0000 mg | ORAL_TABLET | Freq: Four times a day (QID) | ORAL | 0 refills | Status: DC | PRN

## 2022-08-19 MED ORDER — TRAMADOL HCL 50 MG PO TABS: 50.0000 mg | ORAL_TABLET | Freq: Four times a day (QID) | ORAL | 0 refills | Status: DC | PRN

## 2022-08-19 MED ORDER — METRONIDAZOLE 500 MG PO TABS: 500.0000 mg | ORAL_TABLET | Freq: Two times a day (BID) | ORAL | 0 refills | Status: AC

## 2022-08-19 NOTE — Discharge Summary (Signed)
Physician Discharge Summary   Patient: [name] MRN: [id number] DOB: 03-31-1933  Admit date:     08/17/2022  Discharge date: 08/19/22  Discharge Physician: Alba Cory   PCP: Gaspar Garbe, MD   Recommendations at discharge:    Needs CBC to follow WBC.  Needs Bmet to follow renal function.   Discharge Diagnoses: Principal Problem:   Acute appendicitis Active Problems:   Essential hypertension   Pulmonary hypertension, unspecified (HCC)   Thyroid disease   Hypokalemia   Polycythemia vera (HCC)  Resolved Problems:   * No resolved hospital problems. *  Hospital Course: 89 y.o. female with medical history significant for stage IIIa CKD, COPD, pulmonary hypertension, multinodular goiter with history of thyrotoxicosis, PCV who presents to the ED c/o abdominal pain that started 2 days PTA. Abdominal pain is periumbilical pain, radiates to the right lower quadrant. Patient went to see her primary care provider due to concerns for possible acute appendicitis and was referred to the ER for further evaluation. In the ED, labs showed leukocytosis with a white count of 13.6, lipase 73, platelet count 448, potassium 3.4, creatinine 1.05 CT scan of abdomen and pelvis shows 3.2 cm complex fluid and soft tissue area adjacent to the base of the cecum laterally in the right hemipelvis. In light of the patient's history and appearance this very well could be sequela appendicitis. Possible small abscess. General surgery was consulted by the ER physician who recommended admission to medicine for further management.   Patient underwent appendectomy 4/30. Today she is tolerating diet. Had BM. She has been clear by surgery for discharge.       Assessment and Plan: Acute appendicitis with small abscess in RLQ -Currently afebrile with leukocytosis -CT abd/pelvis shows 3.2 cm complex fluid and soft tissue area adjacent to the base of the cecum laterally in the right hemipelvis. In light of  the patient's history and appearance this very well could be sequela appendicitis. Possible small abscess -Cardiology cleared for surgery -Treated with Rocephin and Flagyl .  -Discharge on cipro and flagyl for 5 days.  -Per General Sx ok to discharge patient.    Polycythemia vera (HCC) Continue Hydrea   Thyroid disease Stable Continue methimazole   Pulmonary hypertension, unspecified Patient with a known history of pulmonary hypertension Continue torsemide and losartan   Essential hypertension Continue losartan and diltiazem Advised to resume Losartan tomorrow, she needs to check BP prior taking losartan.    CKD stage IIIa;  Cr relatively stable.  She had lab work done by PCP last week, cr was at 1.3        Consultants: General Sx Procedures performed: Appendectomy Disposition: Home Diet recommendation:  Discharge Diet Orders (From admission, onward)     Start     Ordered   08/19/22 0000  Diet - low sodium heart healthy        08/19/22 1043           Cardiac diet DISCHARGE MEDICATION: Allergies as of 08/19/2022       Reactions   Doxycycline Calcium Other (See Comments)   Per patient, "Intestines do not tolerate very well" = GI upset   Penicillin V Nausea Only, Rash        Medication List     TAKE these medications    acetaminophen 500 MG tablet Commonly known as: TYLENOL Take 2 tablets (1,000 mg total) by mouth every 6 (six) hours as needed.   aspirin 81 MG tablet Take 81 mg  by mouth daily at 12 noon.   b complex vitamins capsule Take 1 capsule by mouth daily.   calcium-vitamin D 250-100 MG-UNIT tablet Take 1 tablet by mouth daily.   cetirizine 10 MG tablet Commonly known as: ZYRTEC Take 10 mg by mouth daily as needed for allergies or rhinitis.   ciprofloxacin 500 MG tablet Commonly known as: Cipro Take 1 tablet (500 mg total) by mouth 2 (two) times daily for 4 days.   diltiazem 180 MG 24 hr capsule Commonly known as: DILACOR XR Take  180 mg by mouth in the morning.   hydroxyurea 500 MG capsule Commonly known as: HYDREA Take 1 capsule (500 mg total) by mouth daily. May take with food to minimize GI side effects.   losartan 100 MG tablet Commonly known as: COZAAR TAKE 1 TABLET BY MOUTH DAILY What changed: when to take this   methimazole 5 MG tablet Commonly known as: TAPAZOLE Take 5 mg by mouth in the morning.   metroNIDAZOLE 500 MG tablet Commonly known as: Flagyl Take 1 tablet (500 mg total) by mouth 2 (two) times daily for 4 days.   ondansetron 4 MG tablet Commonly known as: ZOFRAN Take 1 tablet (4 mg total) by mouth every 6 (six) hours as needed for nausea.   potassium chloride SA 20 MEQ tablet Commonly known as: KLOR-CON M TAKE 1 TABLET BY MOUTH TWICE DAILY What changed: when to take this   psyllium 58.6 % packet Commonly known as: METAMUCIL Take 1 packet by mouth daily.   timolol 0.5 % ophthalmic solution Commonly known as: TIMOPTIC Place 1 drop into both eyes in the morning.   torsemide 20 MG tablet Commonly known as: DEMADEX Take 1 tablet (20 mg total) by mouth 2 (two) times daily. What changed:  when to take this additional instructions   traMADol 50 MG tablet Commonly known as: ULTRAM Take 1 tablet (50 mg total) by mouth every 6 (six) hours as needed for moderate pain.        Follow-up Information     Maczis, Hedda Slade, [place]. Go on 09/15/2022.   Specialty: General Surgery Why: 09/15/22 at 10:45 am. Please arrive 30 minutes early to complete check in, and bring photo ID and insurance card. Contact information: [address] [place] [address] [facility] [place] [address] [phone]         [name], [name], MD Follow up in 1 week(s).   Specialty: Internal Medicine Contact information: [address] [place] [address] [phone]                Discharge Exam: Ceasar Mons Weights   08/18/22 1016  Weight: 45 kg   General;  NAD  Condition at discharge: stable  The results of significant diagnostics from this hospitalization (including imaging, microbiology, ancillary and laboratory) are listed below for reference.   Imaging Studies: CT ABDOMEN PELVIS W CONTRAST  Result Date: 08/17/2022 CLINICAL DATA:  Right lower quadrant abdominal pain. Elevated white blood cell count EXAM: CT ABDOMEN AND PELVIS WITH CONTRAST TECHNIQUE: Multidetector CT imaging of the abdomen and pelvis was performed using the standard protocol following bolus administration of intravenous contrast. RADIATION DOSE REDUCTION: This exam was performed according to the departmental dose-optimization program which includes automated exposure control, adjustment of the mA and/or kV according to patient size and/or use of iterative reconstruction technique. CONTRAST:  OMNIPAQUE IOHEXOL 300 MG/ML  SOLN COMPARISON:  None FINDINGS: Lower chest: Heart is enlarged. Small pericardial effusion. Trace left-sided pleural  effusion. There are some reticular changes of the bases with areas of bronchiectasis. Hepatobiliary: Mild fatty liver infiltration. Slight asymmetric enlargement of the left hepatic lobe with slightly nodular contours. Please correlate for any evidence of underlying chronic liver disease. Note is made of a distended IVC. No space-occupying liver lesion. Patent portal vein. Pancreas: Mild pancreatic atrophy with diffuse mildly enlarged pancreatic duct. No obvious mass initially there was a filling defect within the splenic vein with this does not persist on delayed imaging and may have been flow related. Spleen: Spleen has a cephalocaudal length of 11.1 cm. Nonenlarged. Preserved enhancement. Adrenals/Urinary Tract: Adrenal glands are preserved. Right kidney is slightly malrotated. No enhancing renal mass or collecting system dilatation. Extrarenal pelvis noted to each kidney. Preserved contours of the urinary bladder. Stomach/Bowel: Stomach is  nondilated. On this non oral contrast exam the small bowel is nondilated. Large bowel has some scattered stool. No bowel obstruction. Redundant loops identified. There is some stranding identified in the area of the ileocecal valve. Small area of fluid and thickening seen lateral to the cecum in the anterior right hemipelvis. This appears separate from the ileocecal valve on coronal imaging. This area measures proximally 2.8 by 2.1 by 3.2 cm. This is in the expected location of the appendix although the appendix itself is poorly defined on this examination. With patient's clinical history this very well could be small abscess formation related to appendicitis. There is in principle differential based on clinical presentation and appearance. Vascular/Lymphatic: Normal caliber aorta and IVC. Scattered vascular calcifications. No specific abnormal lymph node enlargement. Reproductive: Uterus and bilateral adnexa are unremarkable. Other: Anasarca. No free air. Mild mesenteric haziness. Trace perihepatic ascites. Musculoskeletal: Curvature of the spine. Scattered degenerative changes seen of the spine and pelvis. Trace retrolisthesis with stenosis at L2-3. Critical Value/emergent results were called by telephone at the time of interpretation on 08/17/2022 at 11:21 am to provider HALEY SAGE , who verbally acknowledged these results. IMPRESSION: 3.2 cm complex fluid and soft tissue area adjacent to the base of the cecum laterally in the right hemipelvis. This has separate from the ileocecal valve and the appendix is not seen as a separate structure. In light of the patient's history and appearance this very well could be sequela appendicitis. Possible small abscess. Enlarged heart with dilated IVC. Changes of chronic liver disease with fatty infiltration nodular contour. There is also hypertrophy of segment 3 of the liver. Please correlate with the history. Trace ascites. Subtle redundant loops of bowel with scattered  stool. No obstruction. Tiny left pleural effusion Electronically Signed   By: Karen Kays M.D.   On: 08/17/2022 14:34   PCV ECHOCARDIOGRAM COMPLETE  Result Date: 07/28/2022 Echocardiogram 07/27/2022: Left ventricle cavity is normal in size and wall thickness. Normal global wall motion. Normal LV systolic function with EF 69%. Normal diastolic filling pattern. Left atrial cavity is mildly dilated. Right atrial cavity is severely dilated. Right ventricle cavity is mildly dilated. Normal right ventricular function. Structurally normal trileaflet aortic valve.  Mild (Grade I) aortic regurgitation. Mild to moderate mitral regurgitation. Moderate to severe tricuspid regurgitation. Estimated pulmonary artery systolic pressure 60 mmHg. Mild pulmonic regurgitation. Trace pericardial effusion. Previous study on 01/22/2022 estimated PASP at 51 mmHg.    Microbiology: No results found for this or any previous visit.  Labs: CBC: Recent Labs  Lab 08/17/22 1230 08/19/22 0456  WBC 13.6* 19.6*  NEUTROABS 10.9*  --   HGB 14.2 13.9  HCT 43.0 42.1  MCV 115.6* 114.7*  PLT 448* 507*   Basic Metabolic Panel: Recent Labs  Lab 08/17/22 1230 08/18/22 0506 08/19/22 0456  NA 138 139 139  K 3.4* 3.7 4.3  CL 99 101 100  CO2 30 30 30   GLUCOSE 87 94 146*  BUN 27* 20 24*  CREATININE 1.05* 0.99 1.30*  CALCIUM 9.1 8.8* 9.2  MG 2.4  --   --    Liver Function Tests: Recent Labs  Lab 08/17/22 1230 08/18/22 0506  AST 28 24  ALT 23 18  ALKPHOS 80 73  BILITOT 1.1 0.9  PROT 7.2 6.2*  ALBUMIN 3.8 3.1*   CBG: No results for input(s): "GLUCAP" in the last 168 hours.  Discharge time spent: greater than 30 minutes.  Signed: Alba Cory, MD [facility] 08/19/2022

## 2022-08-19 NOTE — Progress Notes (Signed)
1 Day Post-Op  Subjective: Looks great.  Eating, walking, and voiding well with good pain control with Tylenol.  Objective: Vital signs in last 24 hours: Temp:  [97.4 F (36.3 C)-98.8 F (37.1 C)] 97.6 F (36.4 C) (05/01 0913) Pulse Rate:  [63-73] 64 (05/01 0913) Resp:  [10-20] 18 (05/01 0913) BP: (122-172)/(50-64) 122/55 (05/01 0913) SpO2:  [93 %-100 %] 98 % (05/01 0913) Weight:  [45 kg] 45 kg (04/30 1016) Last BM Date : 08/17/22  Intake/Output from previous day: 04/30 0701 - 05/01 0700 In: 1627.3 [P.O.:660; I.V.:567.3; IV Piggyback:400.1] Out: 1405 [Urine:1400; Blood:5] Intake/Output this shift: No intake/output data recorded.  PE: Abd: soft, minimally tender, +BS, ND, incisions c/d/i  Lab Results:  Recent Labs    08/17/22 1230 08/19/22 0456  WBC 13.6* 19.6*  HGB 14.2 13.9  HCT 43.0 42.1  PLT 448* 507*   BMET Recent Labs    08/18/22 0506 08/19/22 0456  NA 139 139  K 3.7 4.3  CL 101 100  CO2 30 30  GLUCOSE 94 146*  BUN 20 24*  CREATININE 0.99 1.30*  CALCIUM 8.8* 9.2   PT/INR No results for input(s): "LABPROT", "INR" in the last 72 hours. CMP     Component Value Date/Time   NA 139 08/19/2022 0456   K 4.3 08/19/2022 0456   CL 100 08/19/2022 0456   CO2 30 08/19/2022 0456   GLUCOSE 146 (H) 08/19/2022 0456   BUN 24 (H) 08/19/2022 0456   CREATININE 1.30 (H) 08/19/2022 0456   CREATININE 0.91 05/04/2022 0933   CALCIUM 9.2 08/19/2022 0456   PROT 6.2 (L) 08/18/2022 0506   ALBUMIN 3.1 (L) 08/18/2022 0506   AST 24 08/18/2022 0506   AST 25 05/04/2022 0933   ALT 18 08/18/2022 0506   ALT 21 05/04/2022 0933   ALKPHOS 73 08/18/2022 0506   BILITOT 0.9 08/18/2022 0506   BILITOT 0.7 05/04/2022 0933   GFRNONAA 40 (L) 08/19/2022 0456   GFRNONAA >60 05/04/2022 0933   Lipase     Component Value Date/Time   LIPASE 73 (H) 08/17/2022 1230       Studies/Results: CT ABDOMEN PELVIS W CONTRAST  Result Date: 08/17/2022 CLINICAL DATA:  Right lower  quadrant abdominal pain. Elevated white blood cell count EXAM: CT ABDOMEN AND PELVIS WITH CONTRAST TECHNIQUE: Multidetector CT imaging of the abdomen and pelvis was performed using the standard protocol following bolus administration of intravenous contrast. RADIATION DOSE REDUCTION: This exam was performed according to the departmental dose-optimization program which includes automated exposure control, adjustment of the mA and/or kV according to patient size and/or use of iterative reconstruction technique. CONTRAST:  OMNIPAQUE IOHEXOL 300 MG/ML  SOLN COMPARISON:  None FINDINGS: Lower chest: Heart is enlarged. Small pericardial effusion. Trace left-sided pleural effusion. There are some reticular changes of the bases with areas of bronchiectasis. Hepatobiliary: Mild fatty liver infiltration. Slight asymmetric enlargement of the left hepatic lobe with slightly nodular contours. Please correlate for any evidence of underlying chronic liver disease. Note is made of a distended IVC. No space-occupying liver lesion. Patent portal vein. Pancreas: Mild pancreatic atrophy with diffuse mildly enlarged pancreatic duct. No obvious mass initially there was a filling defect within the splenic vein with this does not persist on delayed imaging and may have been flow related. Spleen: Spleen has a cephalocaudal length of 11.1 cm. Nonenlarged. Preserved enhancement. Adrenals/Urinary Tract: Adrenal glands are preserved. Right kidney is slightly malrotated. No enhancing renal mass or collecting system dilatation. Extrarenal pelvis noted  to each kidney. Preserved contours of the urinary bladder. Stomach/Bowel: Stomach is nondilated. On this non oral contrast exam the small bowel is nondilated. Large bowel has some scattered stool. No bowel obstruction. Redundant loops identified. There is some stranding identified in the area of the ileocecal valve. Small area of fluid and thickening seen lateral to the cecum in the anterior  right hemipelvis. This appears separate from the ileocecal valve on coronal imaging. This area measures proximally 2.8 by 2.1 by 3.2 cm. This is in the expected location of the appendix although the appendix itself is poorly defined on this examination. With patient's clinical history this very well could be small abscess formation related to appendicitis. There is in principle differential based on clinical presentation and appearance. Vascular/Lymphatic: Normal caliber aorta and IVC. Scattered vascular calcifications. No specific abnormal lymph node enlargement. Reproductive: Uterus and bilateral adnexa are unremarkable. Other: Anasarca. No free air. Mild mesenteric haziness. Trace perihepatic ascites. Musculoskeletal: Curvature of the spine. Scattered degenerative changes seen of the spine and pelvis. Trace retrolisthesis with stenosis at L2-3. Critical Value/emergent results were called by telephone at the time of interpretation on 08/17/2022 at 11:21 am to provider HALEY SAGE , who verbally acknowledged these results. IMPRESSION: 3.2 cm complex fluid and soft tissue area adjacent to the base of the cecum laterally in the right hemipelvis. This has separate from the ileocecal valve and the appendix is not seen as a separate structure. In light of the patient's history and appearance this very well could be sequela appendicitis. Possible small abscess. Enlarged heart with dilated IVC. Changes of chronic liver disease with fatty infiltration nodular contour. There is also hypertrophy of segment 3 of the liver. Please correlate with the history. Trace ascites. Subtle redundant loops of bowel with scattered stool. No obstruction. Tiny left pleural effusion Electronically Signed   By: Karen Kays M.D.   On: 08/17/2022 14:34    Anti-infectives: Anti-infectives (From admission, onward)    Start     Dose/Rate Route Frequency Ordered Stop   08/19/22 0000  ciprofloxacin (CIPRO) 500 MG tablet        500 mg Oral 2  times daily 08/19/22 1009 08/23/22 2359   08/19/22 0000  metroNIDAZOLE (FLAGYL) 500 MG tablet        500 mg Oral 2 times daily 08/19/22 1009 08/23/22 2359   08/18/22 1600  cefTRIAXone (ROCEPHIN) 2 g in sodium chloride 0.9 % 100 mL IVPB       See Hyperspace for full Linked Orders Report.   2 g 200 mL/hr over 30 Minutes Intravenous Every 24 hours 08/17/22 1650     08/18/22 0500  metroNIDAZOLE (FLAGYL) IVPB 500 mg       See Hyperspace for full Linked Orders Report.   500 mg 100 mL/hr over 60 Minutes Intravenous Every 12 hours 08/17/22 1650     08/17/22 1445  cefTRIAXone (ROCEPHIN) 2 g in sodium chloride 0.9 % 100 mL IVPB       See Hyperspace for full Linked Orders Report.   2 g 200 mL/hr over 30 Minutes Intravenous  Once 08/17/22 1435 08/17/22 1629   08/17/22 1445  metroNIDAZOLE (FLAGYL) IVPB 500 mg       See Hyperspace for full Linked Orders Report.   500 mg 100 mL/hr over 60 Minutes Intravenous  Once 08/17/22 1435 08/17/22 1730        Assessment/Plan POD 1, s/p lap appy for perforated appendicitis, Dr. Gerrit Friends 4/30 -doing great -tolerating solid diet -pain well  controlled -stable for DC home -follow up arranged as well as medications sent to pharmacy for 5 days of abx therapy total.    FEN - regular VTE - SCDs ID - Rocephin/Flagy, Cipro/Flagyl at discharge    LOS: 2 days    Letha Cape , [facility] 08/19/2022, 10:10 AM Please see Amion for pager number during day hours 7:00am-4:30pm or 7:00am -11:30am on weekends

## 2022-08-19 NOTE — Progress Notes (Signed)
Reviewed written d/c instructions w pt and her husband and all questions answered. They both verbalized understanding. D/C via w/c w all belongings in stable condition. 

## 2022-08-19 NOTE — Anesthesia Postprocedure Evaluation (Signed)
Anesthesia Post Note  Patient: [name]  Procedure(s) Performed: APPENDECTOMY LAPAROSCOPIC     Patient location during evaluation: PACU Anesthesia Type: General Level of consciousness: sedated and patient cooperative Pain management: pain level controlled Vital Signs Assessment: post-procedure vital signs reviewed and stable Respiratory status: spontaneous breathing Cardiovascular status: stable Anesthetic complications: no   No notable events documented.  Last Vitals:  Vitals:   08/19/22 0512 08/19/22 0913  BP: (!) 143/64 (!) 122/55  Pulse: 63 64  Resp: 18 18  Temp: 36.4 C 36.4 C  SpO2: 95% 98%    Last Pain:  Vitals:   08/19/22 0913  TempSrc: Oral  PainSc:                  Lewie Loron

## 2022-08-19 NOTE — Plan of Care (Signed)

## 2022-08-24 LAB — SURGICAL PATHOLOGY

## 2022-08-27 ENCOUNTER — Other Ambulatory Visit: Payer: Self-pay

## 2022-08-27 DIAGNOSIS — I129 Hypertensive chronic kidney disease with stage 1 through stage 4 chronic kidney disease, or unspecified chronic kidney disease: Secondary | ICD-10-CM | POA: Diagnosis not present

## 2022-08-27 DIAGNOSIS — J449 Chronic obstructive pulmonary disease, unspecified: Secondary | ICD-10-CM | POA: Diagnosis not present

## 2022-08-27 DIAGNOSIS — K3533 Acute appendicitis with perforation and localized peritonitis, with abscess: Secondary | ICD-10-CM | POA: Diagnosis not present

## 2022-08-27 DIAGNOSIS — D471 Chronic myeloproliferative disease: Secondary | ICD-10-CM

## 2022-08-27 DIAGNOSIS — B370 Candidal stomatitis: Secondary | ICD-10-CM | POA: Diagnosis not present

## 2022-08-27 DIAGNOSIS — N1831 Chronic kidney disease, stage 3a: Secondary | ICD-10-CM | POA: Diagnosis not present

## 2022-08-27 DIAGNOSIS — I272 Pulmonary hypertension, unspecified: Secondary | ICD-10-CM | POA: Diagnosis not present

## 2022-08-27 DIAGNOSIS — D45 Polycythemia vera: Secondary | ICD-10-CM | POA: Diagnosis not present

## 2022-08-28 ENCOUNTER — Ambulatory Visit
Admission: RE | Admit: 2022-08-28 | Discharge: 2022-08-28 | Disposition: A | Discharge status: Home / Self Care | Payer: Medicare PPO | Source: Ambulatory Visit | Attending: Internal Medicine | Admitting: Internal Medicine

## 2022-08-28 ENCOUNTER — Other Ambulatory Visit: Payer: Self-pay | Admitting: Internal Medicine

## 2022-08-28 DIAGNOSIS — N6310 Unspecified lump in the right breast, unspecified quadrant: Secondary | ICD-10-CM

## 2022-08-28 DIAGNOSIS — N63 Unspecified lump in unspecified breast: Secondary | ICD-10-CM | POA: Diagnosis not present

## 2022-08-31 ENCOUNTER — Other Ambulatory Visit (HOSPITAL_COMMUNITY): Payer: Self-pay

## 2022-09-02 ENCOUNTER — Other Ambulatory Visit: Payer: Self-pay

## 2022-09-02 ENCOUNTER — Inpatient Hospital Stay: Payer: Medicare PPO | Admitting: Hematology

## 2022-09-02 ENCOUNTER — Inpatient Hospital Stay: Payer: Medicare PPO | Attending: Hematology

## 2022-09-02 VITALS — BP 143/55 | HR 61 | Temp 97.5°F | Resp 17 | Ht 60.0 in | Wt 96.2 lb

## 2022-09-02 DIAGNOSIS — D471 Chronic myeloproliferative disease: Secondary | ICD-10-CM | POA: Diagnosis not present

## 2022-09-02 DIAGNOSIS — D45 Polycythemia vera: Secondary | ICD-10-CM | POA: Diagnosis not present

## 2022-09-02 DIAGNOSIS — Z1589 Genetic susceptibility to other disease: Secondary | ICD-10-CM | POA: Diagnosis not present

## 2022-09-02 LAB — CMP (CANCER CENTER ONLY)
ALT: 22 U/L (ref 0–44)
AST: 26 U/L (ref 15–41)
Albumin: 3.9 g/dL (ref 3.5–5.0)
Alkaline Phosphatase: 70 U/L (ref 38–126)
Anion gap: 5 (ref 5–15)
BUN: 32 mg/dL — ABNORMAL HIGH (ref 8–23)
CO2: 33 mmol/L — ABNORMAL HIGH (ref 22–32)
Calcium: 9.1 mg/dL (ref 8.9–10.3)
Chloride: 103 mmol/L (ref 98–111)
Creatinine: 1.26 mg/dL — ABNORMAL HIGH (ref 0.44–1.00)
GFR, Estimated: 41 mL/min — ABNORMAL LOW (ref >60)
Glucose, Bld: 113 mg/dL — ABNORMAL HIGH (ref 70–99)
Potassium: 3.4 mmol/L — ABNORMAL LOW (ref 3.5–5.1)
Sodium: 141 mmol/L (ref 135–145)
Total Bilirubin: 0.6 mg/dL (ref 0.3–1.2)
Total Protein: 6.8 g/dL (ref 6.5–8.1)

## 2022-09-02 LAB — CBC WITH DIFFERENTIAL (CANCER CENTER ONLY)
Abs Immature Granulocytes: 0.04 10*3/uL (ref 0.00–0.07)
Basophils Absolute: 0.1 10*3/uL (ref 0.0–0.1)
Basophils Relative: 1 %
Eosinophils Absolute: 0.2 10*3/uL (ref 0.0–0.5)
Eosinophils Relative: 2 %
HCT: 39.9 % (ref 36.0–46.0)
Hemoglobin: 13.5 g/dL (ref 12.0–15.0)
Immature Granulocytes: 1 %
Lymphocytes Relative: 13 %
Lymphs Abs: 1.1 10*3/uL (ref 0.7–4.0)
MCH: 38.8 pg — ABNORMAL HIGH (ref 26.0–34.0)
MCHC: 33.8 g/dL (ref 30.0–36.0)
MCV: 114.7 fL — ABNORMAL HIGH (ref 80.0–100.0)
Monocytes Absolute: 0.7 10*3/uL (ref 0.1–1.0)
Monocytes Relative: 8 %
Neutro Abs: 6.5 10*3/uL (ref 1.7–7.7)
Neutrophils Relative %: 75 %
Platelet Count: 491 10*3/uL — ABNORMAL HIGH (ref 150–400)
RBC: 3.48 MIL/uL — ABNORMAL LOW (ref 3.87–5.11)
RDW: 14.2 % (ref 11.5–15.5)
WBC Count: 8.6 10*3/uL (ref 4.0–10.5)
nRBC: 0 % (ref 0.0–0.2)

## 2022-09-02 LAB — LACTATE DEHYDROGENASE: LDH: 186 U/L (ref 98–192)

## 2022-09-02 NOTE — Progress Notes (Signed)
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   Patient was last seen by me on 05/04/2022 and had recovered from an upper respiratory infection. She reported nausea and an occasional cough.  Today, she reports that her incisions from recent microscopic appendectomy are healing. Patient has never needed to take any tramadol. She did take Tylenol for pain management. Patient has been off of Hydroxyurea for 3 days while in the hospital.   Patient endorses Bowel movement slightly more frequently. Her bowel movements were initialy 3x day and are now 2x day. She consumes mildly softer normal foods.  She reports that due to Cipro and flagil she endorses a burning sensation in her tongue. She has noticed she has lost some weight. Patient has lost 3 pounds since 08/18/2022.   Patient's PCP ordered fungal and mouth wash but patient has not been able to retreive the medication due to delay of arrival to pharmacy. Patient does use biotin mouth wash regularly.  Patient reports that she noticed a breast lump 1.5 years. She denies any discomfort in the area. Patient has received an [place] and mammogram for further evaluation. She notes that she is planning to receive a biopsy soon.   She denies any fever, chills, or night sweats. Patient's BP reading in clinic today is 143/55.  MEDICAL HISTORY:  Past Medical History:  Diagnosis Date   Osteopenia    Thyroid disease    Varicose veins VENOUS INSUFFICIENCY SLOW HEALING OF WOUNDS [name] TO LOWER EXTEMETIES   Venous stasis dermatitis      SURGICAL HISTORY: Past Surgical History:  Procedure Laterality Date   BREAST CYST ASPIRATION Left    EYE SURGERY     LAPAROSCOPIC APPENDECTOMY N/A 08/18/2022   Procedure: APPENDECTOMY LAPAROSCOPIC;  Surgeon: Darnell Level, MD;  Location: [facility];  Service: General;  Laterality: N/A;   TONSILLECTOMY      SOCIAL HISTORY: Social History   Socioeconomic History   Marital status: Married    Spouse name: Not on file   Number of children: 2   Years of education: Not on file   Highest education level: Not on file  Occupational History   Not on file  Tobacco Use   Smoking status: Never   Smokeless tobacco: Never  Vaping Use   Vaping Use: Never used  Substance and Sexual Activity   Alcohol use: No   Drug use: Never   Sexual activity: Not on file  Other Topics Concern   Not on file  Social History Narrative   Not on file   Social Determinants of Health   Financial Resource Strain: Not on file  Food Insecurity: No Food Insecurity (08/18/2022)   Hunger Vital Sign    Worried About Running Out of Food in the Last Year: Never true    Ran Out of Food in the Last Year: Never true  Transportation Needs: No Transportation Needs (08/18/2022)   PRAPARE - Administrator, Civil Service (Medical): No    Lack of Transportation (Non-Medical): No  Physical Activity: Not on file  Stress: Not on file  Social Connections: Not on file  Intimate Partner Violence: Not At Risk (08/18/2022)   Humiliation, Afraid, Rape, and Kick questionnaire    Fear of Current or Ex-Partner: No    Emotionally Abused: No    Physically Abused: No    Sexually Abused: No    FAMILY HISTORY: Family History  Problem Relation Age of Onset   Heart disease Father    Cancer Son    Cancer Maternal Aunt    Cancer Paternal Uncle    Cancer Maternal Grandfather    Breast cancer Neg Hx     ALLERGIES:  is allergic to doxycycline calcium and penicillin v.  MEDICATIONS:  Current Outpatient Medications   Medication Sig Dispense Refill   acetaminophen (TYLENOL) 500 MG tablet Take 2 tablets (1,000 mg total) by mouth every 6 (six) hours as needed. 30 tablet 0   aspirin 81 MG tablet Take 81 mg by mouth daily at 12 noon.     b complex vitamins capsule Take 1 capsule by mouth daily.     calcium-vitamin D 250-100 MG-UNIT per tablet Take 1 tablet by mouth daily.     cetirizine (ZYRTEC) 10 MG tablet Take 10 mg by mouth daily as needed for allergies or rhinitis.     diltiazem (DILACOR XR) 180 MG 24 hr capsule Take 180 mg by mouth in the morning.     hydroxyurea (HYDREA) 500 MG capsule Take 1 capsule (500 mg total) by mouth daily. May take with food to minimize GI side effects. 30 capsule 1   losartan (COZAAR) 100 MG tablet TAKE 1 TABLET BY MOUTH DAILY (Patient taking differently: Take 100 mg by mouth at bedtime.) 90 tablet 3   methimazole (TAPAZOLE) 5 MG tablet Take 5 mg by mouth in the morning.     ondansetron (ZOFRAN) 4 MG tablet Take 1 tablet (4 mg total) by mouth every 6 (six) hours as needed for nausea. 20 tablet 0   potassium chloride SA (KLOR-CON M) 20 MEQ tablet TAKE 1 TABLET BY MOUTH TWICE DAILY (Patient taking differently: Take 20 mEq by mouth daily at 12 noon.) 120 tablet 2   psyllium (METAMUCIL) 58.6 % packet Take 1 packet by mouth daily.     timolol (TIMOPTIC) 0.5 % ophthalmic solution Place 1 drop into both eyes in the morning.     torsemide (DEMADEX) 20 MG tablet Take 1 tablet (20 mg total) by mouth 2 (two) times daily. (Patient taking differently: Take 20 mg by mouth See admin instructions. Take 20 mg by mouth in the morning and at 4 PM daily) 120 tablet 3   traMADol (ULTRAM) 50 MG tablet Take 1 tablet (50 mg total) by mouth every 6 (six) hours as needed for moderate pain. 5 tablet 0   No current facility-administered medications for this visit.    REVIEW OF SYSTEMS:    10 Point review of Systems was done is negative except as noted above.   PHYSICAL EXAMINATION: ECOG PERFORMANCE  STATUS: 2  . Vitals:   09/02/22 1019  BP: (!) 143/55  Pulse: 61  Resp: 17  Temp: (!) 97.5 F (36.4 C)  SpO2: 100%    Filed Weights   09/02/22 1019  Weight: 96 lb 3.2 oz (43.6 kg)   .Body mass index is 18.79 kg/m. GENERAL:alert, in no acute distress and comfortable SKIN: no acute rashes, no significant lesions EYES: conjunctiva are pink and non-injected, sclera anicteric OROPHARYNX: MMM, no exudates, no oropharyngeal erythema or ulceration NECK: supple, no JVD LYMPH:  no palpable lymphadenopathy in the cervical, axillary  or inguinal regions LUNGS: clear to auscultation b/l with normal respiratory effort HEART: regular rate & rhythm ABDOMEN:  normoactive bowel sounds , non tender, not distended. Extremity: no pedal edema PSYCH: alert & oriented x 3 with fluent speech NEURO: no focal motor/sensory deficits   LABORATORY DATA:  I have reviewed the data as listed     Latest Ref Rng & Units 09/02/2022    9:54 AM 08/19/2022    4:56 AM 08/17/2022   12:30 PM  CBC  WBC 4.0 - 10.5 K/uL 8.6  19.6  13.6   Hemoglobin 12.0 - 15.0 g/dL 16.1  09.6  04.5   Hematocrit 36.0 - 46.0 % 39.9  42.1  43.0   Platelets 150 - 400 K/uL 491  507  448     .    Latest Ref Rng & Units 09/02/2022    9:54 AM 08/19/2022    4:56 AM 08/18/2022    5:06 AM  CMP  Glucose 70 - 99 mg/dL 409  811  94   BUN 8 - 23 mg/dL 32  24  20   Creatinine 0.44 - 1.00 mg/dL 9.14  7.82  9.56   Sodium 135 - 145 mmol/L 141  139  139   Potassium 3.5 - 5.1 mmol/L 3.4  4.3  3.7   Chloride 98 - 111 mmol/L 103  100  101   CO2 22 - 32 mmol/L 33  30  30   Calcium 8.9 - 10.3 mg/dL 9.1  9.2  8.8   Total Protein 6.5 - 8.1 g/dL 6.8   6.2   Total Bilirubin 0.3 - 1.2 mg/dL 0.6   0.9   Alkaline Phos 38 - 126 U/L 70   73   AST 15 - 41 U/L 26   24   ALT 0 - 44 U/L 22   18    . Lab Results  Component Value Date   LDH 186 09/02/2022      RADIOGRAPHIC STUDIES: I have personally reviewed the radiological images as listed and  agreed with the findings in the report. MM 3D DIAGNOSTIC MAMMOGRAM BILATERAL BREAST  Result Date: 08/28/2022 CLINICAL DATA:  Patient presents with a small palpable right breast lump. EXAM: DIGITAL DIAGNOSTIC BILATERAL MAMMOGRAM WITH TOMOSYNTHESIS; ULTRASOUND RIGHT BREAST LIMITED TECHNIQUE: Bilateral digital diagnostic mammography and breast tomosynthesis was performed.; Targeted ultrasound examination of the right breast was performed COMPARISON:  Previous exam(s). ACR Breast Density Category b: There are scattered areas of fibroglandular density. FINDINGS: There is a small ill-defined mass in the right breast, upper outer quadrant, corresponding to the palpable abnormality. A well-defined oval circumscribed mass is noted in the immediate retroareolar right breast which is stable from prior exams consistent with a benign etiology. There are no other breast masses, no areas of architectural distortion and no suspicious calcifications. On physical exam, small mobile palpable mass noted in the upper outer right breast. Targeted ultrasound is performed, showing a small hypoechoic mass in the right breast at 11 o'clock, 3 cm the nipple, with ill-defined margins and no convincing internal blood flow on color Doppler analysis. The mass measures 5 x 4 x 4 mm. Imaging of the right axilla shows no enlarged or abnormal lymph nodes. IMPRESSION: 1. 5 mm indeterminate mass in the right breast at 11 o'clock, 3 cm the nipple. Tissue sampling is recommended. RECOMMENDATION: 1. Ultrasound-guided core needle biopsy of the small right breast mass at 11 o'clock. This procedure was scheduled prior to the patient being [facility] the  [facility]. I have discussed the findings and recommendations with the patient. If applicable, a reminder letter will be sent to the patient regarding the next appointment. BI-RADS CATEGORY  4: Suspicious. Electronically Signed   By: Amie Portland M.D.   On: 08/28/2022 15:43  [place] LIMITED  ULTRASOUND INCLUDING AXILLA RIGHT BREAST  Result Date: 08/28/2022 CLINICAL DATA:  Patient presents with a small palpable right breast lump. EXAM: DIGITAL DIAGNOSTIC BILATERAL MAMMOGRAM WITH TOMOSYNTHESIS; ULTRASOUND RIGHT BREAST LIMITED TECHNIQUE: Bilateral digital diagnostic mammography and breast tomosynthesis was performed.; Targeted ultrasound examination of the right breast was performed COMPARISON:  Previous exam(s). ACR Breast Density Category b: There are scattered areas of fibroglandular density. FINDINGS: There is a small ill-defined mass in the right breast, upper outer quadrant, corresponding to the palpable abnormality. A well-defined oval circumscribed mass is noted in the immediate retroareolar right breast which is stable from prior exams consistent with a benign etiology. There are no other breast masses, no areas of architectural distortion and no suspicious calcifications. On physical exam, small mobile palpable mass noted in the upper outer right breast. Targeted ultrasound is performed, showing a small hypoechoic mass in the right breast at 11 o'clock, 3 cm the nipple, with ill-defined margins and no convincing internal blood flow on color Doppler analysis. The mass measures 5 x 4 x 4 mm. Imaging of the right axilla shows no enlarged or abnormal lymph nodes. IMPRESSION: 1. 5 mm indeterminate mass in the right breast at 11 o'clock, 3 cm the nipple. Tissue sampling is recommended. RECOMMENDATION: 1. Ultrasound-guided core needle biopsy of the small right breast mass at 11 o'clock. This procedure was scheduled prior to the patient being discharged from the [facility]. I have discussed the findings and recommendations with the patient. If applicable, a reminder letter will be sent to the patient regarding the next appointment. BI-RADS CATEGORY  4: Suspicious. Electronically Signed   By: Amie Portland M.D.   On: 08/28/2022 15:43  CT ABDOMEN PELVIS W CONTRAST  Result Date: 08/17/2022 CLINICAL  DATA:  Right lower quadrant abdominal pain. Elevated white blood cell count EXAM: CT ABDOMEN AND PELVIS WITH CONTRAST TECHNIQUE: Multidetector CT imaging of the abdomen and pelvis was performed using the standard protocol following bolus administration of intravenous contrast. RADIATION DOSE REDUCTION: This exam was performed according to the departmental dose-optimization program which includes automated exposure control, adjustment of the mA and/or kV according to patient size and/or use of iterative reconstruction technique. CONTRAST:  OMNIPAQUE IOHEXOL 300 MG/ML  SOLN COMPARISON:  None FINDINGS: Lower chest: Heart is enlarged. Small pericardial effusion. Trace left-sided pleural effusion. There are some reticular changes of the bases with areas of bronchiectasis. Hepatobiliary: Mild fatty liver infiltration. Slight asymmetric enlargement of the left hepatic lobe with slightly nodular contours. Please correlate for any evidence of underlying chronic liver disease. Note is made of a distended IVC. No space-occupying liver lesion. Patent portal vein. Pancreas: Mild pancreatic atrophy with diffuse mildly enlarged pancreatic duct. No obvious mass initially there was a filling defect within the splenic vein with this does not persist on delayed imaging and may have been flow related. Spleen: Spleen has a cephalocaudal length of 11.1 cm. Nonenlarged. Preserved enhancement. Adrenals/Urinary Tract: Adrenal glands are preserved. Right kidney is slightly malrotated. No enhancing renal mass or collecting system dilatation. Extrarenal pelvis noted to each kidney. Preserved contours of the urinary bladder. Stomach/Bowel: Stomach is nondilated. On this non oral contrast exam the small bowel is nondilated. Large bowel has some  scattered stool. No bowel obstruction. Redundant loops identified. There is some stranding identified in the area of the ileocecal valve. Small area of fluid and thickening seen lateral to the cecum  in the anterior right hemipelvis. This appears separate from the ileocecal valve on coronal imaging. This area measures proximally 2.8 by 2.1 by 3.2 cm. This is in the expected location of the appendix although the appendix itself is poorly defined on this examination. With patient's clinical history this very well could be small abscess formation related to appendicitis. There is in principle differential based on clinical presentation and appearance. Vascular/Lymphatic: Normal caliber aorta and IVC. Scattered vascular calcifications. No specific abnormal lymph node enlargement. Reproductive: Uterus and bilateral adnexa are unremarkable. Other: Anasarca. No free air. Mild mesenteric haziness. Trace perihepatic ascites. Musculoskeletal: Curvature of the spine. Scattered degenerative changes seen of the spine and pelvis. Trace retrolisthesis with stenosis at L2-3. Critical Value/emergent results were called by telephone at the time of interpretation on 08/17/2022 at 11:21 am to provider HALEY SAGE , who verbally acknowledged these results. IMPRESSION: 3.2 cm complex fluid and soft tissue area adjacent to the base of the cecum laterally in the right hemipelvis. This has separate from the ileocecal valve and the appendix is not seen as a separate structure. In light of the patient's history and appearance this very well could be sequela appendicitis. Possible small abscess. Enlarged heart with dilated IVC. Changes of chronic liver disease with fatty infiltration nodular contour. There is also hypertrophy of segment 3 of the liver. Please correlate with the history. Trace ascites. Subtle redundant loops of bowel with scattered stool. No obstruction. Tiny left pleural effusion Electronically Signed   By: Karen Kays M.D.   On: 08/17/2022 14:34    06/24/2020 JAK2      ASSESSMENT & PLAN:   89 y.o. 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 CBC showed WBC of 8.6K, hemoglobin of 13.5, and platelets mildly elevated at 491K.  -mildly elevated platelets are not concerning at this time -no polycythemia -discussed results of recent US and mammogram -Patient notes no significant toxicity from her current dose of hydroxyurea. -Continue hydroxyurea at 500 mg p.o. daily with food. -Continue vitamin B complex 1 capsule p.o. daily -Continue aspirin 81 mg p.o. daily -Current dose of Hydroxyurea should not affect the healing process following -continue to use OTC biotin mouthwash to improve tongue burning sensation -recommend buttermilk or yogurt with light cultures to displace yeast and fungal overgrowth -informed patient that antibiotics may cause thrush -discussed option of probiotics to introduce good bacteria to mouth and GI tract -discussed option of a different kind of mouthwash if her mouth issues continue to be bothersome -discussed option to mix water with 1 tsp salt, 1 tsp baking soda and gargle to improve mouth symptoms -continue to monitor with labs every 3 months  FOLLOW-UP: RTC with Dr Candise Che with labs in 4 months  The total time spent in the appointment was 25 minutes* .  All of the patient's questions were answered with apparent satisfaction. The patient knows to call the clinic with any problems, questions or concerns.   Wyvonnia Lora MD MS AAHIVMS [facility] Hematology/Oncology Physician [facility]  .*Total Encounter Time as defined by the Centers for Medicare and Medicaid Services includes, in addition to the face-to-face time  of a patient visit (documented in the note above) non-face-to-face time: obtaining and reviewing outside history, ordering and reviewing medications, tests or procedures, care coordination (communications with other health care professionals or  caregivers) and documentation in the medical record.    I,Mitra Faeizi,acting as a Neurosurgeon for Wyvonnia Lora, MD.,have documented all relevant documentation on the behalf of Wyvonnia Lora, MD,as directed by  Wyvonnia Lora, MD while in the presence of Wyvonnia Lora, MD.  .I have reviewed the above documentation for accuracy and completeness, and I agree with the above. Johney Maine MD

## 2022-09-03 ENCOUNTER — Telehealth: Payer: Self-pay | Admitting: Hematology

## 2022-09-04 ENCOUNTER — Ambulatory Visit
Admission: RE | Admit: 2022-09-04 | Discharge: 2022-09-04 | Disposition: A | Discharge status: Home / Self Care | Payer: Medicare PPO | Source: Ambulatory Visit | Attending: Internal Medicine | Admitting: Internal Medicine

## 2022-09-04 DIAGNOSIS — Z170 Estrogen receptor positive status [ER+]: Secondary | ICD-10-CM | POA: Diagnosis not present

## 2022-09-04 DIAGNOSIS — R920 Mammographic microcalcification found on diagnostic imaging of breast: Secondary | ICD-10-CM | POA: Diagnosis not present

## 2022-09-04 DIAGNOSIS — N6310 Unspecified lump in the right breast, unspecified quadrant: Secondary | ICD-10-CM

## 2022-09-04 DIAGNOSIS — D0511 Intraductal carcinoma in situ of right breast: Secondary | ICD-10-CM | POA: Diagnosis not present

## 2022-09-04 DIAGNOSIS — C50411 Malignant neoplasm of upper-outer quadrant of right female breast: Secondary | ICD-10-CM | POA: Diagnosis not present

## 2022-09-04 DIAGNOSIS — N63 Unspecified lump in unspecified breast: Secondary | ICD-10-CM | POA: Diagnosis not present

## 2022-09-04 HISTORY — PX: BREAST BIOPSY: SHX20

## 2022-09-17 ENCOUNTER — Ambulatory Visit: Payer: Self-pay | Admitting: General Surgery

## 2022-09-17 DIAGNOSIS — C50411 Malignant neoplasm of upper-outer quadrant of right female breast: Secondary | ICD-10-CM | POA: Diagnosis not present

## 2022-09-17 DIAGNOSIS — Z170 Estrogen receptor positive status [ER+]: Secondary | ICD-10-CM | POA: Diagnosis not present

## 2022-09-17 MED ORDER — KETOROLAC TROMETHAMINE 15 MG/ML IJ SOLN
15.0000 mg | Freq: Once | INTRAMUSCULAR | Status: AC
Start: 2022-09-17 — End: 2022-09-22

## 2022-09-21 ENCOUNTER — Encounter: Payer: Self-pay | Admitting: *Deleted

## 2022-09-23 ENCOUNTER — Telehealth: Payer: Self-pay | Admitting: Hematology

## 2022-10-05 ENCOUNTER — Other Ambulatory Visit (HOSPITAL_COMMUNITY): Payer: Self-pay

## 2022-10-05 ENCOUNTER — Other Ambulatory Visit: Payer: Self-pay | Admitting: Hematology

## 2022-10-05 MED ORDER — HYDROXYUREA 500 MG PO CAPS
500.0000 mg | ORAL_CAPSULE | Freq: Every day | ORAL | 1 refills | Status: DC
  Filled 2022-10-05: qty 30, 30d supply, fill #0
  Filled 2022-11-05: qty 30, 30d supply, fill #1

## 2022-10-13 ENCOUNTER — Encounter (HOSPITAL_BASED_OUTPATIENT_CLINIC_OR_DEPARTMENT_OTHER): Payer: Self-pay | Admitting: General Surgery

## 2022-10-13 ENCOUNTER — Other Ambulatory Visit: Payer: Self-pay

## 2022-10-13 NOTE — Progress Notes (Signed)
Chart reviewed with Dr. Richardson Landry, anesthesiologist, including cardiac history and pulmonary HTN. According to 07/28/22 visit with Dr. Rosemary Holms, estimated pulmonary artery systolic pressure is 60 mmHg. Patient is not a candidate for sx at [facility] and must be moved to [facility] per Dr. Richardson Landry. Archie Patten, at Dr. Billey Chang office notified of this.

## 2022-10-21 DIAGNOSIS — Z01818 Encounter for other preprocedural examination: Secondary | ICD-10-CM

## 2022-10-27 ENCOUNTER — Encounter: Payer: Self-pay | Admitting: *Deleted

## 2022-10-29 ENCOUNTER — Encounter: Payer: Self-pay | Admitting: Cardiology

## 2022-10-29 ENCOUNTER — Ambulatory Visit: Payer: Medicare PPO | Admitting: Cardiology

## 2022-10-29 VITALS — BP 170/72 | HR 57 | Resp 16 | Ht 60.0 in | Wt 95.0 lb

## 2022-10-29 DIAGNOSIS — I272 Pulmonary hypertension, unspecified: Secondary | ICD-10-CM

## 2022-10-29 DIAGNOSIS — I2721 Secondary pulmonary arterial hypertension: Secondary | ICD-10-CM

## 2022-10-29 NOTE — Progress Notes (Signed)
Patient referred by Gaspar Garbe, MD for leg edema  Subjective:   [name], female    DOB: 03/16/1933, 89 y.o.   MRN: [id number]   Chief Complaint  Patient presents with   Secondary pulmonary arterial hypertension   Follow-up    3 month    HPI  89 y.o. Caucasian female with hypertension, CKD 3a, COPD, pulmonary hypertension, multinodular goiter w/h/o thyrotoxicosis, polycythemia vera  Patient was hospitalized in 08/2022 for acute appendicitis, underwent emergency appendectomy without any perioperative cardiac issues. Blood pressure has in fact been low at home (SBP 100-120), elevated only during doctor visits. Leg edema is well controlled. She denies any exertional dyspnea. She is going to undergo right breast lumpectomy in the near future.   Initial consultation HPI 06/2020: Patient reports episodes of palpitations, mostly at night. Episodes have increase din frequency. Blood pressure is elevated today, was noted to be 152/80 mmHg at recent PCP visit. She denies chest pain, shortness of breath, palpitations, orthopnea, PND, TIA/syncope. She reports leg edema. She tells me she is known to have PAD, her feet stay cold, but denies any claudication symptoms.  She is seeing hematologist Dr. Candise Che for thrombocytosis, has upcoming blood work pending to differentiate between clonal and reactive causes.     Current Outpatient Medications:    acetaminophen (TYLENOL) 500 MG tablet, Take 2 tablets (1,000 mg total) by mouth every 6 (six) hours as needed., Disp: 30 tablet, Rfl: 0   aspirin 81 MG tablet, Take 81 mg by mouth daily at 12 noon., Disp: , Rfl:    b complex vitamins capsule, Take 1 capsule by mouth daily., Disp: , Rfl:    calcium-vitamin D 250-100 MG-UNIT per tablet, Take 1 tablet by mouth daily., Disp: , Rfl:    cetirizine (ZYRTEC) 10 MG tablet, Take 10 mg by mouth daily as needed for allergies or rhinitis., Disp: , Rfl:    diltiazem (DILACOR XR) 180 MG 24 hr capsule, Take  180 mg by mouth in the morning., Disp: , Rfl:    hydroxyurea (HYDREA) 500 MG capsule, Take 1 capsule (500 mg total) by mouth daily. May take with food to minimize GI side effects., Disp: 30 capsule, Rfl: 1   methimazole (TAPAZOLE) 5 MG tablet, Take 5 mg by mouth in the morning., Disp: , Rfl:    potassium chloride SA (KLOR-CON M) 20 MEQ tablet, TAKE 1 TABLET BY MOUTH TWICE DAILY (Patient taking differently: Take 20 mEq by mouth daily at 12 noon.), Disp: 120 tablet, Rfl: 2   psyllium (METAMUCIL) 58.6 % packet, Take 1 packet by mouth daily., Disp: , Rfl:    timolol (TIMOPTIC) 0.5 % ophthalmic solution, Place 1 drop into both eyes in the morning., Disp: , Rfl:    torsemide (DEMADEX) 20 MG tablet, Take 1 tablet (20 mg total) by mouth 2 (two) times daily. (Patient taking differently: Take 20 mg by mouth See admin instructions. Take 20 mg by mouth in the morning and at 4 PM daily), Disp: 120 tablet, Rfl: 3   losartan (COZAAR) 100 MG tablet, TAKE 1 TABLET BY MOUTH DAILY (Patient not taking: Reported on 10/29/2022), Disp: 90 tablet, Rfl: 3  Cardiovascular and other pertinent studies:  EKG 02/12/2022: Sinus rhythm 63 bpm LAFB Frequent PVCs  Echocardiogram 07/27/2022: Left ventricle cavity is normal in size and wall thickness. Normal global wall motion. Normal LV systolic function with EF 69%. Normal diastolic filling pattern.  Left atrial cavity is mildly dilated. Right atrial cavity is severely  dilated. Right ventricle cavity is mildly dilated. Normal right ventricular function. Structurally normal trileaflet aortic valve.  Mild (Grade I) aortic regurgitation. Mild to moderate mitral regurgitation. Moderate to severe tricuspid regurgitation. Estimated pulmonary artery systolic pressure 60 mmHg.  Mild pulmonic regurgitation. Trace pericardial effusion. Previous study on 01/22/2022 estimated PASP at 51 mmHg.  ABI 07/11/2020:  This exam reveals normal perfusion of the right lower extremity (ABI 1.00)   with triphasic waveform.    This exam reveals mildly decreased perfusion of the left lower extremity,  noted at the post tibial artery level (ABI 0.84) with mildly abnormal  biphasic waveform. Left AT appears to be diffusely diseased.  Mobile cardiac telemetry 7 days 06/26/2020 - 07/03/2020: Dominant rhythm: Sinus. HR 58-96 bpm. Avg HR 68 bpm, in sinus rhythm. 12 episodes of SVT/atrial tachycardia, fastest at 162 bpm for 8 beats, longest for 7 beatsat 109 bpm. 7.2% isolated SVE, <1 couplet/triplets. <1% isolated VE, couplets No atrial fibrillation/atrial flutter//VT/high grade AV block, sinus pause >3sec noted. 10 patient triggered events, most correlated with SVE.    Recent labs: 05/04/2022: Glucose 82, BUN/Cr 19/0.91. EGFR >60. Na/K 141/3.8. Rest of the CMP normal H/H 13.9/42. MCV 112. Platelets 484  09/06/2020: Chol 149, TG 60, HDL 74, LDL 63   Review of Systems  Constitutional: Negative for malaise/fatigue.  Cardiovascular:  Positive for leg swelling. Negative for chest pain, dyspnea on exertion, palpitations and syncope.         Vitals:   10/29/22 1033  BP: (!) 170/72  Pulse: (!) 57  Resp: 16  SpO2: 96%     Body mass index is 18.55 kg/m. Filed Weights   10/29/22 1033  Weight: 95 lb (43.1 kg)       Objective:   Physical Exam Vitals and nursing note reviewed.  Constitutional:      General: She is not in acute distress.    Appearance: She is well-developed.  HENT:     Head: Normocephalic and atraumatic.  Eyes:     Conjunctiva/sclera: Conjunctivae normal.     Pupils: Pupils are equal, round, and reactive to light.  Neck:     Vascular: No JVD.  Cardiovascular:     Rate and Rhythm: Normal rate and regular rhythm.     Pulses: Intact distal pulses.          Dorsalis pedis pulses are 1+ on the right side and 1+ on the left side.       Posterior tibial pulses are 0 on the right side and 0 on the left side.     Heart sounds: Murmur heard.     Holosystolic  murmur is present with a grade of 3/6 at the lower right sternal border.  Pulmonary:     Effort: Pulmonary effort is normal.     Breath sounds: Normal breath sounds. No wheezing or rales.  Abdominal:     General: Bowel sounds are normal.     Palpations: Abdomen is soft.     Tenderness: There is no rebound.  Musculoskeletal:        General: No tenderness. Normal range of motion.     Right lower leg: Edema (1+) present.     Left lower leg: Edema (1+) present.  Lymphadenopathy:     Cervical: No cervical adenopathy.  Skin:    General: Skin is warm and dry.  Neurological:     Mental Status: She is alert and oriented to person, place, and time.     Cranial Nerves: No cranial  nerve deficit.         Assessment & Recommendations:   89 y.o. Caucasian female with hypertension, CKD 3a, ?COPD, pulmonary hypertension, multinodular goiter w/h/o thyrotoxicosis, polycythemia vera  Pulmonary hypertension: Longstanding pulmonary hypertension, could either be WHO group 1 or 2.  Her history of polycythemia vera makes associated pulmonary hypertension likely.  Other than leg edema, she denies any symptoms.  Continue torsemide 20 mg bid.  She wants to hold of RHC, which is very reasonable at her age and relative lack of symptoms.  Hypertension: Component of white coat hypertension. Continue diltiazem. Take losartan 50 mg only if SBP >160 mmHg at home.  PAD: Mild PAD LLE. Continue risk factor modification In absence of bleeding, okay to continue aspirin.   Not on statin, lipids well controlled.   Echocardiogram and f/u in 3 months   Elder Negus, MD Pager: [phone] Office: [phone]

## 2022-11-02 ENCOUNTER — Encounter (HOSPITAL_COMMUNITY): Payer: Self-pay | Admitting: General Surgery

## 2022-11-02 ENCOUNTER — Other Ambulatory Visit: Payer: Self-pay

## 2022-11-02 NOTE — Progress Notes (Signed)
SDW call  Patient was given pre-op instructions over the phone. Patient verbalized understanding of instructions provided.     PCP - Dr. Guerry Bruin Cardiologist - Dr. Kathie Rhodes Pulmonary:    PPM/ICD - denies  Chest x-ray - n/a EKG -  02/12/2022 Stress Test - ECHO - 07/27/2022 Cardiac Cath -   Sleep Study/sleep apnea/CPAP: denies  Non-diabetic  Blood Thinner Instructions: denies Aspirin Instructions: states last dose 11/01/2022   ERAS Protcol - Yes, clear fluids until 0430 PRE-SURGERY Ensure or G2-    COVID TEST- n/a    Anesthesia review: Yes.  HTN, Pulmonary HTN, mitral valve prolapse, breast cancer   Patient denies shortness of breath, fever, cough and chest pain over the phone call   Your procedure is scheduled on Thursday November 05, 2022  Report to [facility] Main Entrance "A" at 0530 A.M., then check in with the Admitting office.  Call this number if you have problems the morning of surgery:  [phone]   If you have any questions prior to your surgery date call [phone]: Open Monday-Friday 8am-4pm If you experience any cold or flu symptoms such as cough, fever, chills, shortness of breath, etc. between now and your scheduled surgery, please notify us at the above number    Remember:  Do not eat after midnight the night before your surgery  You may drink clear liquids until  0430  the morning of your surgery.   Clear liquids allowed are: Water, Non-Citrus Juices (without pulp), Carbonated Beverages, Clear Tea, Black Coffee ONLY (NO MILK, CREAM OR POWDERED CREAMER of any kind), and Gatorade   Take these medicines the morning of surgery with A SIP OF WATER:  Diltiazem, methimazole, timoptic eye drops  As needed: zyrtec  As of today, STOP taking any Aleve, Naproxen, Ibuprofen, Motrin, Advil, Goody's, BC's, all herbal medications, fish oil, and all vitamins.

## 2022-11-03 NOTE — Anesthesia Preprocedure Evaluation (Addendum)
Anesthesia Evaluation  Patient identified by MRN, date of birth, ID band Patient awake    Reviewed: Allergy & Precautions, H&P , NPO status , Patient's Chart, lab work & pertinent test results  Airway Mallampati: II   Neck ROM: full    Dental   Pulmonary neg pulmonary ROS   breath sounds clear to auscultation       Cardiovascular hypertension, + Valvular Problems/Murmurs MR and MVP  Rhythm:regular Rate:Normal     Neuro/Psych    GI/Hepatic   Endo/Other    Renal/GU Renal disease     Musculoskeletal  (+) Arthritis ,    Abdominal   Peds  Hematology   Anesthesia Other Findings   Reproductive/Obstetrics                             Anesthesia Physical Anesthesia Plan  ASA: 3  Anesthesia Plan: General   Post-op Pain Management:    Induction: Intravenous  PONV Risk Score and Plan: 3 and Ondansetron, Dexamethasone and Treatment may vary due to age or medical condition  Airway Management Planned: LMA  Additional Equipment:   Intra-op Plan:   Post-operative Plan: Extubation in OR  Informed Consent: I have reviewed the patients History and Physical, chart, labs and discussed the procedure including the risks, benefits and alternatives for the proposed anesthesia with the patient or authorized representative who has indicated his/her understanding and acceptance.     Dental advisory given  Plan Discussed with: CRNA, Anesthesiologist and Surgeon  Anesthesia Plan Comments: (PAT note by Antionette Poles, PA-C:  89 year old female with pertinent history including hypertension, CKD 3a, COPD, pulmonary hypertension, multinodular goiter w/h/o thyrotoxicosis, polycythemia vera.  Followed by cardiologist Dr. Rosemary Holms.  Most recent echo 07/27/2022 showed EF 69%, normal RV function, mild aortic regurgitation, mild to moderate mitral regurgitation, moderate-severe tricuspid regurgitation, estimated PASP  60 mmHg  Hospitalized in May 2024 with acute appendicitis and underwent appendectomy without any perioperative cardiac issues.  Patient will need day of surgery labs and evaluation.  EKG 02/12/2022: Sinus rhythm 63 bpm. LAFB. Frequent PVCs  Echocardiogram 07/27/2022:  Left ventricle cavity is normal in size and wall thickness. Normal global  wall motion. Normal LV systolic function with EF 69%. Normal diastolic  filling pattern.  Left atrial cavity is mildly dilated.  Right atrial cavity is severely dilated.  Right ventricle cavity is mildly dilated. Normal right ventricular  function.  Structurally normal trileaflet aortic valve. Mild (Grade I) aortic  regurgitation.  Mild to moderate mitral regurgitation.  Moderate to severe tricuspid regurgitation. Estimated pulmonary artery  systolic pressure 60 mmHg.  Mild pulmonic regurgitation.  Trace pericardial effusion.  Previous study on 01/22/2022 estimated PASP at 51 mmHg.   )        Anesthesia Quick Evaluation

## 2022-11-03 NOTE — Progress Notes (Signed)
Surgical Instructions    Your procedure is scheduled on Thursday November 05, 2022.  Report to [facility] Main Entrance "A" at 5:30 A.M., then check in with the Admitting office.  Call this number if you have problems the morning of surgery:  [phone]   If you have any questions prior to your surgery date call [phone]: Open Monday-Friday 8am-4pm If you experience any cold or flu symptoms such as cough, fever, chills, shortness of breath, etc. between now and your scheduled surgery, please notify us at the above number     Remember:  Do not eat after midnight the night before your surgery.  You may drink clear liquids until 4:30 the morning of your surgery.   Clear liquids allowed are: Water, Non-Citrus Juices (without pulp), Carbonated Beverages, Clear Tea, Black Coffee ONLY (NO MILK, CREAM OR POWDERED CREAMER of any kind), and Gatorade.    Take these medicines the morning of surgery with A SIP OF WATER:  diltiazem (DILACOR XR)  timolol (TIMOPTIC)  methimazole (TAPAZOLE)   If Needed:  cetirizine (ZYRTEC)   Follow your surgeon's instructions on when to stop Aspirin.  If no instructions were given by your surgeon then you will need to call the office to get those instructions.    As of today, STOP taking any (unless otherwise instructed by your surgeon) Aleve, Naproxen, Ibuprofen, Motrin, Advil, Goody's, BC's, all herbal medications, fish oil, and all vitamins.  Special instructions:    Oral Hygiene is also important to reduce your risk of infection.  Remember - BRUSH YOUR TEETH THE MORNING OF SURGERY WITH YOUR REGULAR TOOTHPASTE   [facility]- Preparing For Surgery  Before surgery, you can play an important role. Because skin is not sterile, your skin needs to be as free of germs as possible. You can reduce the number of germs on your skin by washing with CHG (chlorahexidine gluconate) Soap before surgery.  CHG is an antiseptic cleaner which kills germs and bonds with the  skin to continue killing germs even after washing.     Please do not use if you have an allergy to CHG or antibacterial soaps. If your skin becomes reddened/irritated stop using the CHG.  Do not shave (including legs and underarms) for at least 48 hours prior to first CHG shower. It is OK to shave your face.  Please follow these instructions carefully.     Shower the NIGHT BEFORE SURGERY and the MORNING OF SURGERY with CHG Soap.   If you chose to wash your hair, wash your hair first as usual with your normal shampoo. After you shampoo, rinse your hair and body thoroughly to remove the shampoo.  Then [employer] and genitals (private parts) with your normal soap and rinse thoroughly to remove soap.  After that Use CHG Soap as you would any other liquid soap. You can apply CHG directly to the skin and wash gently with a scrungie or a clean washcloth.   Apply the CHG Soap to your body ONLY FROM THE NECK DOWN.  Do not use on open wounds or open sores. Avoid contact with your eyes, ears, mouth and genitals (private parts). Wash Face and genitals (private parts)  with your normal soap.   Wash thoroughly, paying special attention to the area where your surgery will be performed.  Thoroughly rinse your body with warm water from the neck down.  DO NOT shower/wash with your normal soap after using and rinsing off the CHG Soap.  Pat yourself dry with  a CLEAN TOWEL.  Wear CLEAN PAJAMAS to bed the night before surgery  Place CLEAN SHEETS on your bed the night before your surgery  DO NOT SLEEP WITH PETS.   Day of Surgery:  Take a shower with CHG soap. Wear Clean/Comfortable clothing the morning of surgery Do not apply any deodorants/lotions.   Remember to brush your teeth WITH YOUR REGULAR TOOTHPASTE.  Do not wear jewelry or makeup. Do not wear lotions, powders, perfumes/cologne or deodorant. Do not shave 48 hours prior to surgery.  Men may shave face and neck. Do not bring valuables to the  hospital. Do not wear nail polish, gel polish, artificial nails, or any other type of covering on natural nails (fingers and toes) If you have artificial nails or gel coating that need to be removed by a nail salon, please have this removed prior to surgery. Artificial nails or gel coating may interfere with anesthesia's ability to adequately monitor your vital signs.  [facility] is not responsible for any belongings or valuables.    Do NOT Smoke (Tobacco/Vaping)  24 hours prior to your procedure  If you use a CPAP at night, you may bring your mask for your overnight stay.   Contacts, glasses, hearing aids, dentures or partials may not be worn into surgery, please bring cases for these belongings   For patients admitted to the hospital, discharge time will be determined by your treatment team.   Patients discharged the day of surgery will not be allowed to drive home, and someone needs to stay with them for 24 hours.   SURGICAL WAITING ROOM VISITATION Patients having surgery or a procedure may have no more than 2 support people in the waiting area - these visitors may rotate.   Children under the age of 32 must have an adult with them who is not the patient. If the patient needs to stay at the hospital during part of their recovery, the visitor guidelines for inpatient rooms apply. Pre-op nurse will coordinate an appropriate time for 1 support person to accompany patient in pre-op.  This support person may not rotate.   Please refer to [email or web address] for the visitor guidelines for Inpatients (after your surgery is over and you are in a regular room).   If you received a COVID test during your pre-op visit, it is requested that you wear a mask when out in public, stay away from anyone that may not be feeling well, and notify your surgeon if you develop symptoms. If you have been in contact with anyone that has tested positive in the last  10 days, please notify your surgeon.    Please read over the following fact sheets that you were given.

## 2022-11-03 NOTE — Progress Notes (Signed)
Anesthesia Chart Review: Same day workup  89 year old female with pertinent history including hypertension, CKD 3a, COPD, pulmonary hypertension, multinodular goiter w/h/o thyrotoxicosis, polycythemia vera.  Followed by cardiologist Dr. Rosemary Holms.  Most recent echo 07/27/2022 showed EF 69%, normal RV function, mild aortic regurgitation, mild to moderate mitral regurgitation, moderate-severe tricuspid regurgitation, estimated PASP 60 mmHg  Hospitalized in May 2024 with acute appendicitis and underwent appendectomy without any perioperative cardiac issues.  Patient will need day of surgery labs and evaluation.  EKG 02/12/2022: Sinus rhythm 63 bpm. LAFB. Frequent PVCs  Echocardiogram 07/27/2022:  Left ventricle cavity is normal in size and wall thickness. Normal global  wall motion. Normal LV systolic function with EF 69%. Normal diastolic  filling pattern.  Left atrial cavity is mildly dilated.  Right atrial cavity is severely dilated.  Right ventricle cavity is mildly dilated. Normal right ventricular  function.  Structurally normal trileaflet aortic valve.  Mild (Grade I) aortic  regurgitation.  Mild to moderate mitral regurgitation.  Moderate to severe tricuspid regurgitation. Estimated pulmonary artery  systolic pressure 60 mmHg.  Mild pulmonic regurgitation.  Trace pericardial effusion.  Previous study on 01/22/2022 estimated PASP at 51 mmHg.     Zannie Cove [facility] Short Stay Center/Anesthesiology Phone [phone] 11/03/2022 10:50 AM

## 2022-11-04 ENCOUNTER — Encounter (HOSPITAL_COMMUNITY): Admission: RE | Admit: 2022-11-04 | Payer: Medicare PPO | Source: Ambulatory Visit

## 2022-11-05 ENCOUNTER — Other Ambulatory Visit: Payer: Self-pay

## 2022-11-05 ENCOUNTER — Ambulatory Visit (HOSPITAL_COMMUNITY): Payer: Medicare PPO | Admitting: Physician Assistant

## 2022-11-05 ENCOUNTER — Ambulatory Visit (HOSPITAL_BASED_OUTPATIENT_CLINIC_OR_DEPARTMENT_OTHER): Payer: Medicare PPO | Admitting: Physician Assistant

## 2022-11-05 ENCOUNTER — Encounter (HOSPITAL_COMMUNITY)
Admission: RE | Disposition: A | Discharge status: Home / Self Care | Payer: Self-pay | Source: Home / Self Care | Attending: General Surgery

## 2022-11-05 ENCOUNTER — Ambulatory Visit (HOSPITAL_COMMUNITY)
Admission: RE | Admit: 2022-11-05 | Discharge: 2022-11-05 | Disposition: A | Discharge status: Home / Self Care | Payer: Medicare PPO | Attending: General Surgery | Admitting: General Surgery

## 2022-11-05 ENCOUNTER — Encounter (HOSPITAL_COMMUNITY): Payer: Self-pay | Admitting: General Surgery

## 2022-11-05 DIAGNOSIS — I129 Hypertensive chronic kidney disease with stage 1 through stage 4 chronic kidney disease, or unspecified chronic kidney disease: Secondary | ICD-10-CM | POA: Insufficient documentation

## 2022-11-05 DIAGNOSIS — I2721 Secondary pulmonary arterial hypertension: Secondary | ICD-10-CM | POA: Diagnosis not present

## 2022-11-05 DIAGNOSIS — I340 Nonrheumatic mitral (valve) insufficiency: Secondary | ICD-10-CM | POA: Diagnosis not present

## 2022-11-05 DIAGNOSIS — Z170 Estrogen receptor positive status [ER+]: Secondary | ICD-10-CM | POA: Diagnosis not present

## 2022-11-05 DIAGNOSIS — E0520 Thyrotoxicosis with toxic multinodular goiter without thyrotoxic crisis or storm: Secondary | ICD-10-CM | POA: Diagnosis not present

## 2022-11-05 DIAGNOSIS — K3533 Acute appendicitis with perforation and localized peritonitis, with abscess: Secondary | ICD-10-CM | POA: Diagnosis not present

## 2022-11-05 DIAGNOSIS — N1831 Chronic kidney disease, stage 3a: Secondary | ICD-10-CM | POA: Insufficient documentation

## 2022-11-05 DIAGNOSIS — I272 Pulmonary hypertension, unspecified: Secondary | ICD-10-CM | POA: Diagnosis not present

## 2022-11-05 DIAGNOSIS — I341 Nonrheumatic mitral (valve) prolapse: Secondary | ICD-10-CM | POA: Diagnosis not present

## 2022-11-05 DIAGNOSIS — I739 Peripheral vascular disease, unspecified: Secondary | ICD-10-CM

## 2022-11-05 DIAGNOSIS — C50911 Malignant neoplasm of unspecified site of right female breast: Secondary | ICD-10-CM

## 2022-11-05 DIAGNOSIS — Z01818 Encounter for other preprocedural examination: Secondary | ICD-10-CM

## 2022-11-05 DIAGNOSIS — I083 Combined rheumatic disorders of mitral, aortic and tricuspid valves: Secondary | ICD-10-CM | POA: Insufficient documentation

## 2022-11-05 DIAGNOSIS — D0511 Intraductal carcinoma in situ of right breast: Secondary | ICD-10-CM | POA: Diagnosis not present

## 2022-11-05 DIAGNOSIS — I10 Essential (primary) hypertension: Secondary | ICD-10-CM

## 2022-11-05 DIAGNOSIS — C50411 Malignant neoplasm of upper-outer quadrant of right female breast: Secondary | ICD-10-CM | POA: Diagnosis not present

## 2022-11-05 DIAGNOSIS — J449 Chronic obstructive pulmonary disease, unspecified: Secondary | ICD-10-CM | POA: Insufficient documentation

## 2022-11-05 DIAGNOSIS — D45 Polycythemia vera: Secondary | ICD-10-CM | POA: Diagnosis not present

## 2022-11-05 HISTORY — DX: Pulmonary hypertension, unspecified: I27.20

## 2022-11-05 HISTORY — DX: Polycythemia vera: D45

## 2022-11-05 HISTORY — DX: Essential (primary) hypertension: I10

## 2022-11-05 HISTORY — PX: BREAST LUMPECTOMY: SHX2

## 2022-11-05 HISTORY — DX: Unspecified osteoarthritis, unspecified site: M19.90

## 2022-11-05 HISTORY — DX: Nonrheumatic mitral (valve) prolapse: I34.1

## 2022-11-05 SURGERY — BREAST LUMPECTOMY
Anesthesia: General | Site: Breast | Laterality: Right

## 2022-11-05 MED ORDER — PROPOFOL 10 MG/ML IV BOLUS
INTRAVENOUS | Status: AC
  Filled 2022-11-05: qty 20

## 2022-11-05 MED ORDER — 0.9 % SODIUM CHLORIDE (POUR BTL) OPTIME
TOPICAL | Status: DC | PRN
  Administered 2022-11-05: 1000 mL

## 2022-11-05 MED ORDER — VANCOMYCIN HCL IN DEXTROSE 1-5 GM/200ML-% IV SOLN: 1000.0000 mg | INTRAVENOUS | Status: AC

## 2022-11-05 MED ORDER — CHLORHEXIDINE GLUCONATE 0.12 % MT SOLN
OROMUCOSAL | Status: AC
  Filled 2022-11-05: qty 15

## 2022-11-05 MED ORDER — PROPOFOL 10 MG/ML IV BOLUS
INTRAVENOUS | Status: DC | PRN
Start: 2022-11-05 — End: 2022-11-05
  Administered 2022-11-05: 100 mg via INTRAVENOUS

## 2022-11-05 MED ORDER — EPHEDRINE SULFATE-NACL 50-0.9 MG/10ML-% IV SOSY
PREFILLED_SYRINGE | INTRAVENOUS | Status: DC | PRN
  Administered 2022-11-05 (×2): 5 mg via INTRAVENOUS

## 2022-11-05 MED ORDER — OXYCODONE HCL 5 MG PO TABS: 5.0000 mg | ORAL_TABLET | Freq: Four times a day (QID) | ORAL | 0 refills | Status: DC | PRN

## 2022-11-05 MED ORDER — GABAPENTIN 100 MG PO CAPS: 100.0000 mg | ORAL_CAPSULE | ORAL | Status: AC

## 2022-11-05 MED ORDER — BUPIVACAINE-EPINEPHRINE (PF) 0.25% -1:200000 IJ SOLN
INTRAMUSCULAR | Status: AC
  Filled 2022-11-05: qty 30

## 2022-11-05 MED ORDER — OXYCODONE HCL 5 MG/5ML PO SOLN: 5.0000 mg | Freq: Once | ORAL | Status: DC | PRN

## 2022-11-05 MED ORDER — CHLORHEXIDINE GLUCONATE CLOTH 2 % EX PADS: 6.0000 | MEDICATED_PAD | Freq: Once | CUTANEOUS | Status: DC

## 2022-11-05 MED ORDER — ONDANSETRON HCL 4 MG/2ML IJ SOLN: 4.0000 mg | Freq: Four times a day (QID) | INTRAMUSCULAR | Status: DC | PRN

## 2022-11-05 MED ORDER — LACTATED RINGERS IV SOLN: INTRAVENOUS | Status: DC

## 2022-11-05 MED ORDER — DEXAMETHASONE SODIUM PHOSPHATE 10 MG/ML IJ SOLN
INTRAMUSCULAR | Status: DC | PRN
  Administered 2022-11-05: 5 mg via INTRAVENOUS

## 2022-11-05 MED ORDER — FENTANYL CITRATE (PF) 250 MCG/5ML IJ SOLN
INTRAMUSCULAR | Status: AC
  Filled 2022-11-05: qty 5

## 2022-11-05 MED ORDER — FENTANYL CITRATE (PF) 250 MCG/5ML IJ SOLN
INTRAMUSCULAR | Status: DC | PRN
  Administered 2022-11-05: 50 ug via INTRAVENOUS

## 2022-11-05 MED ORDER — LIDOCAINE 2% (20 MG/ML) 5 ML SYRINGE
INTRAMUSCULAR | Status: DC | PRN
  Administered 2022-11-05: 20 mg via INTRAVENOUS

## 2022-11-05 MED ORDER — ACETAMINOPHEN 500 MG PO TABS
ORAL_TABLET | ORAL | Status: AC
  Administered 2022-11-05: 1000 mg via ORAL
  Filled 2022-11-05: qty 2

## 2022-11-05 MED ORDER — GABAPENTIN 100 MG PO CAPS
ORAL_CAPSULE | ORAL | Status: AC
  Administered 2022-11-05: 100 mg via ORAL
  Filled 2022-11-05: qty 1

## 2022-11-05 MED ORDER — FENTANYL CITRATE (PF) 100 MCG/2ML IJ SOLN: 25.0000 ug | INTRAMUSCULAR | Status: DC | PRN

## 2022-11-05 MED ORDER — VANCOMYCIN HCL IN DEXTROSE 1-5 GM/200ML-% IV SOLN
INTRAVENOUS | Status: AC
  Administered 2022-11-05: 1000 mg via INTRAVENOUS
  Filled 2022-11-05: qty 200

## 2022-11-05 MED ORDER — BUPIVACAINE-EPINEPHRINE 0.25% -1:200000 IJ SOLN
INTRAMUSCULAR | Status: DC | PRN
  Administered 2022-11-05: 10 mL

## 2022-11-05 MED ORDER — OXYCODONE HCL 5 MG PO TABS: 5.0000 mg | ORAL_TABLET | Freq: Once | ORAL | Status: DC | PRN

## 2022-11-05 MED ORDER — ACETAMINOPHEN 500 MG PO TABS: 1000.0000 mg | ORAL_TABLET | ORAL | Status: AC

## 2022-11-05 SURGICAL SUPPLY — 36 items
ADH SKN CLS APL DERMABOND .7 (GAUZE/BANDAGES/DRESSINGS) ×1
APL PRP STRL LF DISP 70% ISPRP (MISCELLANEOUS) ×1
APPLIER CLIP 9.375 MED OPEN (MISCELLANEOUS)
APR CLP MED 9.3 20 MLT OPN (MISCELLANEOUS)
BAG COUNTER SPONGE SURGICOUNT (BAG) IMPLANT
BAG SPNG CNTER NS LX DISP (BAG)
BINDER BREAST LRG (GAUZE/BANDAGES/DRESSINGS) IMPLANT
BINDER BREAST XLRG (GAUZE/BANDAGES/DRESSINGS) IMPLANT
CANISTER SUCT 3000ML PPV (MISCELLANEOUS) ×2 IMPLANT
CHLORAPREP W/TINT 26 (MISCELLANEOUS) ×2 IMPLANT
CLIP APPLIE 9.375 MED OPEN (MISCELLANEOUS) IMPLANT
COVER PROBE W GEL 5X96 (DRAPES) ×2 IMPLANT
COVER SURGICAL LIGHT HANDLE (MISCELLANEOUS) ×2 IMPLANT
DERMABOND ADVANCED .7 DNX12 (GAUZE/BANDAGES/DRESSINGS) ×2 IMPLANT
DEVICE DUBIN SPECIMEN MAMMOGRA (MISCELLANEOUS) ×2 IMPLANT
DRAPE CHEST BREAST 15X10 FENES (DRAPES) ×2 IMPLANT
ELECT COATED BLADE 2.86 ST (ELECTRODE) ×2 IMPLANT
ELECT REM PT RETURN 9FT ADLT (ELECTROSURGICAL) ×1
ELECTRODE REM PT RTRN 9FT ADLT (ELECTROSURGICAL) ×2 IMPLANT
GLOVE BIO SURGEON STRL SZ7.5 (GLOVE) ×4 IMPLANT
GOWN STRL REUS W/ TWL LRG LVL3 (GOWN DISPOSABLE) ×4 IMPLANT
GOWN STRL REUS W/TWL LRG LVL3 (GOWN DISPOSABLE) ×2
KIT BASIN OR (CUSTOM PROCEDURE TRAY) ×2 IMPLANT
KIT MARKER MARGIN INK (KITS) ×2 IMPLANT
LIGHT WAVEGUIDE WIDE FLAT (MISCELLANEOUS) IMPLANT
NDL HYPO 25GX1X1/2 BEV (NEEDLE) ×2 IMPLANT
NEEDLE HYPO 25GX1X1/2 BEV (NEEDLE) ×1 IMPLANT
NS IRRIG 1000ML POUR BTL (IV SOLUTION) ×2 IMPLANT
PACK GENERAL/GYN (CUSTOM PROCEDURE TRAY) ×2 IMPLANT
SUT MNCRL AB 4-0 PS2 18 (SUTURE) ×2 IMPLANT
SUT SILK 2 0 SH (SUTURE) IMPLANT
SUT VIC AB 3-0 SH 18 (SUTURE) ×2 IMPLANT
SUT VICRYL 3-0 RB1 18 ABS (SUTURE) IMPLANT
SYR CONTROL 10ML LL (SYRINGE) ×2 IMPLANT
TOWEL GREEN STERILE (TOWEL DISPOSABLE) ×2 IMPLANT
TOWEL GREEN STERILE FF (TOWEL DISPOSABLE) ×2 IMPLANT

## 2022-11-05 NOTE — Anesthesia Postprocedure Evaluation (Signed)
Anesthesia Post Note  Patient: [name]  Procedure(s) Performed: RIGHT BREAST LUMPECTOMY (Right: Breast)     Patient location during evaluation: PACU Anesthesia Type: General Level of consciousness: awake and alert Pain management: pain level controlled Vital Signs Assessment: post-procedure vital signs reviewed and stable Respiratory status: spontaneous breathing, nonlabored ventilation, respiratory function stable and patient connected to nasal cannula oxygen Cardiovascular status: blood pressure returned to baseline and stable Postop Assessment: no apparent nausea or vomiting Anesthetic complications: no   No notable events documented.  Last Vitals:  Vitals:   11/05/22 0915 11/05/22 0925  BP: (!) 173/80 136/87  Pulse: (!) 58 (!) 56  Resp: 16 16  Temp:  36.5 C  SpO2: 97% 98%    Last Pain:  Vitals:   11/05/22 0915  PainSc: 0-No pain                 Neena Beecham S

## 2022-11-05 NOTE — Transfer of Care (Signed)
Immediate Anesthesia Transfer of Care Note  Patient: [name]  Procedure(s) Performed: RIGHT BREAST LUMPECTOMY (Right: Breast)  Patient Location: PACU  Anesthesia Type:General  Level of Consciousness: drowsy  Airway & Oxygen Therapy: Patient Spontanous Breathing and Patient connected to face mask oxygen  Post-op Assessment: Report given to RN and Post -op Vital signs reviewed and stable  Post vital signs: Reviewed and stable  Last Vitals:  Vitals Value Taken Time  BP 178/71 11/05/22 0855  Temp 36.5 C 11/05/22 0855  Pulse 55 11/05/22 0859  Resp 13 11/05/22 0859  SpO2 100 % 11/05/22 0859  Vitals shown include unfiled device data.  Last Pain:  Vitals:   11/05/22 0855  PainSc: 0-No pain         Complications: No notable events documented.

## 2022-11-05 NOTE — Op Note (Signed)
11/05/2022  8:36 AM  PATIENT:  [name]  89 y.o. female  PRE-OPERATIVE DIAGNOSIS:  RIGHT BREAST CANCER  POST-OPERATIVE DIAGNOSIS:  RIGHT BREAST CANCER  PROCEDURE:  Procedure(s): RIGHT BREAST LUMPECTOMY (Right)  SURGEON:  Surgeons and Role:    * Griselda Miner, MD - Primary  PHYSICIAN ASSISTANT:   ASSISTANTS: none   ANESTHESIA:   local and general  EBL:  minimal   BLOOD ADMINISTERED:none  DRAINS: none   LOCAL MEDICATIONS USED:  MARCAINE     SPECIMEN:  Source of Specimen:  right breast tissue  DISPOSITION OF SPECIMEN:  PATHOLOGY  COUNTS:  YES  TOURNIQUET:  * No tourniquets in log *  DICTATION: .Dragon Dictation  After informed consent was obtained the patient was brought to the operating room and placed in the supine position on the operating table.  After adequate induction of general anesthesia the patient's right breast was prepped with ChloraPrep, allowed to dry, and draped in usual sterile manner.  An appropriate timeout was performed.  The patient's cancer was palpable in the upper outer quadrant of the right breast.  The area around this was infiltrated with quarter percent Marcaine.  I made an elliptical incision in the skin overlying the area of the palpable mass with a 15 blade knife.  The incision was carried through the skin and subcutaneous tissue sharply with the electrocautery.  Dissection was then carried widely around the palpable mass.  This dissection was carried all the way to the muscle of the chest wall.  Once the area was removed it was then oriented with the appropriate paint colors.  A specimen radiograph was obtained that showed the clip to be in the center of the specimen.  The specimen was then sent to pathology for further evaluation.  Hemostasis was achieved using the Bovie electrocautery.  The wound was irrigated with saline and infiltrated with more quarter percent Marcaine.  The deep layer of the incision was then closed with interrupted  3-0 Vicryl stitches.  The skin was then closed with a running 4-0 Monocryl subcuticular stitch.  Dermabond dressings were applied.  The patient tolerated the procedure well.  At the end of the case all needle sponge and instrument counts were correct.  The patient was then awakened and taken to recovery in stable condition.  PLAN OF CARE: Discharge to home after PACU  PATIENT DISPOSITION:  PACU - hemodynamically stable.   Delay start of Pharmacological VTE agent (>24hrs) due to surgical blood loss or risk of bleeding: not applicable

## 2022-11-05 NOTE — Interval H&P Note (Signed)
History and Physical Interval Note:  11/05/2022 7:41 AM  [name]  has presented today for surgery, with the diagnosis of RIGHT BREAST CANCER.  The various methods of treatment have been discussed with the patient and family. After consideration of risks, benefits and other options for treatment, the patient has consented to  Procedure(s): RIGHT BREAST LUMPECTOMY (Right) as a surgical intervention.  The patient's history has been reviewed, patient examined, no change in status, stable for surgery.  I have reviewed the patient's chart and labs.  Questions were answered to the patient's satisfaction.     [name]

## 2022-11-05 NOTE — H&P (Signed)
REFERRING PHYSICIAN: Guerry Bruin, MD PROVIDER: Lindell Noe, MD MRN: [id number] DOB: 11/03/1932 Subjective   Chief Complaint: No chief complaint on file.  History of Present Illness: [name] is a 89 y.o. female who is seen today as an office consultation for evaluation of No chief complaint on file.  We are asked to see the patient in consultation by Dr. Wylene Simmer to evaluate her for a new right breast cancer. The patient is a 89 year old white female who recently felt a mass about 2 months ago in the upper outer quadrant of the right breast. She underwent mammogram and ultrasound and was found to have a 5 mm mass in the upper outer quadrant of the right breast. The axilla looked normal. 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 She does have some pulmonary hypertension and is followed by Dr. Rosemary Holms in cardiology. She recently underwent a laparoscopic appendectomy and tolerated the surgery well. She does not smoke.  Review of Systems: A complete review of systems was obtained from the patient. I have reviewed this information and discussed as appropriate with the patient. See HPI as well for other ROS.  ROS   Medical History: Past Medical History:  Diagnosis Date  Arthritis  Glaucoma (increased eye pressure)  Heart valve disease  History of cancer  Hypertension  Thyroid disease   Patient Active Problem List  Diagnosis  Malignant neoplasm of upper-outer quadrant of right breast in female, estrogen receptor positive (CMS/HHS-HCC)   Past Surgical History:  Procedure Laterality Date  APPENDECTOMY    Allergies  Allergen Reactions  Doxycycline Calcium Other (See Comments)  Per patient, "Intestines do not tolerate very well" = GI upset  Penicillin V Nausea and Rash   Current Outpatient Medications on File Prior to Visit  Medication Sig Dispense Refill  aspirin 81 MG  EC tablet Take by mouth  cetirizine (ZYRTEC) 10 MG tablet Take by mouth  dilTIAZem (DILT-XR) 180 MG XR capsule Take by mouth  hydroxyurea (HYDREA) 500 mg capsule Take by mouth  losartan (COZAAR) 100 MG tablet Take 1 tablet by mouth once daily  methIMAzole (TAPAZOLE) 5 MG tablet Take 5 mg by mouth once daily  metroNIDAZOLE (FLAGYL) 500 MG tablet Take 500 mg by mouth 2 (two) times daily  potassium chloride (KLOR-CON M20) 20 MEQ ER tablet Take 1 tablet by mouth 2 (two) times daily  timoloL maleate (TIMOPTIC) 0.5 % ophthalmic solution Apply to eye  TORsemide (DEMADEX) 20 MG tablet Take 20 mg by mouth 2 (two) times daily   No current facility-administered medications on file prior to visit.   Family History  Problem Relation Age of Onset  Stroke Mother  High blood pressure (Hypertension) Mother  Coronary Artery Disease (Blocked arteries around heart) Father    Social History   Tobacco Use  Smoking Status Never  Smokeless Tobacco Never    Social History   Socioeconomic History  Marital status: Married  Tobacco Use  Smoking status: Never  Smokeless tobacco: Never  Substance and Sexual Activity  Alcohol use: Not Currently  Drug use: Never   Social Determinants of Health   Food Insecurity: No Food Insecurity (08/18/2022)  Received from [facility]  Hunger Vital Sign  Worried About Running Out of Food in the Last Year: Never true  Ran Out of Food in the Last Year: Never true  Transportation Needs: No Transportation Needs (08/18/2022)  Received from [facility] - Transportation  Lack of Transportation (Medical): No  Lack of Transportation (Non-Medical): No  Received from [facility]  Social Network   Objective:  There were no vitals filed for this visit.  There is no height or weight on file to calculate BMI.  Physical Exam Vitals reviewed.  Constitutional:  General: She is not in acute distress. Appearance: Normal appearance.  HENT:  Head: Normocephalic  and atraumatic.  Right Ear: External ear normal.  Left Ear: External ear normal.  Nose: Nose normal.  Mouth/Throat:  Mouth: Mucous membranes are moist.  Pharynx: Oropharynx is clear.  Eyes:  General: No scleral icterus. Extraocular Movements: Extraocular movements intact.  Conjunctiva/sclera: Conjunctivae normal.  Pupils: Pupils are equal, round, and reactive to light.  Cardiovascular:  Rate and Rhythm: Normal rate and regular rhythm.  Pulses: Normal pulses.  Heart sounds: Normal heart sounds.  Pulmonary:  Effort: Pulmonary effort is normal. No respiratory distress.  Breath sounds: Normal breath sounds.  Abdominal:  General: Bowel sounds are normal.  Palpations: Abdomen is soft.  Tenderness: There is no abdominal tenderness.  Musculoskeletal:  General: No swelling, tenderness or deformity. Normal range of motion.  Cervical back: Normal range of motion and neck supple.  Skin: General: Skin is warm and dry.  Coloration: Skin is not jaundiced.  Neurological:  General: No focal deficit present.  Mental Status: She is alert and oriented to person, place, and time.  Psychiatric:  Mood and Affect: Mood normal.  Behavior: Behavior normal.     Breast: There is a 1 cm mass in the upper outer quadrant of the right breast. There is no palpable mass in the left breast. There is no palpable axillary, supraclavicular, or cervical lymphadenopathy.  Labs, Imaging and Diagnostic Testing:  Assessment and Plan:   Diagnoses and all orders for this visit:  Malignant neoplasm of upper-outer quadrant of right breast in female, estrogen receptor positive (CMS/HHS-HCC)   The patient appears to have a 5 mm mass in the upper outer quadrant of the right breast with clinically negative nodes and all favorable markers. I have discussed with her in detail the options for treatment and at this point she favors breast conservation which I feel is very reasonable. She will not need a node evaluation. I  have discussed with her in detail the risks and benefits of the operation as well as some of the technical aspects and she understands and wishes to proceed. Since it is palpable she will not need a seed localization. She will meet with her medical and radiation oncology docs to discuss adjuvant therapy. We will proceed with surgical scheduling.

## 2022-11-05 NOTE — Anesthesia Procedure Notes (Signed)
Procedure Name: LMA Insertion Date/Time: 11/05/2022 8:03 AM  Performed by: Gwenyth Allegra, CRNAPre-anesthesia Checklist: Patient identified, Emergency Drugs available, Suction available, Patient being monitored and Timeout performed Patient Re-evaluated:Patient Re-evaluated prior to induction Oxygen Delivery Method: Circle system utilized Preoxygenation: Pre-oxygenation with 100% oxygen Induction Type: IV induction LMA: LMA inserted LMA Size: 4.0 Number of attempts: 1 Placement Confirmation: positive ETCO2 and breath sounds checked- equal and bilateral Tube secured with: Tape

## 2022-11-06 ENCOUNTER — Encounter (HOSPITAL_COMMUNITY): Payer: Self-pay | Admitting: General Surgery

## 2022-11-06 LAB — SURGICAL PATHOLOGY

## 2022-11-10 ENCOUNTER — Encounter: Payer: Self-pay | Admitting: *Deleted

## 2022-11-18 ENCOUNTER — Inpatient Hospital Stay: Payer: Medicare PPO

## 2022-11-18 ENCOUNTER — Ambulatory Visit: Payer: Medicare PPO | Admitting: Hematology

## 2022-11-18 ENCOUNTER — Other Ambulatory Visit: Payer: Medicare PPO

## 2022-11-18 ENCOUNTER — Inpatient Hospital Stay: Payer: Medicare PPO | Admitting: Physician Assistant

## 2022-11-19 ENCOUNTER — Encounter: Payer: Self-pay | Admitting: *Deleted

## 2022-11-26 ENCOUNTER — Other Ambulatory Visit: Payer: Self-pay

## 2022-11-26 DIAGNOSIS — D471 Chronic myeloproliferative disease: Secondary | ICD-10-CM

## 2022-11-27 ENCOUNTER — Other Ambulatory Visit: Payer: Self-pay

## 2022-11-27 ENCOUNTER — Inpatient Hospital Stay: Payer: Medicare PPO | Admitting: Hematology

## 2022-11-27 ENCOUNTER — Inpatient Hospital Stay: Payer: Medicare PPO | Attending: Hematology

## 2022-11-27 VITALS — BP 171/60 | HR 68 | Temp 97.9°F | Resp 17 | Wt 97.7 lb

## 2022-11-27 DIAGNOSIS — Z170 Estrogen receptor positive status [ER+]: Secondary | ICD-10-CM | POA: Insufficient documentation

## 2022-11-27 DIAGNOSIS — D471 Chronic myeloproliferative disease: Secondary | ICD-10-CM

## 2022-11-27 DIAGNOSIS — C50411 Malignant neoplasm of upper-outer quadrant of right female breast: Secondary | ICD-10-CM | POA: Insufficient documentation

## 2022-11-27 DIAGNOSIS — C50911 Malignant neoplasm of unspecified site of right female breast: Secondary | ICD-10-CM | POA: Diagnosis not present

## 2022-11-27 DIAGNOSIS — Z7982 Long term (current) use of aspirin: Secondary | ICD-10-CM | POA: Insufficient documentation

## 2022-11-27 DIAGNOSIS — E860 Dehydration: Secondary | ICD-10-CM | POA: Insufficient documentation

## 2022-11-27 DIAGNOSIS — D45 Polycythemia vera: Secondary | ICD-10-CM | POA: Diagnosis not present

## 2022-11-27 DIAGNOSIS — D7589 Other specified diseases of blood and blood-forming organs: Secondary | ICD-10-CM | POA: Diagnosis not present

## 2022-11-27 LAB — CMP (CANCER CENTER ONLY)
ALT: 16 U/L (ref 0–44)
AST: 24 U/L (ref 15–41)
Albumin: 4.1 g/dL (ref 3.5–5.0)
Alkaline Phosphatase: 82 U/L (ref 38–126)
Anion gap: 8 (ref 5–15)
BUN: 31 mg/dL — ABNORMAL HIGH (ref 8–23)
CO2: 31 mmol/L (ref 22–32)
Calcium: 9.4 mg/dL (ref 8.9–10.3)
Chloride: 103 mmol/L (ref 98–111)
Creatinine: 1.32 mg/dL — ABNORMAL HIGH (ref 0.44–1.00)
GFR, Estimated: 39 mL/min — ABNORMAL LOW (ref >60)
Glucose, Bld: 98 mg/dL (ref 70–99)
Potassium: 4.2 mmol/L (ref 3.5–5.1)
Sodium: 142 mmol/L (ref 135–145)
Total Bilirubin: 0.6 mg/dL (ref 0.3–1.2)
Total Protein: 6.9 g/dL (ref 6.5–8.1)

## 2022-11-27 LAB — LACTATE DEHYDROGENASE: LDH: 175 U/L (ref 98–192)

## 2022-11-27 LAB — CBC WITH DIFFERENTIAL (CANCER CENTER ONLY)
Abs Immature Granulocytes: 0.03 10*3/uL (ref 0.00–0.07)
Basophils Absolute: 0.1 10*3/uL (ref 0.0–0.1)
Basophils Relative: 1 %
Eosinophils Absolute: 0.2 10*3/uL (ref 0.0–0.5)
Eosinophils Relative: 3 %
HCT: 41.8 % (ref 36.0–46.0)
Hemoglobin: 13.7 g/dL (ref 12.0–15.0)
Immature Granulocytes: 0 %
Lymphocytes Relative: 17 %
Lymphs Abs: 1.2 10*3/uL (ref 0.7–4.0)
MCH: 37.2 pg — ABNORMAL HIGH (ref 26.0–34.0)
MCHC: 32.8 g/dL (ref 30.0–36.0)
MCV: 113.6 fL — ABNORMAL HIGH (ref 80.0–100.0)
Monocytes Absolute: 0.5 10*3/uL (ref 0.1–1.0)
Monocytes Relative: 8 %
Neutro Abs: 4.8 10*3/uL (ref 1.7–7.7)
Neutrophils Relative %: 71 %
Platelet Count: 411 10*3/uL — ABNORMAL HIGH (ref 150–400)
RBC: 3.68 MIL/uL — ABNORMAL LOW (ref 3.87–5.11)
RDW: 13.5 % (ref 11.5–15.5)
WBC Count: 6.7 10*3/uL (ref 4.0–10.5)
nRBC: 0 % (ref 0.0–0.2)

## 2022-11-27 NOTE — Progress Notes (Signed)
HEMATOLOGY/ONCOLOGY CLINIC NOTE  Date of Service: 11/27/22   Patient Care Team: [name], [name], MD as PCP - General (Internal Medicine) Pershing Proud, RN as Oncology Nurse Navigator Donnelly Angelica, RN as Oncology Nurse Navigator Candise Che, Corene Cornea, MD as Consulting Physician (Hematology)  CHIEF COMPLAINTS/PURPOSE OF CONSULTATION:  Follow-up for continued evaluation and management of polycythemia vera  HISTORY OF PRESENTING ILLNESS:   Please see previous notes on initial presentation of disease  INTERVAL HISTORY  [name] is here for continued evaluation and management of polycythemia vera.   Patient was last seen by me on 09/02/2022 and was healing from her microscopic appendectomy. She complained of frequent bowel movements, burning sensation in tongue, 3-pound weight loss, and breast lump.   Biopsy of 5 mm in upper outer quadrant of right on 11/05/2022 showed grade 1 invasive ductal cancer that was ER and PR positive and HER2 negative with a Ki-67 of 2%.   Today, she is accompanied by her husband. She reports that she is healing well from her lumpectomy on 11/05/2022. Patient notes some soreness when rubbing the area but denies any surgical draining, new significant redness, swelling or signs of infection.   She regularly takes Hydroxyurea one tablet daily with no toxicity issues. Patient continues to take vitamin B complex regularly.  She denies any abdominal pain or change in bowel habits. She reports that she does endorse osteopenia. Patient takes magnesium, Vitamin D, and calcium supplements regularly.   MEDICAL HISTORY:  Past Medical History:  Diagnosis Date   Arthritis    Hypertension    Mitral valve prolapse    Osteopenia    Polycythemia vera (HCC)    Pulmonary hypertension (HCC)    Thyroid disease    Varicose veins VENOUS INSUFFICIENCY SLOW HEALING OF WOUNDS [name] TO LOWER EXTEMETIES   Venous stasis dermatitis     SURGICAL HISTORY: Past  Surgical History:  Procedure Laterality Date   BREAST BIOPSY Right 09/04/2022   [place] RT BREAST BX W LOC DEV 1ST LESION IMG BX SPEC US GUIDE 09/04/2022 [facility]MO[facility]HY   BREAST CYST ASPIRATION Left    BREAST LUMPECTOMY Right 11/05/2022   Procedure: RIGHT BREAST LUMPECTOMY;  Surgeon: Griselda Miner, MD;  Location: [facility] OR;  Service: General;  Laterality: Right;   EYE SURGERY     LAPAROSCOPIC APPENDECTOMY N/A 08/18/2022   Procedure: APPENDECTOMY LAPAROSCOPIC;  Surgeon: Darnell Level, MD;  Location: [facility] ORS;  Service: General;  Laterality: N/A;   TONSILLECTOMY      SOCIAL HISTORY: Social History   Socioeconomic History   Marital status: Married    Spouse name: Not on file   Number of children: 2   Years of education: Not on file   Highest education level: Not on file  Occupational History   Not on file  Tobacco Use   Smoking status: Never   Smokeless tobacco: Never  Vaping Use   Vaping status: Never Used  Substance and Sexual Activity   Alcohol use: No   Drug use: Never   Sexual activity: Not on file  Other Topics Concern   Not on file  Social History Narrative   Not on file   Social Determinants of Health   Financial Resource Strain: Not on file  Food Insecurity: No Food Insecurity (08/18/2022)   Hunger Vital Sign    Worried About Running Out of Food in the Last Year: Never true    Ran Out of Food in the Last Year: Never true  Transportation Needs: No Transportation Needs (08/18/2022)   PRAPARE - Administrator, Civil Service (Medical): No    Lack of Transportation (Non-Medical): No  Physical Activity: Not on file  Stress: Not on file  Social Connections: Unknown (09/02/2021)   [facility] from [facility]   Social Network    Social Network: Not on file  Intimate Partner Violence: Not At Risk (08/18/2022)   Humiliation, Afraid, Rape, and Kick questionnaire    Fear of Current or Ex-Partner: No    Emotionally Abused: No    Physically Abused: No    Sexually  Abused: No    FAMILY HISTORY: Family History  Problem Relation Age of Onset   Heart disease Father    Cancer Son    Cancer Maternal Aunt    Cancer Paternal Uncle    Cancer Maternal Grandfather    Breast cancer Neg Hx     ALLERGIES:  is allergic to doxycycline calcium and penicillin v.  MEDICATIONS:  Current Outpatient Medications  Medication Sig Dispense Refill   aspirin 81 MG tablet Take 81 mg by mouth daily at 12 noon.     b complex vitamins capsule Take 1 capsule by mouth daily.     bacitracin-polymyxin b (POLYSPORIN) ointment Apply 1 Application topically 2 (two) times daily as needed (wound care).     CALCIUM-MAGNESIUM-VITAMIN D PO Take 1 tablet by mouth daily.     cetirizine (ZYRTEC) 10 MG tablet Take 10 mg by mouth daily as needed for allergies or rhinitis.     diltiazem (DILACOR XR) 180 MG 24 hr capsule Take 180 mg by mouth in the morning.     hydroxyurea (HYDREA) 500 MG capsule Take 1 capsule (500 mg total) by mouth daily. May take with food to minimize GI side effects. 30 capsule 1   losartan (COZAAR) 100 MG tablet TAKE 1 TABLET BY MOUTH DAILY (Patient taking differently: Take 50 mg by mouth daily as needed (high blood pressure).) 90 tablet 3   methimazole (TAPAZOLE) 5 MG tablet Take 5 mg by mouth in the morning.     oxyCODONE (ROXICODONE) 5 MG immediate release tablet Take 1 tablet (5 mg total) by mouth every 6 (six) hours as needed for severe pain. 10 tablet 0   potassium chloride SA (KLOR-CON M) 20 MEQ tablet TAKE 1 TABLET BY MOUTH TWICE DAILY (Patient taking differently: Take 20 mEq by mouth daily at 12 noon.) 120 tablet 2   Psyllium (METAMUCIL PO) Take 1 Scoop by mouth daily.     timolol (TIMOPTIC) 0.5 % ophthalmic solution Place 1 drop into both eyes in the morning.     torsemide (DEMADEX) 20 MG tablet Take 1 tablet (20 mg total) by mouth 2 (two) times daily. 120 tablet 3   No current facility-administered medications for this visit.    REVIEW OF SYSTEMS:    10  Point review of Systems was done is negative except as noted above.   PHYSICAL EXAMINATION: ECOG PERFORMANCE STATUS: 2  . Vitals:   11/27/22 1520  BP: (!) 171/60  Pulse: 68  Resp: 17  Temp: 97.9 F (36.6 C)  SpO2: 99%     Filed Weights   11/27/22 1520  Weight: 97 lb 11.2 oz (44.3 kg)    .Body mass index is 19.08 kg/m.  GENERAL:alert, in no acute distress and comfortable SKIN: no acute rashes, no significant lesions EYES: conjunctiva are pink and non-injected, sclera anicteric OROPHARYNX: MMM, no exudates, no oropharyngeal erythema or ulceration NECK: supple,  no JVD LYMPH:  no palpable lymphadenopathy in the cervical, axillary or inguinal regions LUNGS: clear to auscultation b/l with normal respiratory effort HEART: regular rate & rhythm ABDOMEN:  normoactive bowel sounds , non tender, not distended. Extremity: no pedal edema PSYCH: alert & oriented x 3 with fluent speech NEURO: no focal motor/sensory deficits   LABORATORY DATA:  I have reviewed the data as listed     Latest Ref Rng & Units 11/27/2022    2:17 PM 09/02/2022    9:54 AM 08/19/2022    4:56 AM  CBC  WBC 4.0 - 10.5 K/uL 6.7  8.6  19.6   Hemoglobin 12.0 - 15.0 g/dL 14.7  82.9  56.2   Hematocrit 36.0 - 46.0 % 41.8  39.9  42.1   Platelets 150 - 400 K/uL 411  491  507     .    Latest Ref Rng & Units 11/27/2022    2:17 PM 09/02/2022    9:54 AM 08/19/2022    4:56 AM  CMP  Glucose 70 - 99 mg/dL 98  130  865   BUN 8 - 23 mg/dL 31  32  24   Creatinine 0.44 - 1.00 mg/dL 7.84  6.96  2.95   Sodium 135 - 145 mmol/L 142  141  139   Potassium 3.5 - 5.1 mmol/L 4.2  3.4  4.3   Chloride 98 - 111 mmol/L 103  103  100   CO2 22 - 32 mmol/L 31  33  30   Calcium 8.9 - 10.3 mg/dL 9.4  9.1  9.2   Total Protein 6.5 - 8.1 g/dL 6.9  6.8    Total Bilirubin 0.3 - 1.2 mg/dL 0.6  0.6    Alkaline Phos 38 - 126 U/L 82  70    AST 15 - 41 U/L 24  26    ALT 0 - 44 U/L 16  22     . Lab Results  Component Value Date   LDH 175  11/27/2022      RADIOGRAPHIC STUDIES: I have personally reviewed the radiological images as listed and agreed with the findings in the report. No results found.  06/24/2020 JAK2    11/05/2022 right breast lumpectomy:     ASSESSMENT & PLAN:   89 y.o. 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 PLAN:  -Patient appears to be recovering well from her lumpectomy procedure. She will have her post-surgical follow up next Thursday.  -discussed lab results on 11/27/2022 in detail with patient. CBC normal, showed WBC of 6.7K, hemoglobin of 13.7, and platelets stable at 411K. -Patient notes no significant toxicity from her current dose of hydroxyurea. -Continue hydroxyurea at 500 mg p.o. daily with food. -Continue vitamin B complex 1 capsule p.o. daily -Continue aspirin 81 mg p.o. daily -CMP shows mild dehydration, potassium normal  -advised patient to optimize hydration levels -patient does endorse grade 1 invasive ductal cancer that was ER and PR positive and HER2 negative with a Ki-67 of 2%. Tumor was under 1 cm in size. Margins are clear.  -discussed several considerations regarding tumor characteristics Clear margins 2.  No sign of tumor invading nerves in area, lymphatics, or blood vessel  3. Precancerous change has been removed 4. KI-67 low at 2%, which suggests slow-growing type of cancer 5.  Estrogen progesterone receptor status determines treatment considerations. Tumor was strongly estrogen and progesterone receptor positive and negative for HER2.  -no visible cancer remaining in body at this time.  -do not strongly recommend radiation therapy at this time as it would require holding hydroxyurea for a period of time, would expose radiation to the chest wall and lung to some degree, and  the associated risks would be higher than potential benefits. -informed patient that radiation therapy would reduce the risk of local recurrence of cancer to the rest of the breast -no high risk factors at this time -no role for adjuvant chemotherapy at this time.  -discussed details of hormone blocking pill such as Letrozole which may reduce the risk of recurrence by nearly 50%. Discussed potential risks of hormone blocking pill such as osteoporosis, hot flashes, or aches/pains.  -recommend hormone blocking pill to reduce risk of recurrence in same breast, other breast and systemically -will order bone density scan in 2-3 weeks to decide between standard treatment or other medications considerations based on findings -recommend patient to optimize calcium by taking 1g of calcium daily -recommend patient to take at least 2000 units vitamin D daily. Goal to  keep vitamin D levels between 60-90 -recommend patient to regularly consume dairy in diet -answered all of patient's and her husband's questions regarding treatment  FOLLOW-UP: DEXA scan in 3-4 weeks RTC with Dr Candise Che in 6 weeks with labs  The total time spent in the appointment was 41 minutes* .  All of the patient's questions were answered with apparent satisfaction. The patient knows to call the clinic with any problems, questions or concerns.   Wyvonnia Lora MD MS AAHIVMS [facility] Hematology/Oncology Physician [facility]  .*Total Encounter Time as defined by the Centers for Medicare and Medicaid Services includes, in addition to the face-to-face time of a patient visit (documented in the note above) non-face-to-face time: obtaining and reviewing outside history, ordering and reviewing medications, tests or procedures, care coordination (communications with other health care professionals or caregivers) and documentation in the medical record.    I,Mitra Faeizi,acting as a Neurosurgeon for Wyvonnia Lora, MD.,have documented all  relevant documentation on the behalf of Wyvonnia Lora, MD,as directed by  Wyvonnia Lora, MD while in the presence of Wyvonnia Lora, MD.  .I have reviewed the above documentation for accuracy and completeness, and I agree with the above. Johney Maine MD

## 2022-11-30 ENCOUNTER — Encounter: Payer: Self-pay | Admitting: *Deleted

## 2022-11-30 DIAGNOSIS — C50919 Malignant neoplasm of unspecified site of unspecified female breast: Secondary | ICD-10-CM

## 2022-12-07 ENCOUNTER — Other Ambulatory Visit (HOSPITAL_COMMUNITY): Payer: Self-pay

## 2022-12-07 ENCOUNTER — Other Ambulatory Visit: Payer: Self-pay | Admitting: Hematology

## 2022-12-07 MED ORDER — HYDROXYUREA 500 MG PO CAPS
500.0000 mg | ORAL_CAPSULE | Freq: Every day | ORAL | 1 refills | Status: DC
  Filled 2022-12-07: qty 30, 30d supply, fill #0
  Filled 2023-01-05: qty 30, 30d supply, fill #1

## 2022-12-16 DIAGNOSIS — H5202 Hypermetropia, left eye: Secondary | ICD-10-CM | POA: Diagnosis not present

## 2022-12-16 DIAGNOSIS — H524 Presbyopia: Secondary | ICD-10-CM | POA: Diagnosis not present

## 2022-12-16 DIAGNOSIS — H40012 Open angle with borderline findings, low risk, left eye: Secondary | ICD-10-CM | POA: Diagnosis not present

## 2022-12-16 DIAGNOSIS — H52223 Regular astigmatism, bilateral: Secondary | ICD-10-CM | POA: Diagnosis not present

## 2022-12-16 DIAGNOSIS — H53143 Visual discomfort, bilateral: Secondary | ICD-10-CM | POA: Diagnosis not present

## 2022-12-16 DIAGNOSIS — H17811 Minor opacity of cornea, right eye: Secondary | ICD-10-CM | POA: Diagnosis not present

## 2022-12-25 ENCOUNTER — Other Ambulatory Visit: Payer: Medicare PPO

## 2022-12-25 ENCOUNTER — Ambulatory Visit: Payer: Medicare PPO | Admitting: Hematology

## 2022-12-29 DIAGNOSIS — I129 Hypertensive chronic kidney disease with stage 1 through stage 4 chronic kidney disease, or unspecified chronic kidney disease: Secondary | ICD-10-CM | POA: Diagnosis not present

## 2022-12-29 DIAGNOSIS — E0520 Thyrotoxicosis with toxic multinodular goiter without thyrotoxic crisis or storm: Secondary | ICD-10-CM | POA: Diagnosis not present

## 2022-12-29 DIAGNOSIS — N1831 Chronic kidney disease, stage 3a: Secondary | ICD-10-CM | POA: Diagnosis not present

## 2022-12-29 DIAGNOSIS — M810 Age-related osteoporosis without current pathological fracture: Secondary | ICD-10-CM | POA: Diagnosis not present

## 2023-01-04 ENCOUNTER — Other Ambulatory Visit: Payer: Self-pay

## 2023-01-04 DIAGNOSIS — C50919 Malignant neoplasm of unspecified site of unspecified female breast: Secondary | ICD-10-CM

## 2023-01-04 DIAGNOSIS — D471 Chronic myeloproliferative disease: Secondary | ICD-10-CM

## 2023-01-05 ENCOUNTER — Other Ambulatory Visit (HOSPITAL_COMMUNITY): Payer: Self-pay

## 2023-01-05 ENCOUNTER — Inpatient Hospital Stay: Payer: Medicare PPO | Admitting: Hematology

## 2023-01-05 ENCOUNTER — Inpatient Hospital Stay: Payer: Medicare PPO | Attending: Hematology

## 2023-01-05 VITALS — BP 161/51 | HR 65 | Temp 97.9°F | Resp 18 | Wt 98.2 lb

## 2023-01-05 DIAGNOSIS — Z170 Estrogen receptor positive status [ER+]: Secondary | ICD-10-CM

## 2023-01-05 DIAGNOSIS — D45 Polycythemia vera: Secondary | ICD-10-CM | POA: Insufficient documentation

## 2023-01-05 DIAGNOSIS — Z7982 Long term (current) use of aspirin: Secondary | ICD-10-CM | POA: Insufficient documentation

## 2023-01-05 DIAGNOSIS — C50919 Malignant neoplasm of unspecified site of unspecified female breast: Secondary | ICD-10-CM

## 2023-01-05 DIAGNOSIS — D7589 Other specified diseases of blood and blood-forming organs: Secondary | ICD-10-CM | POA: Diagnosis not present

## 2023-01-05 DIAGNOSIS — D471 Chronic myeloproliferative disease: Secondary | ICD-10-CM | POA: Diagnosis not present

## 2023-01-05 LAB — CBC WITH DIFFERENTIAL (CANCER CENTER ONLY)
Abs Immature Granulocytes: 0.03 10*3/uL (ref 0.00–0.07)
Basophils Absolute: 0.1 10*3/uL (ref 0.0–0.1)
Basophils Relative: 1 %
Eosinophils Absolute: 0.2 10*3/uL (ref 0.0–0.5)
Eosinophils Relative: 3 %
HCT: 43.3 % (ref 36.0–46.0)
Hemoglobin: 14.3 g/dL (ref 12.0–15.0)
Immature Granulocytes: 0 %
Lymphocytes Relative: 16 %
Lymphs Abs: 1.2 10*3/uL (ref 0.7–4.0)
MCH: 38.1 pg — ABNORMAL HIGH (ref 26.0–34.0)
MCHC: 33 g/dL (ref 30.0–36.0)
MCV: 115.5 fL — ABNORMAL HIGH (ref 80.0–100.0)
Monocytes Absolute: 0.6 10*3/uL (ref 0.1–1.0)
Monocytes Relative: 8 %
Neutro Abs: 5.4 10*3/uL (ref 1.7–7.7)
Neutrophils Relative %: 72 %
Platelet Count: 413 10*3/uL — ABNORMAL HIGH (ref 150–400)
RBC: 3.75 MIL/uL — ABNORMAL LOW (ref 3.87–5.11)
RDW: 14.2 % (ref 11.5–15.5)
WBC Count: 7.6 10*3/uL (ref 4.0–10.5)
nRBC: 0 % (ref 0.0–0.2)

## 2023-01-05 LAB — CMP (CANCER CENTER ONLY)
ALT: 21 U/L (ref 0–44)
AST: 29 U/L (ref 15–41)
Albumin: 4.3 g/dL (ref 3.5–5.0)
Alkaline Phosphatase: 98 U/L (ref 38–126)
Anion gap: 5 (ref 5–15)
BUN: 29 mg/dL — ABNORMAL HIGH (ref 8–23)
CO2: 36 mmol/L — ABNORMAL HIGH (ref 22–32)
Calcium: 9.8 mg/dL (ref 8.9–10.3)
Chloride: 102 mmol/L (ref 98–111)
Creatinine: 1.18 mg/dL — ABNORMAL HIGH (ref 0.44–1.00)
GFR, Estimated: 44 mL/min — ABNORMAL LOW (ref >60)
Glucose, Bld: 101 mg/dL — ABNORMAL HIGH (ref 70–99)
Potassium: 3.8 mmol/L (ref 3.5–5.1)
Sodium: 143 mmol/L (ref 135–145)
Total Bilirubin: 0.7 mg/dL (ref 0.3–1.2)
Total Protein: 7.4 g/dL (ref 6.5–8.1)

## 2023-01-05 LAB — LACTATE DEHYDROGENASE: LDH: 191 U/L (ref 98–192)

## 2023-01-05 MED ORDER — LETROZOLE 2.5 MG PO TABS: 2.5000 mg | ORAL_TABLET | Freq: Every day | ORAL | 5 refills | Status: DC

## 2023-01-05 NOTE — Progress Notes (Addendum)
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   Patient was last seen by me on 11/27/2022 and she was doing well overall.   Patient is unaccompanied during this visit. She notes she has been doing well overall without any new or severe medical concerns since our last visit. Patient notes she is scheduled for bone density scan in March. She is planning to talk to her PCP if she can get her bone density study earlier. Patient's last bone density study was around 9-10 years ago. Patient does not remember the exact bone density results.   She has recovered well after her surgery.   She denies any new infection issues, fever, chills, night sweats, abdominal pain, chest pain, back pain, or leg swelling.   Patient notes that she is currently in process in moving out of her house to independent living facility, friend's home facility, in [place]. However, they have not signed a contract.   Patient regularly takes Vitamin-D supplement.   MEDICAL HISTORY:  Past Medical History:  Diagnosis Date   Arthritis    Hypertension    Mitral valve prolapse    Osteopenia    Polycythemia vera (HCC)    Pulmonary hypertension (HCC)    Thyroid disease    Varicose veins VENOUS INSUFFICIENCY SLOW HEALING OF WOUNDS [name] TO LOWER EXTEMETIES   Venous stasis dermatitis     SURGICAL HISTORY: Past Surgical History:  Procedure  Laterality Date   BREAST BIOPSY Right 09/04/2022   [place] RT BREAST BX W LOC DEV 1ST LESION IMG BX SPEC US GUIDE 09/04/2022 [facility]MO[facility]   BREAST CYST ASPIRATION Left    BREAST LUMPECTOMY Right 11/05/2022   Procedure: RIGHT BREAST LUMPECTOMY;  Surgeon: Griselda Miner, MD;  Location: [facility];  Service: General;  Laterality: Right;   EYE SURGERY     LAPAROSCOPIC APPENDECTOMY N/A 08/18/2022   Procedure: APPENDECTOMY LAPAROSCOPIC;  Surgeon: Darnell Level, MD;  Location: [facility];  Service: General;  Laterality: N/A;   TONSILLECTOMY      SOCIAL HISTORY: Social History   Socioeconomic History   Marital status: Married    Spouse name: Not on file   Number of children: 2   Years of education: Not on file   Highest education level: Not on file  Occupational History   Not on file  Tobacco Use   Smoking status: Never   Smokeless tobacco: Never  Vaping Use   Vaping status: Never Used  Substance and Sexual Activity   Alcohol use: No   Drug use: Never   Sexual activity: Not on file  Other Topics Concern   Not on file  Social History Narrative   Not on file   Social Determinants of Health   Financial Resource Strain: Not on file  Food Insecurity: No Food Insecurity (08/18/2022)   Hunger Vital Sign    Worried About Running Out of Food in the Last Year: Never true    Ran Out of Food in the Last Year:  Never true  Transportation Needs: No Transportation Needs (08/18/2022)   PRAPARE - Administrator, Civil Service (Medical): No    Lack of Transportation (Non-Medical): No  Physical Activity: Not on file  Stress: Not on file  Social Connections: Unknown (09/02/2021)   Received from [facility]   Social Network    Social Network: Not on file  Intimate Partner Violence: Not At Risk (08/18/2022)   Humiliation, Afraid, Rape, and Kick questionnaire    Fear of Current or Ex-Partner: No    Emotionally Abused: No    Physically Abused: No    Sexually Abused: No     FAMILY HISTORY: Family History  Problem Relation Age of Onset   Heart disease Father    Cancer Son    Cancer Maternal Aunt    Cancer Paternal Uncle    Cancer Maternal Grandfather    Breast cancer Neg Hx     ALLERGIES:  is allergic to doxycycline calcium and penicillin v.  MEDICATIONS:  Current Outpatient Medications  Medication Sig Dispense Refill   aspirin 81 MG tablet Take 81 mg by mouth daily at 12 noon.     b complex vitamins capsule Take 1 capsule by mouth daily.     bacitracin-polymyxin b (POLYSPORIN) ointment Apply 1 Application topically 2 (two) times daily as needed (wound care).     CALCIUM-MAGNESIUM-VITAMIN D PO Take 1 tablet by mouth daily.     cetirizine (ZYRTEC) 10 MG tablet Take 10 mg by mouth daily as needed for allergies or rhinitis.     diltiazem (DILACOR XR) 180 MG 24 hr capsule Take 180 mg by mouth in the morning.     hydroxyurea (HYDREA) 500 MG capsule Take 1 capsule (500 mg total) by mouth daily. May take with food to minimize GI side effects. 30 capsule 1   losartan (COZAAR) 100 MG tablet TAKE 1 TABLET BY MOUTH DAILY (Patient taking differently: Take 50 mg by mouth daily as needed (high blood pressure).) 90 tablet 3   methimazole (TAPAZOLE) 5 MG tablet Take 5 mg by mouth in the morning.     oxyCODONE (ROXICODONE) 5 MG immediate release tablet Take 1 tablet (5 mg total) by mouth every 6 (six) hours as needed for severe pain. 10 tablet 0   potassium chloride SA (KLOR-CON M) 20 MEQ tablet TAKE 1 TABLET BY MOUTH TWICE DAILY (Patient taking differently: Take 20 mEq by mouth daily at 12 noon.) 120 tablet 2   Psyllium (METAMUCIL PO) Take 1 Scoop by mouth daily.     timolol (TIMOPTIC) 0.5 % ophthalmic solution Place 1 drop into both eyes in the morning.     torsemide (DEMADEX) 20 MG tablet Take 1 tablet (20 mg total) by mouth 2 (two) times daily. 120 tablet 3   No current facility-administered medications for this visit.    REVIEW OF SYSTEMS:    10 Point  review of Systems was done is negative except as noted above.   PHYSICAL EXAMINATION: ECOG PERFORMANCE STATUS: 2  . Vitals:   01/05/23 1457  BP: (!) 161/51  Pulse: 65  Resp: 18  Temp: 97.9 F (36.6 C)  SpO2: 100%      Filed Weights   01/05/23 1457  Weight: 98 lb 3.2 oz (44.5 kg)     .Body mass index is 19.18 kg/m.  GENERAL:alert, in no acute distress and comfortable SKIN: no acute rashes, no significant lesions EYES: conjunctiva are pink and non-injected, sclera anicteric OROPHARYNX: MMM, no exudates,  no oropharyngeal erythema or ulceration NECK: supple, no JVD LYMPH:  no palpable lymphadenopathy in the cervical, axillary or inguinal regions LUNGS: clear to auscultation b/l with normal respiratory effort HEART: regular rate & rhythm ABDOMEN:  normoactive bowel sounds , non tender, not distended. Extremity: no pedal edema PSYCH: alert & oriented x 3 with fluent speech NEURO: no focal motor/sensory deficits   LABORATORY DATA:  I have reviewed the data as listed     Latest Ref Rng & Units 01/05/2023    2:39 PM 11/27/2022    2:17 PM 09/02/2022    9:54 AM  CBC  WBC 4.0 - 10.5 K/uL 7.6  6.7  8.6   Hemoglobin 12.0 - 15.0 g/dL 16.1  09.6  04.5   Hematocrit 36.0 - 46.0 % 43.3  41.8  39.9   Platelets 150 - 400 K/uL 413  411  491     .    Latest Ref Rng & Units 01/05/2023    2:39 PM 11/27/2022    2:17 PM 09/02/2022    9:54 AM  CMP  Glucose 70 - 99 mg/dL 409  98  811   BUN 8 - 23 mg/dL 29  31  32   Creatinine 0.44 - 1.00 mg/dL 9.14  7.82  9.56   Sodium 135 - 145 mmol/L 143  142  141   Potassium 3.5 - 5.1 mmol/L 3.8  4.2  3.4   Chloride 98 - 111 mmol/L 102  103  103   CO2 22 - 32 mmol/L 36  31  33   Calcium 8.9 - 10.3 mg/dL 9.8  9.4  9.1   Total Protein 6.5 - 8.1 g/dL 7.4  6.9  6.8   Total Bilirubin 0.3 - 1.2 mg/dL 0.7  0.6  0.6   Alkaline Phos 38 - 126 U/L 98  82  70   AST 15 - 41 U/L 29  24  26    ALT 0 - 44 U/L 21  16  22     . Lab Results  Component Value  Date   LDH 191 01/05/2023      RADIOGRAPHIC STUDIES: I have personally reviewed the radiological images as listed and agreed with the findings in the report. No results found.  06/24/2020 JAK2    11/05/2022 right breast lumpectomy:     ASSESSMENT & PLAN:   89 y.o. 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  PLAN: --Patient notes no significant toxicity from her current dose of hydroxyurea. -Continue hydroxyurea at 500 mg p.o. daily with food. -Continue aspirin 81 mg p.o. daily  -no visible cancer remaining in body at this time.  -Discussed lab results from today, 01/05/2023, with the patient. CBC shows elevated platelet at 413 K, but stable overall. CMP is stable -Answered all of patient's questions regarding Letrozole.  -discussed details of hormone blocking pill such as Letrozole which may reduce the risk of recurrence by nearly 50%. Discussed potential risks of hormone blocking pill such as osteoporosis, hot flashes, or aches/pains.  -Patient wants to start Letrozole. She will discontinue it if she has toxicities. -Discussed with the patient that the goal for vitamin-D level is between 60 and 90. -f/u for pending DEXA scan  FOLLOW-UP: RTC with Dr Candise Che in 6 weeks with labs   The total time spent in  the appointment was 30 minutes* .  All of the patient's questions were answered with apparent satisfaction. The patient knows to call the clinic with any problems, questions or concerns.   Wyvonnia Lora MD MS AAHIVMS [facility] Hematology/Oncology Physician [facility]  .*Total Encounter Time as defined by the Centers for Medicare and Medicaid Services includes, in addition to the face-to-face time of a patient visit (documented in the note above) non-face-to-face  time: obtaining and reviewing outside history, ordering and reviewing medications, tests or procedures, care coordination (communications with other health care professionals or caregivers) and documentation in the medical record.   I,Param Shah,acting as a Neurosurgeon for Wyvonnia Lora, MD.,have documented all relevant documentation on the behalf of Wyvonnia Lora, MD,as directed by  Wyvonnia Lora, MD while in the presence of Wyvonnia Lora, MD.    .I have reviewed the above documentation for accuracy and completeness, and I agree with the above. Johney Maine MD

## 2023-01-06 DIAGNOSIS — H401120 Primary open-angle glaucoma, left eye, stage unspecified: Secondary | ICD-10-CM | POA: Diagnosis not present

## 2023-01-06 DIAGNOSIS — H18231 Secondary corneal edema, right eye: Secondary | ICD-10-CM | POA: Diagnosis not present

## 2023-01-06 DIAGNOSIS — H353221 Exudative age-related macular degeneration, left eye, with active choroidal neovascularization: Secondary | ICD-10-CM | POA: Diagnosis not present

## 2023-01-06 DIAGNOSIS — H18421 Band keratopathy, right eye: Secondary | ICD-10-CM | POA: Diagnosis not present

## 2023-01-06 DIAGNOSIS — H53143 Visual discomfort, bilateral: Secondary | ICD-10-CM | POA: Diagnosis not present

## 2023-01-07 ENCOUNTER — Other Ambulatory Visit (HOSPITAL_COMMUNITY): Payer: Self-pay

## 2023-01-07 DIAGNOSIS — Z1331 Encounter for screening for depression: Secondary | ICD-10-CM | POA: Diagnosis not present

## 2023-01-07 DIAGNOSIS — I1310 Hypertensive heart and chronic kidney disease without heart failure, with stage 1 through stage 4 chronic kidney disease, or unspecified chronic kidney disease: Secondary | ICD-10-CM | POA: Diagnosis not present

## 2023-01-07 DIAGNOSIS — N1831 Chronic kidney disease, stage 3a: Secondary | ICD-10-CM | POA: Diagnosis not present

## 2023-01-07 DIAGNOSIS — C50911 Malignant neoplasm of unspecified site of right female breast: Secondary | ICD-10-CM | POA: Diagnosis not present

## 2023-01-07 DIAGNOSIS — I739 Peripheral vascular disease, unspecified: Secondary | ICD-10-CM | POA: Diagnosis not present

## 2023-01-07 DIAGNOSIS — D45 Polycythemia vera: Secondary | ICD-10-CM | POA: Diagnosis not present

## 2023-01-07 DIAGNOSIS — R82998 Other abnormal findings in urine: Secondary | ICD-10-CM | POA: Diagnosis not present

## 2023-01-07 DIAGNOSIS — D471 Chronic myeloproliferative disease: Secondary | ICD-10-CM | POA: Diagnosis not present

## 2023-01-07 DIAGNOSIS — Z0000 Encounter for general adult medical examination without abnormal findings: Secondary | ICD-10-CM | POA: Diagnosis not present

## 2023-01-07 DIAGNOSIS — E0520 Thyrotoxicosis with toxic multinodular goiter without thyrotoxic crisis or storm: Secondary | ICD-10-CM | POA: Diagnosis not present

## 2023-01-07 DIAGNOSIS — J449 Chronic obstructive pulmonary disease, unspecified: Secondary | ICD-10-CM | POA: Diagnosis not present

## 2023-01-07 DIAGNOSIS — Z1339 Encounter for screening examination for other mental health and behavioral disorders: Secondary | ICD-10-CM | POA: Diagnosis not present

## 2023-01-07 DIAGNOSIS — Z23 Encounter for immunization: Secondary | ICD-10-CM | POA: Diagnosis not present

## 2023-01-22 ENCOUNTER — Telehealth: Payer: Self-pay | Admitting: Adult Health

## 2023-01-22 NOTE — Telephone Encounter (Signed)
Per IB Message on 01/19/23; I called the patient and left a message with details of her SCP appointment. I also mailed a reminder notice.

## 2023-01-23 ENCOUNTER — Telehealth: Payer: Self-pay | Admitting: Hematology

## 2023-01-23 NOTE — Telephone Encounter (Signed)
Left patient a message in regards to scheduled follow up visit with Kale per 01/05/2023 LOS

## 2023-01-29 ENCOUNTER — Other Ambulatory Visit: Payer: Medicare PPO

## 2023-02-04 ENCOUNTER — Other Ambulatory Visit: Payer: Self-pay | Admitting: Hematology

## 2023-02-04 ENCOUNTER — Other Ambulatory Visit (HOSPITAL_COMMUNITY): Payer: Self-pay

## 2023-02-04 MED ORDER — HYDROXYUREA 500 MG PO CAPS
500.0000 mg | ORAL_CAPSULE | Freq: Every day | ORAL | 1 refills | Status: DC
  Filled 2023-02-04: qty 30, 30d supply, fill #0
  Filled 2023-03-09: qty 30, 30d supply, fill #1

## 2023-02-05 ENCOUNTER — Other Ambulatory Visit (HOSPITAL_COMMUNITY): Payer: Self-pay

## 2023-02-11 DIAGNOSIS — H401120 Primary open-angle glaucoma, left eye, stage unspecified: Secondary | ICD-10-CM | POA: Diagnosis not present

## 2023-02-11 DIAGNOSIS — H3562 Retinal hemorrhage, left eye: Secondary | ICD-10-CM | POA: Diagnosis not present

## 2023-02-11 DIAGNOSIS — H53143 Visual discomfort, bilateral: Secondary | ICD-10-CM | POA: Diagnosis not present

## 2023-02-11 DIAGNOSIS — H18231 Secondary corneal edema, right eye: Secondary | ICD-10-CM | POA: Diagnosis not present

## 2023-02-11 DIAGNOSIS — H353221 Exudative age-related macular degeneration, left eye, with active choroidal neovascularization: Secondary | ICD-10-CM | POA: Diagnosis not present

## 2023-02-11 DIAGNOSIS — H18421 Band keratopathy, right eye: Secondary | ICD-10-CM | POA: Diagnosis not present

## 2023-02-17 ENCOUNTER — Other Ambulatory Visit: Payer: Self-pay

## 2023-02-17 DIAGNOSIS — C50919 Malignant neoplasm of unspecified site of unspecified female breast: Secondary | ICD-10-CM

## 2023-02-19 ENCOUNTER — Inpatient Hospital Stay: Payer: Medicare PPO | Attending: Hematology

## 2023-02-19 ENCOUNTER — Inpatient Hospital Stay: Payer: Medicare PPO | Admitting: Hematology

## 2023-02-19 VITALS — BP 165/60 | HR 60 | Temp 97.9°F | Resp 18 | Wt 95.4 lb

## 2023-02-19 DIAGNOSIS — C50911 Malignant neoplasm of unspecified site of right female breast: Secondary | ICD-10-CM | POA: Insufficient documentation

## 2023-02-19 DIAGNOSIS — C50919 Malignant neoplasm of unspecified site of unspecified female breast: Secondary | ICD-10-CM | POA: Diagnosis not present

## 2023-02-19 DIAGNOSIS — D7589 Other specified diseases of blood and blood-forming organs: Secondary | ICD-10-CM | POA: Insufficient documentation

## 2023-02-19 DIAGNOSIS — D45 Polycythemia vera: Secondary | ICD-10-CM | POA: Insufficient documentation

## 2023-02-19 DIAGNOSIS — Z79811 Long term (current) use of aromatase inhibitors: Secondary | ICD-10-CM | POA: Diagnosis not present

## 2023-02-19 DIAGNOSIS — Z170 Estrogen receptor positive status [ER+]: Secondary | ICD-10-CM | POA: Insufficient documentation

## 2023-02-19 DIAGNOSIS — D471 Chronic myeloproliferative disease: Secondary | ICD-10-CM

## 2023-02-19 LAB — CBC WITH DIFFERENTIAL (CANCER CENTER ONLY)
Abs Immature Granulocytes: 0.04 10*3/uL (ref 0.00–0.07)
Basophils Absolute: 0.1 10*3/uL (ref 0.0–0.1)
Basophils Relative: 1 %
Eosinophils Absolute: 0.2 10*3/uL (ref 0.0–0.5)
Eosinophils Relative: 3 %
HCT: 40.9 % (ref 36.0–46.0)
Hemoglobin: 13.6 g/dL (ref 12.0–15.0)
Immature Granulocytes: 1 %
Lymphocytes Relative: 14 %
Lymphs Abs: 1.2 10*3/uL (ref 0.7–4.0)
MCH: 38.6 pg — ABNORMAL HIGH (ref 26.0–34.0)
MCHC: 33.3 g/dL (ref 30.0–36.0)
MCV: 116.2 fL — ABNORMAL HIGH (ref 80.0–100.0)
Monocytes Absolute: 0.8 10*3/uL (ref 0.1–1.0)
Monocytes Relative: 10 %
Neutro Abs: 5.8 10*3/uL (ref 1.7–7.7)
Neutrophils Relative %: 71 %
Platelet Count: 424 10*3/uL — ABNORMAL HIGH (ref 150–400)
RBC: 3.52 MIL/uL — ABNORMAL LOW (ref 3.87–5.11)
RDW: 13.4 % (ref 11.5–15.5)
WBC Count: 8.1 10*3/uL (ref 4.0–10.5)
nRBC: 0 % (ref 0.0–0.2)

## 2023-02-19 LAB — CMP (CANCER CENTER ONLY)
ALT: 18 U/L (ref 0–44)
AST: 26 U/L (ref 15–41)
Albumin: 4.2 g/dL (ref 3.5–5.0)
Alkaline Phosphatase: 87 U/L (ref 38–126)
Anion gap: 4 — ABNORMAL LOW (ref 5–15)
BUN: 27 mg/dL — ABNORMAL HIGH (ref 8–23)
CO2: 37 mmol/L — ABNORMAL HIGH (ref 22–32)
Calcium: 9.8 mg/dL (ref 8.9–10.3)
Chloride: 100 mmol/L (ref 98–111)
Creatinine: 1.35 mg/dL — ABNORMAL HIGH (ref 0.44–1.00)
GFR, Estimated: 38 mL/min — ABNORMAL LOW (ref >60)
Glucose, Bld: 90 mg/dL (ref 70–99)
Potassium: 3.7 mmol/L (ref 3.5–5.1)
Sodium: 141 mmol/L (ref 135–145)
Total Bilirubin: 0.7 mg/dL (ref 0.3–1.2)
Total Protein: 7.1 g/dL (ref 6.5–8.1)

## 2023-02-19 LAB — LACTATE DEHYDROGENASE: LDH: 197 U/L — ABNORMAL HIGH (ref 98–192)

## 2023-02-19 NOTE — Progress Notes (Signed)
HEMATOLOGY/ONCOLOGY CLINIC NOTE  Date of Service: 02/19/23   Patient Care Team: [name], [name], MD as PCP - General (Internal Medicine) Pershing Proud, RN as Oncology Nurse Navigator Donnelly Angelica, RN as Oncology Nurse Navigator Candise Che, Corene Cornea, MD as Consulting Physician (Hematology)  CHIEF COMPLAINTS/PURPOSE OF CONSULTATION:  Follow-up for continued evaluation and management of polycythemia vera  HISTORY OF PRESENTING ILLNESS:   Please see previous notes on initial presentation of disease  INTERVAL HISTORY  [name] is here for continued evaluation and management of polycythemia vera.   Patient was last seen by me on 01/05/2023 and was doing well overall.   Today, she is accompanied by her husband. Patient reports that she is planning to move out of her home around March 21, 2023 into an independent living facility.   She reports that her vitamin D level was 51 in September 2024. Patient is unsure of how much vitamin D replacement she is taking, but notes that she is taking more now compared to previously.   She takes Letrozole regularly. Patient reports that she is tolerating Letrozole well with no new or severe toxicities.   She denies any significant back pain or leg swelling at this time.  Patient continues to take 500 MG Hydroxyurea regularly and denies any significant toxicities. She complains of a change in her fingernails.  Patient presents her dexascan results from 2016 and 2018, which showed that she did have osteopenia at the time, but not osteoporosis.  MEDICAL HISTORY:  Past Medical History:  Diagnosis Date   Arthritis    Hypertension    Mitral valve prolapse    Osteopenia    Polycythemia vera (HCC)    Pulmonary hypertension (HCC)    Thyroid disease    Varicose veins VENOUS INSUFFICIENCY SLOW HEALING OF WOUNDS [name] TO LOWER EXTEMETIES   Venous stasis dermatitis     SURGICAL HISTORY: Past Surgical History:  Procedure  Laterality Date   BREAST BIOPSY Right 09/04/2022   [place] RT BREAST BX W LOC DEV 1ST LESION IMG BX SPEC US GUIDE 09/04/2022 [facility]AMMOGRAPHY   BREAST CYST ASPIRATION Left    BREAST LUMPECTOMY Right 11/05/2022   Procedure: RIGHT BREAST LUMPECTOMY;  Surgeon: Griselda Miner, MD;  Location: [facility] OR;  Service: General;  Laterality: Right;   EYE SURGERY     LAPAROSCOPIC APPENDECTOMY N/A 08/18/2022   Procedure: APPENDECTOMY LAPAROSCOPIC;  Surgeon: Darnell Level, MD;  Location: [facility];  Service: General;  Laterality: N/A;   TONSILLECTOMY      SOCIAL HISTORY: Social History   Socioeconomic History   Marital status: Married    Spouse name: Not on file   Number of children: 2   Years of education: Not on file   Highest education level: Not on file  Occupational History   Not on file  Tobacco Use   Smoking status: Never   Smokeless tobacco: Never  Vaping Use   Vaping status: Never Used  Substance and Sexual Activity   Alcohol use: No   Drug use: Never   Sexual activity: Not on file  Other Topics Concern   Not on file  Social History Narrative   Not on file   Social Determinants of Health   Financial Resource Strain: Not on file  Food Insecurity: No Food Insecurity (08/18/2022)   Hunger Vital Sign    Worried About Running Out of Food in the Last Year: Never true    Ran Out of Food in the Last  Year: Never true  Transportation Needs: No Transportation Needs (08/18/2022)   PRAPARE - Administrator, Civil Service (Medical): No    Lack of Transportation (Non-Medical): No  Physical Activity: Not on file  Stress: Not on file  Social Connections: Unknown (09/02/2021)   Received from [facility]   Social Network    Social Network: Not on file  Intimate Partner Violence: Not At Risk (08/18/2022)   Humiliation, Afraid, Rape, and Kick questionnaire    Fear of Current or Ex-Partner: No    Emotionally Abused: No    Physically Abused: No    Sexually Abused: No     FAMILY HISTORY: Family History  Problem Relation Age of Onset   Heart disease Father    Cancer Son    Cancer Maternal Aunt    Cancer Paternal Uncle    Cancer Maternal Grandfather    Breast cancer Neg Hx     ALLERGIES:  is allergic to doxycycline calcium and penicillin v.  MEDICATIONS:  Current Outpatient Medications  Medication Sig Dispense Refill   aspirin 81 MG tablet Take 81 mg by mouth daily at 12 noon.     b complex vitamins capsule Take 1 capsule by mouth daily.     bacitracin-polymyxin b (POLYSPORIN) ointment Apply 1 Application topically 2 (two) times daily as needed (wound care).     CALCIUM-MAGNESIUM-VITAMIN D PO Take 1 tablet by mouth daily.     cetirizine (ZYRTEC) 10 MG tablet Take 10 mg by mouth daily as needed for allergies or rhinitis.     diltiazem (DILACOR XR) 180 MG 24 hr capsule Take 180 mg by mouth in the morning.     hydroxyurea (HYDREA) 500 MG capsule Take 1 capsule (500 mg total) by mouth daily. May take with food to minimize GI side effects. 30 capsule 1   letrozole (FEMARA) 2.5 MG tablet Take 1 tablet (2.5 mg total) by mouth daily. 30 tablet 5   losartan (COZAAR) 100 MG tablet TAKE 1 TABLET BY MOUTH DAILY (Patient taking differently: Take 50 mg by mouth daily as needed (high blood pressure).) 90 tablet 3   methimazole (TAPAZOLE) 5 MG tablet Take 5 mg by mouth in the morning.     oxyCODONE (ROXICODONE) 5 MG immediate release tablet Take 1 tablet (5 mg total) by mouth every 6 (six) hours as needed for severe pain. 10 tablet 0   potassium chloride SA (KLOR-CON M) 20 MEQ tablet TAKE 1 TABLET BY MOUTH TWICE DAILY (Patient taking differently: Take 20 mEq by mouth daily at 12 noon.) 120 tablet 2   Psyllium (METAMUCIL PO) Take 1 Scoop by mouth daily.     timolol (TIMOPTIC) 0.5 % ophthalmic solution Place 1 drop into both eyes in the morning.     torsemide (DEMADEX) 20 MG tablet Take 1 tablet (20 mg total) by mouth 2 (two) times daily. 120 tablet 3   No current  facility-administered medications for this visit.    REVIEW OF SYSTEMS:    10 Point review of Systems was done is negative except as noted above.   PHYSICAL EXAMINATION: ECOG PERFORMANCE STATUS: 2  . There were no vitals filed for this visit.     There were no vitals filed for this visit.    .There is no height or weight on file to calculate BMI.  GENERAL:alert, in no acute distress and comfortable SKIN: no acute rashes, no significant lesions EYES: conjunctiva are pink and non-injected, sclera anicteric OROPHARYNX: MMM, no  exudates, no oropharyngeal erythema or ulceration NECK: supple, no JVD LYMPH:  no palpable lymphadenopathy in the cervical, axillary or inguinal regions LUNGS: clear to auscultation b/l with normal respiratory effort HEART: regular rate & rhythm ABDOMEN:  normoactive bowel sounds , non tender, not distended. Extremity: no pedal edema PSYCH: alert & oriented x 3 with fluent speech NEURO: no focal motor/sensory deficits   LABORATORY DATA:  I have reviewed the data as listed     Latest Ref Rng & Units 01/05/2023    2:39 PM 11/27/2022    2:17 PM 09/02/2022    9:54 AM  CBC  WBC 4.0 - 10.5 K/uL 7.6  6.7  8.6   Hemoglobin 12.0 - 15.0 g/dL 16.1  09.6  04.5   Hematocrit 36.0 - 46.0 % 43.3  41.8  39.9   Platelets 150 - 400 K/uL 413  411  491     .    Latest Ref Rng & Units 01/05/2023    2:39 PM 11/27/2022    2:17 PM 09/02/2022    9:54 AM  CMP  Glucose 70 - 99 mg/dL 409  98  811   BUN 8 - 23 mg/dL 29  31  32   Creatinine 0.44 - 1.00 mg/dL 9.14  7.82  9.56   Sodium 135 - 145 mmol/L 143  142  141   Potassium 3.5 - 5.1 mmol/L 3.8  4.2  3.4   Chloride 98 - 111 mmol/L 102  103  103   CO2 22 - 32 mmol/L 36  31  33   Calcium 8.9 - 10.3 mg/dL 9.8  9.4  9.1   Total Protein 6.5 - 8.1 g/dL 7.4  6.9  6.8   Total Bilirubin 0.3 - 1.2 mg/dL 0.7  0.6  0.6   Alkaline Phos 38 - 126 U/L 98  82  70   AST 15 - 41 U/L 29  24  26    ALT 0 - 44 U/L 21  16  22     . Lab  Results  Component Value Date   LDH 191 01/05/2023      RADIOGRAPHIC STUDIES: I have personally reviewed the radiological images as listed and agreed with the findings in the report. No results found.  06/24/2020 JAK2    11/05/2022 right breast lumpectomy:     ASSESSMENT & PLAN:   89 y.o. 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  PLAN:  -Discussed lab results on 02/19/23 in detail with patient. CBC stable, showed WBC of 8.1K, hemoglobin of 13.6, hematocrit 40.9, and platelets of 424K. -hematocrit normal -platelets and hgb stable -WBC normal -CMP stable -LDH stable -continue Letrozole for breast cancer prophylaxis -Patient reports no significant toxicity from her current dose of hydroxyurea. -Continue hydroxyurea at 500 mg p.o. daily with food -changes in fingernails are likely from hydroxyurea -continue low-dose hydroxyurea 500 mg once a day -recommend optimizing calcium and vitamin D -continue vitamin D replacement -discussed vitamin D target goal of 60-90 -discussed option of starting prolia injection every 6 months for bone streghtening due to significant osteopenia to reduce the risk of fractures by 25-30% and counteract some effects of Letrozole in the bones -she will take some time to consider Prolia at this time -Discussed the main side effect of Prolia, which is that it may  cause calcium levels to be low. Also discussed that if any dental work may be needed, Prolia can delay the healing process in the jaw bone -will plan for a repeat bone density scan for further evaluation -answered all of patient's questions in detail -will plan for next visit in 3-4 months   FOLLOW-UP: RTC with Dr Candise Che with labs in 3 months  The total time spent in the appointment was ***  minutes* .  All of the patient's questions were answered with apparent satisfaction. The patient knows to call the clinic with any problems, questions or concerns.   Wyvonnia Lora MD MS AAHIVMS [facility] Hematology/Oncology Physician [facility]  .*Total Encounter Time as defined by the Centers for Medicare and Medicaid Services includes, in addition to the face-to-face time of a patient visit (documented in the note above) non-face-to-face time: obtaining and reviewing outside history, ordering and reviewing medications, tests or procedures, care coordination (communications with other health care professionals or caregivers) and documentation in the medical record.    I,Mitra Faeizi,acting as a Neurosurgeon for Wyvonnia Lora, MD.,have documented all relevant documentation on the behalf of Wyvonnia Lora, MD,as directed by  Wyvonnia Lora, MD while in the presence of Wyvonnia Lora, MD.  ***

## 2023-03-03 ENCOUNTER — Ambulatory Visit: Payer: Self-pay | Admitting: Cardiology

## 2023-03-09 ENCOUNTER — Other Ambulatory Visit (HOSPITAL_COMMUNITY): Payer: Self-pay

## 2023-03-15 ENCOUNTER — Ambulatory Visit (HOSPITAL_COMMUNITY): Payer: Medicare PPO | Attending: Cardiology

## 2023-03-15 DIAGNOSIS — I272 Pulmonary hypertension, unspecified: Secondary | ICD-10-CM | POA: Insufficient documentation

## 2023-03-15 LAB — ECHOCARDIOGRAM COMPLETE
Area-P 1/2: 4.63 cm2
P 1/2 time: 583 ms
S' Lateral: 2.2 cm

## 2023-03-17 ENCOUNTER — Telehealth: Payer: Self-pay | Admitting: Cardiology

## 2023-03-17 NOTE — Telephone Encounter (Signed)
Wanted to reschedule appt on 12/4 but told her next appt wasn't until March and she want to see what Dr. Rosemary Holms thinks.

## 2023-03-17 NOTE — Telephone Encounter (Signed)
Returned a call back to the pt.  Pt can't attend her appt with Dr. Rosemary Holms on 12/4 due to moving to a new house that whole week.  She requested anytime in late Dec or Jan.    Cancelled her 12/4 appt and rescheduled her to see Dr. Rosemary Holms on 04/27/23 at 1140.   Pt aware to arrive 15 mins prior to this appt.  Pt verbalized understanding and agrees with this plan.

## 2023-03-24 ENCOUNTER — Ambulatory Visit: Payer: Medicare PPO | Admitting: Cardiology

## 2023-03-29 ENCOUNTER — Encounter: Payer: Self-pay | Admitting: *Deleted

## 2023-03-29 ENCOUNTER — Inpatient Hospital Stay: Payer: Medicare PPO | Attending: Hematology | Admitting: Adult Health

## 2023-03-29 ENCOUNTER — Encounter: Payer: Self-pay | Admitting: Adult Health

## 2023-03-29 VITALS — BP 146/53 | HR 59 | Temp 97.7°F | Resp 17 | Ht 60.0 in | Wt 95.9 lb

## 2023-03-29 DIAGNOSIS — D45 Polycythemia vera: Secondary | ICD-10-CM | POA: Insufficient documentation

## 2023-03-29 DIAGNOSIS — Z79811 Long term (current) use of aromatase inhibitors: Secondary | ICD-10-CM | POA: Insufficient documentation

## 2023-03-29 DIAGNOSIS — Z170 Estrogen receptor positive status [ER+]: Secondary | ICD-10-CM | POA: Insufficient documentation

## 2023-03-29 DIAGNOSIS — C50411 Malignant neoplasm of upper-outer quadrant of right female breast: Secondary | ICD-10-CM | POA: Insufficient documentation

## 2023-03-29 NOTE — Progress Notes (Signed)
SURVIVORSHIP VISIT:  BRIEF ONCOLOGIC HISTORY:  Oncology History  Malignant neoplasm of upper-outer quadrant of right breast in female, estrogen receptor positive (HCC)  09/04/2022 Surgery   A. BREAST, RIGHT, LUMPECTOMY:  - Invasive ductal carcinoma, 0.8 cm, grade 1  - Ductal carcinoma in situ, intermediate grade  - Resection margins are negative for carcinoma; closest is [place] inferior  margin at 0.5 cm  - Negative for lymphovascular or perineural invasion    01/05/2023 -  Anti-estrogen oral therapy   Letrozole   03/29/2023 Initial Diagnosis   Malignant neoplasm of upper-outer quadrant of right breast in female, estrogen receptor positive (HCC)     INTERVAL HISTORY:  [name] to review her survivorship care plan detailing her treatment course for breast cancer, as well as monitoring long-term side effects of that treatment, education regarding health maintenance, screening, and overall wellness and health promotion.     Overall, [name] with a history of right-sided stage 1A breast cancer, estrogen and progesterone positive, presents for a follow-up visit after treatment. She underwent a lumpectomy and is currently on Letrozole. She reports no significant side effects from [place]yenne medication, except for a possible change in her nails. She denies any new breast symptoms or concerns.  [name] also mentions a history of polycythemia vera, managed by Dr. Candise Che. She has been experiencing some stress and anxiety due to recent life events, including a home heating issue and Cheyenne upcoming holiday season. However, she denies any depressive symptoms at this time.  [name] is generally satisfied with her current state of health and has no new complaints or concerns. She is looking forward to spending Christmas at home and is making efforts to ensure that her living situation is comfortable and warm. She expresses gratitude for [place] support she has received during her cancer treatment and  follow-up care.  REVIEW OF SYSTEMS:  Review of Systems  Constitutional:  Negative for appetite change, chills, fatigue, fever and unexpected weight change.  HENT:   Negative for hearing loss, lump/mass and trouble swallowing.   Eyes:  Negative for eye problems and icterus.  Respiratory:  Negative for chest tightness, cough and shortness of breath.   Cardiovascular:  Negative for chest pain, leg swelling and palpitations.  Gastrointestinal:  Negative for abdominal distention, abdominal pain, constipation, diarrhea, nausea and vomiting.  Endocrine: Negative for hot flashes.  Genitourinary:  Negative for difficulty urinating.   Musculoskeletal:  Negative for arthralgias.  Skin:  Negative for itching and rash.  Neurological:  Negative for dizziness, extremity weakness, headaches and numbness.  Hematological:  Negative for adenopathy. Does not bruise/bleed easily.  Psychiatric/Behavioral:  Negative for depression. [name] is not nervous/anxious.    Breast: Denies any new nodularity, masses, tenderness, nipple changes, or nipple discharge.       PAST MEDICAL/SURGICAL HISTORY:  Past Medical History:  Diagnosis Date   Arthritis    Hypertension    Mitral valve prolapse    Osteopenia    Polycythemia vera (HCC)    Pulmonary hypertension (HCC)    Thyroid disease    Varicose veins VENOUS INSUFFICIENCY SLOW HEALING OF WOUNDS [name] TO LOWER EXTEMETIES   Venous stasis dermatitis    Past Surgical History:  Procedure Laterality Date   BREAST BIOPSY Right 09/04/2022   [place] RT BREAST BX W LOC DEV 1ST LESION IMG BX SPEC US GUIDE 09/04/2022 [facility] MAMMOGRAPHY   BREAST CYST ASPIRATION Left    BREAST LUMPECTOMY Right 11/05/2022   Procedure: RIGHT BREAST LUMPECTOMY;  Surgeon:  Griselda Miner, MD;  Location: [facility] OR;  Service: General;  Laterality: Right;   EYE SURGERY     LAPAROSCOPIC APPENDECTOMY N/A 08/18/2022   Procedure: APPENDECTOMY LAPAROSCOPIC;  Surgeon: Darnell Level, MD;  Location: [facility] ORS;   Service: General;  Laterality: N/A;   TONSILLECTOMY       ALLERGIES:  Allergies  Allergen Reactions   Doxycycline Calcium Other (See Comments)    Per Palmer, "Intestines do not tolerate very well" = GI upset   Penicillin V Nausea Only and Rash     CURRENT MEDICATIONS:  Outpatient Encounter Medications as of 03/29/2023  Medication Sig Note   aspirin 81 MG tablet Take 81 mg by mouth daily at 12 noon.    b complex vitamins capsule Take 1 capsule by mouth daily.    bacitracin-polymyxin b (POLYSPORIN) ointment Apply 1 Application topically 2 (two) times daily as needed (wound care).    CALCIUM-MAGNESIUM-VITAMIN D PO Take 1 tablet by mouth daily.    cetirizine (ZYRTEC) 10 MG tablet Take 10 mg by mouth daily as needed for allergies or rhinitis.    diltiazem (DILACOR XR) 180 MG 24 hr capsule Take 180 mg by mouth in [place] morning.    hydroxyurea (HYDREA) 500 MG capsule Take 1 capsule (500 mg total) by mouth daily. May take with food to minimize GI side effects.    letrozole (FEMARA) 2.5 MG tablet Take 1 tablet (2.5 mg total) by mouth daily.    losartan (COZAAR) 100 MG tablet TAKE 1 TABLET BY MOUTH DAILY (Palmer taking differently: Take 50 mg by mouth daily as needed (high blood pressure).)    methimazole (TAPAZOLE) 5 MG tablet Take 5 mg by mouth in [place] morning.    potassium chloride SA (KLOR-CON M) 20 MEQ tablet TAKE 1 TABLET BY MOUTH TWICE DAILY (Palmer taking differently: Take 20 mEq by mouth daily at 12 noon.)    Psyllium (METAMUCIL PO) Take 1 Scoop by mouth daily.    timolol (TIMOPTIC) 0.5 % ophthalmic solution Place 1 drop into both eyes in [place] morning.    torsemide (DEMADEX) 20 MG tablet Take 1 tablet (20 mg total) by mouth 2 (two) times daily. 10/30/2022: Pt may skip doses if she has to leave [place] house    [DISCONTINUED] oxyCODONE (ROXICODONE) 5 MG immediate release tablet Take 1 tablet (5 mg total) by mouth every 6 (six) hours as needed for severe pain.    No facility-administered  encounter medications on file as of 03/29/2023.     ONCOLOGIC FAMILY HISTORY:  Family History  Problem Relation Age of Onset   Heart disease Father    Cancer Son    Cancer Maternal Aunt    Cancer Paternal Uncle    Cancer Maternal Grandfather    Breast cancer Neg Hx      SOCIAL HISTORY:  Social History   Socioeconomic History   Marital status: Married    Spouse name: Not on file   Number of children: 2   Years of education: Not on file   Highest education level: Not on file  Occupational History   Not on file  Tobacco Use   Smoking status: Never   Smokeless tobacco: Never  Vaping Use   Vaping status: Never Used  Substance and Sexual Activity   Alcohol use: No   Drug use: Never   Sexual activity: Not on file  Other Topics Concern   Not on file  Social History Narrative   Not on file   Social Determinants  of Health   Financial Resource Strain: Not on file  Food Insecurity: No Food Insecurity (08/18/2022)   Hunger Vital Sign    Worried About Running Out of Food in [place] Last Year: Never true    Ran Out of Food in [place] Last Year: Never true  Transportation Needs: No Transportation Needs (08/18/2022)   PRAPARE - Administrator, Civil Service (Medical): No    Lack of Transportation (Non-Medical): No  Physical Activity: Not on file  Stress: Not on file  Social Connections: Unknown (09/02/2021)   [facility]   Social Network    Social Network: Not on file  Intimate Partner Violence: Not At Risk (08/18/2022)   Humiliation, Afraid, Rape, and Kick questionnaire    Fear of Current or Ex-Partner: No    Emotionally Abused: No    Physically Abused: No    Sexually Abused: No     OBSERVATIONS/OBJECTIVE:  BP (!) 146/53 (BP Location: Left Arm, Palmer Position: Sitting)   Pulse (!) 59   Temp 97.7 F (36.5 C) (Tympanic)   Resp 17   Ht 5' (1.524 m)   Wt 95 lb 14.4 oz (43.5 kg)   SpO2 97%   BMI 18.73 kg/m  GENERAL: Palmer is a  well appearing female in no acute distress HEENT:  Sclerae anicteric.  Oropharynx clear and moist. No ulcerations or evidence of oropharyngeal candidiasis. Neck is supple.  NODES:  No cervical, supraclavicular, or axillary lymphadenopathy palpated.  BREAST EXAM:  right breast s/p lumpectomy.  Scar tissue present, no sign of local recurrence, left breast benign LUNGS:  Clear to auscultation bilaterally.  No wheezes or rhonchi. HEART:  Regular rate and rhythm. No murmur appreciated. ABDOMEN:  Soft, nontender.  Positive, normoactive bowel sounds. No organomegaly palpated. MSK:  No focal spinal tenderness to palpation. Full range of motion bilaterally in [place] upper extremities. EXTREMITIES:  No peripheral edema.   SKIN:  Clear with no obvious rashes or skin changes. No nail dyscrasia. NEURO:  Nonfocal. Well oriented.  Appropriate affect.   LABORATORY DATA:  None for this visit.  DIAGNOSTIC IMAGING:  None for this visit.      ASSESSMENT AND PLAN:  Ms.. Palmer is a pleasant 89 y.o. female with Stage IA right breast invasive ductal carcinoma, ER+/PR+/HER2-, diagnosed in 08/2022, treated with lumpectomy and anti-estrogen therapy with Letrozole beginning in 12/2022.  [facility] for our initial meeting and routine follow-up post-completion of treatment for breast cancer.    1. Stage IA right breast cancer:  Ms. Lovullo is continuing to recover from definitive treatment for breast cancer. She will follow-up with her medical oncologist, Dr.  Candise Che in 05/2023 with history and physical exam per surveillance protocol.  She will continue her anti-estrogen therapy with Letrozole. Thus far, she is tolerating [place] Letrozole well, with minimal side effects. Her mammogram is due 08/2023; orders placed today.   Today, a comprehensive survivorship care plan and treatment summary was reviewed with [name] today detailing her breast cancer diagnosis, treatment course, potential  late/long-term effects of treatment, appropriate follow-up care with recommendations for [place]yenne future, and Palmer education resources.  A copy of this summary, along with a letter will be sent to [name]'s primary care provider via mail/fax/In Basket message after today's visit.    2. Bone health:  Given Ms. Gens age/history of breast cancer and her current treatment regimen including anti-estrogen therapy with Letrozole, she is at risk for bone demineralization.  She is scheduled for repeat bone density testing in 06/2023.  Dr. Candise Che has discussed prolia with her.  She was given education on specific activities to promote bone health.  3. Cancer screening:  Due to Ms. Maunu history and her age, she should receive screening for skin cancers.  Cheyenne information and recommendations are listed on [name]'s comprehensive care plan/treatment summary and were reviewed in detail with [name].    4. Health maintenance and wellness promotion: Ms. Liggon was encouraged to consume 5-7 servings of fruits and vegetables per day. We reviewed [place]yenne "Nutrition Rainbow" handout.  She was also encouraged to engage in moderate to vigorous exercise for 30 minutes per day most days of [place] week.  She was instructed to limit her alcohol consumption and continue to abstain from tobacco use.  5. Support services/counseling: It is not uncommon for this period of [name]'s cancer care trajectory to be one of many emotions and stressors.   She was given information regarding our available services and encouraged to contact me with any questions or for help enrolling in any of our support group/programs.    Follow up instructions:    -Return to cancer center as scheduled for f/u with Dr. Candise Che  -Mammogram due in 08/2023 -She is welcome to return back to [facility] at any time; no additional follow-up needed at this time.  -Consider referral back to survivorship as a long-term survivor for continued  surveillance  [name] was provided an opportunity to ask questions and all were answered. [name] agreed with Cheyenne plan and demonstrated an understanding of Chey[place] instructions.   Total encounter time:40 minutes*in face-to-face visit time, chart review, lab review, care coordination, order entry, and documentation of Chey[place] encounter time.    Lillard Anes, NP 03/29/23 1:15 PM Medical Oncology and Hematology [facility] [address] [place], [place] [address] Tel. [phone]    Fax. [phone]  *Total Encounter Time as defined by [place] [facility] for Medicare and Medicaid Services includes, in addition to Cheyenne face-to-face time of a Palmer visit (documented in Cheyenne note above) non-face-to-face time: obtaining and reviewing outside history, ordering and reviewing medications, tests or procedures, care coordination (communications with other health care professionals or caregivers) and documentation in [place] medical record.

## 2023-03-30 DIAGNOSIS — H53143 Visual discomfort, bilateral: Secondary | ICD-10-CM | POA: Diagnosis not present

## 2023-03-30 DIAGNOSIS — H18231 Secondary corneal edema, right eye: Secondary | ICD-10-CM | POA: Diagnosis not present

## 2023-03-30 DIAGNOSIS — H353221 Exudative age-related macular degeneration, left eye, with active choroidal neovascularization: Secondary | ICD-10-CM | POA: Diagnosis not present

## 2023-03-30 DIAGNOSIS — H3562 Retinal hemorrhage, left eye: Secondary | ICD-10-CM | POA: Diagnosis not present

## 2023-03-30 DIAGNOSIS — H18421 Band keratopathy, right eye: Secondary | ICD-10-CM | POA: Diagnosis not present

## 2023-04-07 ENCOUNTER — Other Ambulatory Visit (HOSPITAL_COMMUNITY): Payer: Self-pay

## 2023-04-07 ENCOUNTER — Other Ambulatory Visit: Payer: Self-pay | Admitting: Hematology

## 2023-04-07 MED ORDER — HYDROXYUREA 500 MG PO CAPS
500.0000 mg | ORAL_CAPSULE | Freq: Every day | ORAL | 1 refills | Status: DC
  Filled 2023-04-07: qty 30, 30d supply, fill #0
  Filled 2023-05-14: qty 30, 30d supply, fill #1

## 2023-04-09 ENCOUNTER — Other Ambulatory Visit (HOSPITAL_COMMUNITY): Payer: Self-pay

## 2023-04-17 ENCOUNTER — Other Ambulatory Visit: Payer: Self-pay | Admitting: Cardiology

## 2023-04-17 DIAGNOSIS — I739 Peripheral vascular disease, unspecified: Secondary | ICD-10-CM

## 2023-04-27 ENCOUNTER — Encounter: Payer: Self-pay | Admitting: Cardiology

## 2023-04-27 ENCOUNTER — Ambulatory Visit: Payer: Medicare PPO | Attending: Cardiology | Admitting: Cardiology

## 2023-04-27 VITALS — BP 100/64 | HR 55 | Resp 16 | Ht 60.0 in | Wt 93.4 lb

## 2023-04-27 DIAGNOSIS — I361 Nonrheumatic tricuspid (valve) insufficiency: Secondary | ICD-10-CM | POA: Insufficient documentation

## 2023-04-27 DIAGNOSIS — I10 Essential (primary) hypertension: Secondary | ICD-10-CM

## 2023-04-27 NOTE — Progress Notes (Signed)
 Cardiology Office Note:  .   Date:  04/27/2023  ID:  [name], DOB Jul 12, 1932, MRN [id number] PCP: Vernadine [name], MD  [facility] Providers Cardiologist:  Newman Lawrence, MD PCP: Vernadine [name], MD  Chief Complaint  Patient presents with   Secondary pulmonary arterial hypertension   Follow-up      History of Present Illness: .    [name] is a 89 y.o. 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  They have recently moved to a friend[place] home [place] community.  Patient is doing fairly well.  Leg edema is controlled when she takes torsemide .  She has not taken it today, usually avoids taking it when she is going outside.  She has occasional palpitation symptoms, roughly once a month or so.  Vitals:   04/27/23 1146  BP: 100/64  Pulse: (!) 55  Resp: 16  SpO2: 96%     ROS:  Review of Systems  Cardiovascular:  Positive for palpitations. Negative for chest pain, dyspnea on exertion, leg swelling and syncope.     Studies Reviewed: SABRA        EKG 04/27/2023: Sinus rhythm with sinus arrhythmia Incomplete right bundle branch block When compared with ECG of 28-Oct-2005 14:13, Incomplete right bundle branch block is now Present     Independently interpreted 02/2023: Hb 13.6 Cr 1.35  Echocardiogram 03/15/2023: EF 60-65% Mild RV dilatation, normal RV systolic function. Aortic sclerosis, mild AI. Moderate to severe TR. Estimated PASP 44 mmHg.    Physical Exam:   Physical Exam Vitals and nursing note reviewed.  Constitutional:      General: She is not in acute distress. Neck:     Vascular: No JVD.  Cardiovascular:     Rate and Rhythm: Normal rate and regular rhythm.     Heart sounds: Murmur heard.     High-pitched blowing holosystolic murmur is present at  the upper left sternal border.  Pulmonary:     Effort: Pulmonary effort is normal.     Breath sounds: Normal breath sounds. No wheezing or rales.  Musculoskeletal:     Right lower leg: Edema (1+) present.     Left lower leg: Edema (1+) present.      VISIT DIAGNOSES:   ICD-10-CM   1. Nonrheumatic tricuspid valve regurgitation  I36.1 EKG 12-Lead    ECHOCARDIOGRAM COMPLETE    2. Primary hypertension  I10        ASSESSMENT AND PLAN: .    [name] is a 89 y.o. female with hypertension, CKD 3a, h/o breast cancer, moderate to severe tricuspid regurgitation, pulmonary hypertension, multinodular goiter w/h/o thyrotoxicosis, polycythemia vera   Pulmonary hypertension: Longstanding pulmonary hypertension, could either be WHO group 1 or 2.  Her history of polycythemia vera makes associated pulmonary hypertension likely.  Other than leg edema, she denies any symptoms.  Recent echocardiogram in 03/2023 shows PASP 44 mmHg, lower than before, without RV systolic dysfunction. Continue torsemide  20 mg bid.  She wants to hold of RHC, which is very reasonable at her age and relative lack of symptoms.   Hypertension: Blood pressure also low normal today.  Reduce losartan  from 100 mg daily, to 50 mg daily.  PAD: Mild PAD LLE. Continue risk factor modification In absence of bleeding, okay to continue aspirin.   Not on statin, lipids well controlled.  F/u in 6 months with repeat echocardiogram  Signed, [name], MD

## 2023-04-27 NOTE — Patient Instructions (Signed)
 Medication Instructions:   Your physician recommends that you continue on your current medications as directed. Please refer to the Current Medication list given to you today.  *If you need a refill on your cardiac medications before your next appointment, please call your pharmacy*     Testing/Procedures:  Your physician has requested that you have an echocardiogram. Echocardiography is a painless test that uses sound waves to create images of your heart. It provides your doctor with information about the size and shape of your heart and how well your heart's chambers and valves are working. This procedure takes approximately one hour. There are no restrictions for this procedure.  SCHEDULE ECHO IN 6 MONTHS PER DR. PATWARDHAN   Please do NOT wear cologne, perfume, aftershave, or lotions (deodorant is allowed). Please arrive 15 minutes prior to your appointment time.  Please note: We ask at that you not bring children with you during ultrasound (echo/ vascular) testing. Due to room size and safety concerns, children are not allowed in the ultrasound rooms during exams. Our front office staff cannot provide observation of children in our lobby area while testing is being conducted. An adult accompanying a patient to their appointment will only be allowed in the ultrasound room at the discretion of the ultrasound technician under special circumstances. We apologize for any inconvenience.    Follow-Up: At [facility], you and your health needs are our priority.  As part of our continuing mission to provide you with exceptional heart care, we have created designated Provider Care Teams.  These Care Teams include your primary Cardiologist (physician) and Advanced Practice Providers (APPs -  Physician Assistants and Nurse Practitioners) who all work together to provide you with the care you need, when you need it.  We recommend signing up for the patient portal called MyChart.  Sign up  information is provided on this After Visit Summary.  MyChart is used to connect with patients for Virtual Visits (Telemedicine).  Patients are able to view lab/test results, encounter notes, upcoming appointments, etc.  Non-urgent messages can be sent to your provider as well.   To learn more about what you can do with MyChart, go to [email or web address].    Your next appointment:   6 month(s)  Provider:   DR. PATWARDHAN

## 2023-05-25 DIAGNOSIS — H18231 Secondary corneal edema, right eye: Secondary | ICD-10-CM | POA: Diagnosis not present

## 2023-05-25 DIAGNOSIS — H3562 Retinal hemorrhage, left eye: Secondary | ICD-10-CM | POA: Diagnosis not present

## 2023-05-25 DIAGNOSIS — H353221 Exudative age-related macular degeneration, left eye, with active choroidal neovascularization: Secondary | ICD-10-CM | POA: Diagnosis not present

## 2023-05-25 DIAGNOSIS — H18421 Band keratopathy, right eye: Secondary | ICD-10-CM | POA: Diagnosis not present

## 2023-05-25 DIAGNOSIS — H53143 Visual discomfort, bilateral: Secondary | ICD-10-CM | POA: Diagnosis not present

## 2023-05-25 DIAGNOSIS — H401120 Primary open-angle glaucoma, left eye, stage unspecified: Secondary | ICD-10-CM | POA: Diagnosis not present

## 2023-05-26 ENCOUNTER — Other Ambulatory Visit: Payer: Self-pay | Admitting: Cardiology

## 2023-05-26 DIAGNOSIS — I2721 Secondary pulmonary arterial hypertension: Secondary | ICD-10-CM

## 2023-05-26 DIAGNOSIS — I10 Essential (primary) hypertension: Secondary | ICD-10-CM

## 2023-05-31 ENCOUNTER — Other Ambulatory Visit: Payer: Self-pay

## 2023-05-31 DIAGNOSIS — C50919 Malignant neoplasm of unspecified site of unspecified female breast: Secondary | ICD-10-CM

## 2023-06-01 ENCOUNTER — Inpatient Hospital Stay: Payer: Medicare PPO | Attending: Hematology

## 2023-06-01 ENCOUNTER — Inpatient Hospital Stay: Payer: Medicare PPO | Admitting: Hematology

## 2023-06-01 VITALS — BP 146/53 | HR 60 | Temp 97.2°F | Resp 16 | Wt 96.4 lb

## 2023-06-01 DIAGNOSIS — D45 Polycythemia vera: Secondary | ICD-10-CM

## 2023-06-01 DIAGNOSIS — Z170 Estrogen receptor positive status [ER+]: Secondary | ICD-10-CM

## 2023-06-01 DIAGNOSIS — D7589 Other specified diseases of blood and blood-forming organs: Secondary | ICD-10-CM | POA: Insufficient documentation

## 2023-06-01 DIAGNOSIS — C50411 Malignant neoplasm of upper-outer quadrant of right female breast: Secondary | ICD-10-CM | POA: Insufficient documentation

## 2023-06-01 DIAGNOSIS — C50911 Malignant neoplasm of unspecified site of right female breast: Secondary | ICD-10-CM | POA: Diagnosis not present

## 2023-06-01 DIAGNOSIS — C50919 Malignant neoplasm of unspecified site of unspecified female breast: Secondary | ICD-10-CM

## 2023-06-01 DIAGNOSIS — Z79811 Long term (current) use of aromatase inhibitors: Secondary | ICD-10-CM | POA: Insufficient documentation

## 2023-06-01 LAB — CMP (CANCER CENTER ONLY)
ALT: 21 U/L (ref 0–44)
AST: 28 U/L (ref 15–41)
Albumin: 4.1 g/dL (ref 3.5–5.0)
Alkaline Phosphatase: 85 U/L (ref 38–126)
Anion gap: 5 (ref 5–15)
BUN: 21 mg/dL (ref 8–23)
CO2: 35 mmol/L — ABNORMAL HIGH (ref 22–32)
Calcium: 9.6 mg/dL (ref 8.9–10.3)
Chloride: 101 mmol/L (ref 98–111)
Creatinine: 1.1 mg/dL — ABNORMAL HIGH (ref 0.44–1.00)
GFR, Estimated: 48 mL/min — ABNORMAL LOW (ref >60)
Glucose, Bld: 118 mg/dL — ABNORMAL HIGH (ref 70–99)
Potassium: 3.6 mmol/L (ref 3.5–5.1)
Sodium: 141 mmol/L (ref 135–145)
Total Bilirubin: 0.6 mg/dL (ref 0.0–1.2)
Total Protein: 7 g/dL (ref 6.5–8.1)

## 2023-06-01 LAB — CBC WITH DIFFERENTIAL (CANCER CENTER ONLY)
Abs Immature Granulocytes: 0.04 10*3/uL (ref 0.00–0.07)
Basophils Absolute: 0.1 10*3/uL (ref 0.0–0.1)
Basophils Relative: 1 %
Eosinophils Absolute: 0.2 10*3/uL (ref 0.0–0.5)
Eosinophils Relative: 3 %
HCT: 40.7 % (ref 36.0–46.0)
Hemoglobin: 13.4 g/dL (ref 12.0–15.0)
Immature Granulocytes: 0 %
Lymphocytes Relative: 15 %
Lymphs Abs: 1.4 10*3/uL (ref 0.7–4.0)
MCH: 38.7 pg — ABNORMAL HIGH (ref 26.0–34.0)
MCHC: 32.9 g/dL (ref 30.0–36.0)
MCV: 117.6 fL — ABNORMAL HIGH (ref 80.0–100.0)
Monocytes Absolute: 0.7 10*3/uL (ref 0.1–1.0)
Monocytes Relative: 8 %
Neutro Abs: 6.8 10*3/uL (ref 1.7–7.7)
Neutrophils Relative %: 73 %
Platelet Count: 482 10*3/uL — ABNORMAL HIGH (ref 150–400)
RBC: 3.46 MIL/uL — ABNORMAL LOW (ref 3.87–5.11)
RDW: 14.2 % (ref 11.5–15.5)
WBC Count: 9.3 10*3/uL (ref 4.0–10.5)
nRBC: 0 % (ref 0.0–0.2)

## 2023-06-01 LAB — LACTATE DEHYDROGENASE: LDH: 207 U/L — ABNORMAL HIGH (ref 98–192)

## 2023-06-01 NOTE — Progress Notes (Signed)
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   Patient was last seen by me on 02/19/2023 and she was doing well overall.   Patient is accompanied by her husband during this visit. She notes she has been doing well overall since our last visit. She notes that they have moved into independent living facility.   Patient notes that she recently had Norovirus around 1-2 months ago, which caused her to lose some weight.   She denies any new fever, chills, night sweats, back pain, chest pain, abdominal pain, enlarged lymph nodes, bone pain, or leg swelling. She does complain of chronic neck and bilateral shoulder pain.   She takes Letrozole regularly. Patient reports that she is tolerating Letrozole well without any new or severe toxicities. She has been tolerating her current hydroxyurea dosage well without any new or severe toxicities.  Patient notes that she missed few hydroxyurea dosage in December when she was moving to her independent living facility. This is the reason her platelets are slightly elevated during this visit.   MEDICAL HISTORY:  Past Medical History:  Diagnosis Date   Arthritis    Hypertension    Mitral valve prolapse    Osteopenia    Polycythemia vera (HCC)    Pulmonary hypertension (HCC)    Thyroid disease    Varicose veins VENOUS INSUFFICIENCY SLOW HEALING OF WOUNDS [name] TO LOWER EXTEMETIES   Venous stasis dermatitis      SURGICAL HISTORY: Past Surgical History:  Procedure Laterality Date   BREAST BIOPSY Right 09/04/2022   [place] RT BREAST BX W LOC DEV 1ST LESION IMG BX SPEC US GUIDE 09/04/2022 [facility] M[facility]MOGRAP[facility]   BREAST CYST ASPIRATION Left    BREAST LUMPECTOMY Right 11/05/2022   Procedure: RIGHT BREAST LUMPECTOMY;  Surgeon: Griselda Miner, MD;  Location: [facility];  Service: General;  Laterality: Right;   EYE SURGERY     LAPAROSCOPIC APPENDECTOMY N/A 08/18/2022   Procedure: APPENDECTOMY LAPAROSCOPIC;  Surgeon: Darnell Level, MD;  Location: [facility];  Service: General;  Laterality: N/A;   TONSILLECTOMY      SOCIAL HISTORY: Social History   Socioeconomic History   Marital status: Married    Spouse name: Not on file   Number of children: 2   Years of education: Not on file   Highest education level: Not on file  Occupational History   Not on file  Tobacco Use   Smoking status: Never   Smokeless tobacco: Never  Vaping Use   Vaping status: Never Used  Substance and Sexual Activity   Alcohol use: No   Drug use: Never   Sexual activity: Not on file  Other Topics Concern   Not on file  Social History Narrative   Not on file   Social Drivers of Health   Financial Resource Strain: Not on file  Food Insecurity: No Food Insecurity (08/18/2022)   Hunger Vital Sign    Worried About Running Out of Food in the Last Year: Never true    Ran Out of  Food in the Last Year: Never true  Transportation Needs: No Transportation Needs (08/18/2022)   PRAPARE - Administrator, Civil Service (Medical): No    Lack of Transportation (Non-Medical): No  Physical Activity: Not on file  Stress: Not on file  Social Connections: Unknown (09/02/2021)   [facility] from [facility]   Social Network    Social Network: Not on file  Intimate Partner Violence: Not At Risk (08/18/2022)   Humiliation, Afraid, Rape, and Kick questionnaire    Fear of Current or Ex-Partner: No    Emotionally Abused: No     Physically Abused: No    Sexually Abused: No    FAMILY HISTORY: Family History  Problem Relation Age of Onset   Heart disease Father    Cancer Son    Cancer Maternal Aunt    Cancer Paternal Uncle    Cancer Maternal Grandfather    Breast cancer Neg Hx     ALLERGIES:  is allergic to doxycycline calcium and penicillin v.  MEDICATIONS:  Current Outpatient Medications  Medication Sig Dispense Refill   aspirin 81 MG tablet Take 81 mg by mouth daily at 12 noon.     b complex vitamins capsule Take 1 capsule by mouth daily.     bacitracin-polymyxin b (POLYSPORIN) ointment Apply 1 Application topically 2 (two) times daily as needed (wound care).     CALCIUM-MAGNESIUM-VITAMIN D PO Take 1 tablet by mouth daily.     cetirizine (ZYRTEC) 10 MG tablet Take 10 mg by mouth daily as needed for allergies or rhinitis.     diltiazem (DILACOR XR) 180 MG 24 hr capsule Take 180 mg by mouth in the morning.     hydroxyurea (HYDREA) 500 MG capsule Take 1 capsule (500 mg total) by mouth daily. May take with food to minimize GI side effects. 30 capsule 1   letrozole (FEMARA) 2.5 MG tablet Take 1 tablet (2.5 mg total) by mouth daily. 30 tablet 5   losartan (COZAAR) 100 MG tablet TAKE 1 TABLET BY MOUTH DAILY (Patient taking differently: Take 50 mg by mouth daily as needed (high blood pressure).) 90 tablet 3   methimazole (TAPAZOLE) 5 MG tablet Take 5 mg by mouth in the morning.     potassium chloride SA (KLOR-CON M) 20 MEQ tablet TAKE 1 TABLET BY MOUTH TWICE DAILY 180 tablet 1   Psyllium (METAMUCIL PO) Take 1 Scoop by mouth daily.     timolol (TIMOPTIC) 0.5 % ophthalmic solution Place 1 drop into both eyes in the morning.     torsemide (DEMADEX) 20 MG tablet TAKE 1 TABLET BY MOUTH TWICE DAILY 180 tablet 3   No current facility-administered medications for this visit.    REVIEW OF SYSTEMS:    10 Point review of Systems was done is negative except as noted above.   PHYSICAL EXAMINATION: ECOG PERFORMANCE  STATUS: 2  . Vitals:   06/01/23 1258  BP: (!) 146/53  Pulse: 60  Resp: 16  Temp: (!) 97.2 F (36.2 C)  SpO2: 97%    Filed Weights   06/01/23 1258  Weight: 96 lb 6.4 oz (43.7 kg)   .Body mass index is 18.83 kg/m.  GENERAL:alert, in no acute distress and comfortable SKIN: no acute rashes, no significant lesions EYES: conjunctiva are pink and non-injected, sclera anicteric OROPHARYNX: MMM, no exudates, no oropharyngeal erythema or ulceration NECK: supple, no JVD LYMPH:  no palpable lymphadenopathy in the cervical, axillary or inguinal regions LUNGS: clear to auscultation  b/l with normal respiratory effort HEART: regular rate & rhythm ABDOMEN:  normoactive bowel sounds , non tender, not distended. Extremity: no pedal edema PSYCH: alert & oriented x 3 with fluent speech NEURO: no focal motor/sensory deficits   LABORATORY DATA:  I have reviewed the data as listed     Latest Ref Rng & Units 06/01/2023   11:56 AM 02/19/2023   11:34 AM 01/05/2023    2:39 PM  CBC  WBC 4.0 - 10.5 K/uL 9.3  8.1  7.6   Hemoglobin 12.0 - 15.0 g/dL 27.2  53.6  64.4   Hematocrit 36.0 - 46.0 % 40.7  40.9  43.3   Platelets 150 - 400 K/uL 482  424  413     .    Latest Ref Rng & Units 06/01/2023   11:56 AM 02/19/2023   11:34 AM 01/05/2023    2:39 PM  CMP  Glucose 70 - 99 mg/dL 034  90  742   BUN 8 - 23 mg/dL 21  27  29    Creatinine 0.44 - 1.00 mg/dL 5.95  6.38  7.56   Sodium 135 - 145 mmol/L 141  141  143   Potassium 3.5 - 5.1 mmol/L 3.6  3.7  3.8   Chloride 98 - 111 mmol/L 101  100  102   CO2 22 - 32 mmol/L 35  37  36   Calcium 8.9 - 10.3 mg/dL 9.6  9.8  9.8   Total Protein 6.5 - 8.1 g/dL 7.0  7.1  7.4   Total Bilirubin 0.0 - 1.2 mg/dL 0.6  0.7  0.7   Alkaline Phos 38 - 126 U/L 85  87  98   AST 15 - 41 U/L 28  26  29    ALT 0 - 44 U/L 21  18  21     . Lab Results  Component Value Date   LDH 197 (H) 02/19/2023      RADIOGRAPHIC STUDIES: I have personally reviewed the radiological  images as listed and agreed with the findings in the report. No results found.  06/24/2020 JAK2    11/05/2022 right breast lumpectomy:     ASSESSMENT & PLAN:   89 y.o. 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  PLAN:  -Discussed lab results from today, 06/01/2023, in detail with the patient. CBC shows elevated Platelets at 482 K. CMP shows elevated but improved creatinine of 1.10.  -Bone density is scheduled in March.  -Patient reports no significant toxicity from her current dose of hydroxyurea. -Continue hydroxyurea at 500 mg p.o. daily with food -Continue Letrozole same dosage.  -recommend optimizing calcium and vitamin D -continue vitamin D replacement -discussed option of starting prolia injection every 6 months for bone streghtening due to significant osteopenia to reduce the risk of fractures by 25-30% and counteract some effects of Letrozole in the bones. She wants to wait on Prolia injection until bone density in March.  -answered all of patient's questions in detail  FOLLOW-UP: RTC with Dr Candise Che with labs in 4 months  The total time spent in the appointment was 30 minutes* .  All of the patient's questions were answered with apparent satisfaction. The patient knows to call the clinic with any problems, questions or concerns.   Wyvonnia Lora MD MS AAHIVMS [facility] Hematology/Oncology  Physician [facility]  .*Total Encounter Time as defined by the Centers for Medicare and Medicaid Services includes, in addition to the face-to-face time of a patient visit (documented in the note above) non-face-to-face time: obtaining and reviewing outside history, ordering and reviewing medications, tests or procedures, care coordination (communications with other health care  professionals or caregivers) and documentation in the medical record.   I,Param Shah,acting as a Neurosurgeon for Wyvonnia Lora, MD.,have documented all relevant documentation on the behalf of Wyvonnia Lora, MD,as directed by  Wyvonnia Lora, MD while in the presence of Wyvonnia Lora, MD.   .I have reviewed the above documentation for accuracy and completeness, and I agree with the above. Johney Maine MD

## 2023-06-02 ENCOUNTER — Telehealth: Payer: Self-pay | Admitting: Hematology

## 2023-06-02 NOTE — Telephone Encounter (Signed)
Spoke with patient confirming upcoming appointment

## 2023-06-14 ENCOUNTER — Other Ambulatory Visit: Payer: Self-pay | Admitting: Hematology

## 2023-06-14 ENCOUNTER — Other Ambulatory Visit (HOSPITAL_COMMUNITY): Payer: Self-pay

## 2023-06-14 MED ORDER — HYDROXYUREA 500 MG PO CAPS
500.0000 mg | ORAL_CAPSULE | Freq: Every day | ORAL | 1 refills | Status: DC
  Filled 2023-06-14: qty 30, 30d supply, fill #0
  Filled 2023-07-16: qty 30, 30d supply, fill #1

## 2023-06-29 ENCOUNTER — Ambulatory Visit
Admission: RE | Admit: 2023-06-29 | Discharge: 2023-06-29 | Disposition: A | Discharge status: Home / Self Care | Payer: Medicare PPO | Source: Ambulatory Visit | Attending: Hematology

## 2023-06-29 DIAGNOSIS — E2839 Other primary ovarian failure: Secondary | ICD-10-CM | POA: Diagnosis not present

## 2023-06-29 DIAGNOSIS — C50911 Malignant neoplasm of unspecified site of right female breast: Secondary | ICD-10-CM

## 2023-06-29 DIAGNOSIS — M8588 Other specified disorders of bone density and structure, other site: Secondary | ICD-10-CM | POA: Diagnosis not present

## 2023-06-29 DIAGNOSIS — N958 Other specified menopausal and perimenopausal disorders: Secondary | ICD-10-CM | POA: Diagnosis not present

## 2023-07-08 DIAGNOSIS — J449 Chronic obstructive pulmonary disease, unspecified: Secondary | ICD-10-CM | POA: Diagnosis not present

## 2023-07-08 DIAGNOSIS — H6121 Impacted cerumen, right ear: Secondary | ICD-10-CM | POA: Diagnosis not present

## 2023-07-08 DIAGNOSIS — E0520 Thyrotoxicosis with toxic multinodular goiter without thyrotoxic crisis or storm: Secondary | ICD-10-CM | POA: Diagnosis not present

## 2023-07-08 DIAGNOSIS — I272 Pulmonary hypertension, unspecified: Secondary | ICD-10-CM | POA: Diagnosis not present

## 2023-07-08 DIAGNOSIS — I1310 Hypertensive heart and chronic kidney disease without heart failure, with stage 1 through stage 4 chronic kidney disease, or unspecified chronic kidney disease: Secondary | ICD-10-CM | POA: Diagnosis not present

## 2023-07-08 DIAGNOSIS — D471 Chronic myeloproliferative disease: Secondary | ICD-10-CM | POA: Diagnosis not present

## 2023-07-08 DIAGNOSIS — N1831 Chronic kidney disease, stage 3a: Secondary | ICD-10-CM | POA: Diagnosis not present

## 2023-07-08 DIAGNOSIS — C50911 Malignant neoplasm of unspecified site of right female breast: Secondary | ICD-10-CM | POA: Diagnosis not present

## 2023-07-16 ENCOUNTER — Other Ambulatory Visit: Payer: Self-pay | Admitting: Hematology

## 2023-08-03 DIAGNOSIS — H401120 Primary open-angle glaucoma, left eye, stage unspecified: Secondary | ICD-10-CM | POA: Diagnosis not present

## 2023-08-03 DIAGNOSIS — H353221 Exudative age-related macular degeneration, left eye, with active choroidal neovascularization: Secondary | ICD-10-CM | POA: Diagnosis not present

## 2023-08-03 DIAGNOSIS — H18421 Band keratopathy, right eye: Secondary | ICD-10-CM | POA: Diagnosis not present

## 2023-08-03 DIAGNOSIS — H18231 Secondary corneal edema, right eye: Secondary | ICD-10-CM | POA: Diagnosis not present

## 2023-08-18 ENCOUNTER — Other Ambulatory Visit: Payer: Self-pay | Admitting: Hematology

## 2023-08-18 ENCOUNTER — Other Ambulatory Visit (HOSPITAL_COMMUNITY): Payer: Self-pay

## 2023-08-18 MED ORDER — HYDROXYUREA 500 MG PO CAPS
500.0000 mg | ORAL_CAPSULE | Freq: Every day | ORAL | 1 refills | Status: DC
  Filled 2023-08-18: qty 30, 30d supply, fill #0
  Filled 2023-09-18: qty 30, 30d supply, fill #1

## 2023-09-02 ENCOUNTER — Ambulatory Visit
Admission: RE | Admit: 2023-09-02 | Discharge: 2023-09-02 | Disposition: A | Discharge status: Home / Self Care | Source: Ambulatory Visit | Attending: Adult Health | Admitting: Adult Health

## 2023-09-02 DIAGNOSIS — Z08 Encounter for follow-up examination after completed treatment for malignant neoplasm: Secondary | ICD-10-CM | POA: Diagnosis not present

## 2023-09-02 DIAGNOSIS — Z170 Estrogen receptor positive status [ER+]: Secondary | ICD-10-CM

## 2023-09-02 DIAGNOSIS — Z853 Personal history of malignant neoplasm of breast: Secondary | ICD-10-CM | POA: Diagnosis not present

## 2023-09-28 ENCOUNTER — Ambulatory Visit: Payer: Medicare PPO | Admitting: Hematology

## 2023-09-28 ENCOUNTER — Other Ambulatory Visit: Payer: Medicare PPO

## 2023-10-14 ENCOUNTER — Telehealth: Payer: Self-pay | Admitting: Cardiology

## 2023-10-14 ENCOUNTER — Other Ambulatory Visit: Payer: Self-pay

## 2023-10-14 DIAGNOSIS — R002 Palpitations: Secondary | ICD-10-CM

## 2023-10-14 NOTE — Telephone Encounter (Signed)
 Patient c/o Palpitations:  STAT if patient reporting lightheadedness, shortness of breath, or chest pain  How long have you had palpitations/irregular HR/ Afib? Are you having the symptoms now?   No  Are you currently experiencing lightheadedness, SOB or CP?   No  Do you have a history of afib (atrial fibrillation) or irregular heart rhythm?   Yes  Have you checked your BP or HR? (document readings if available):   125/?(Unknown)   HR 66  Are you experiencing any other symptoms?  No  Patient stated stated she had an episode of rapid HR around 1:30 am this morning.

## 2023-10-14 NOTE — Telephone Encounter (Signed)
 Spoke with pt, last night she got up to the bathroom and there was a bad storm and when she went to get in bed, her heart rate jumped up to 148 bpm. She had no chest pain, dizziness or sweating. About 1 hour later her heart rate was still 125 bpm by her blood pressure machine. The nurse in the facility where she lives came and checked her out and her heart rate was 111 bpm. Everything checked out fine so she went back to bed. This morning her heart rate was still 111 bpm until she ate breakfast and then it dropped to 80 bpm and now it is 64 bpm. She does report in creased anxiety related to several things and they were without air conditioning for 2 and 1/2 days. She drinks plenty of fluid but has increased the amount she drinks because of the weather. Encouraged patient to increase fluid intake, avoiding caffeine, and continue to monitor. She has an echo scheduled for 11/04/23 and follow up was scheduled for dr patwardhan for 11/09/23. Aware will forward to make dr patwardhan aware. Patient voiced understanding to call back if happens again. Today she is feeling fine.

## 2023-10-14 NOTE — Telephone Encounter (Signed)
 Spoke with Pt. EKG visit set for 7/3. Pt requested zio be placed at that time.   Order for zio and request for placement sent.

## 2023-10-14 NOTE — Telephone Encounter (Signed)
 It is likely that she had some sort of arrhythmia. I would recommend an EKG and Zio monitor for 2 weeks that would help us  get more information before her next visit with me. Diagnosis: Palpitations, tachycardia  Thanks MJP

## 2023-10-15 ENCOUNTER — Other Ambulatory Visit: Payer: Self-pay

## 2023-10-15 DIAGNOSIS — C50911 Malignant neoplasm of unspecified site of right female breast: Secondary | ICD-10-CM

## 2023-10-15 DIAGNOSIS — D45 Polycythemia vera: Secondary | ICD-10-CM

## 2023-10-18 ENCOUNTER — Other Ambulatory Visit (HOSPITAL_COMMUNITY): Payer: Self-pay

## 2023-10-18 ENCOUNTER — Other Ambulatory Visit (HOSPITAL_BASED_OUTPATIENT_CLINIC_OR_DEPARTMENT_OTHER): Payer: Self-pay

## 2023-10-18 ENCOUNTER — Inpatient Hospital Stay: Attending: Hematology

## 2023-10-18 ENCOUNTER — Other Ambulatory Visit: Payer: Self-pay | Admitting: Cardiology

## 2023-10-18 ENCOUNTER — Inpatient Hospital Stay: Admitting: Hematology

## 2023-10-18 ENCOUNTER — Other Ambulatory Visit: Payer: Self-pay | Admitting: Hematology

## 2023-10-18 VITALS — BP 152/63 | HR 56 | Temp 97.9°F | Resp 18 | Wt 98.2 lb

## 2023-10-18 DIAGNOSIS — I10 Essential (primary) hypertension: Secondary | ICD-10-CM

## 2023-10-18 DIAGNOSIS — D45 Polycythemia vera: Secondary | ICD-10-CM | POA: Insufficient documentation

## 2023-10-18 DIAGNOSIS — Z170 Estrogen receptor positive status [ER+]: Secondary | ICD-10-CM

## 2023-10-18 DIAGNOSIS — C50911 Malignant neoplasm of unspecified site of right female breast: Secondary | ICD-10-CM

## 2023-10-18 LAB — CBC WITH DIFFERENTIAL (CANCER CENTER ONLY)
Abs Immature Granulocytes: 0.04 10*3/uL (ref 0.00–0.07)
Basophils Absolute: 0.1 10*3/uL (ref 0.0–0.1)
Basophils Relative: 1 %
Eosinophils Absolute: 0.2 10*3/uL (ref 0.0–0.5)
Eosinophils Relative: 3 %
HCT: 44.8 % (ref 36.0–46.0)
Hemoglobin: 14.8 g/dL (ref 12.0–15.0)
Immature Granulocytes: 1 %
Lymphocytes Relative: 14 %
Lymphs Abs: 1.2 10*3/uL (ref 0.7–4.0)
MCH: 37 pg — ABNORMAL HIGH (ref 26.0–34.0)
MCHC: 33 g/dL (ref 30.0–36.0)
MCV: 112 fL — ABNORMAL HIGH (ref 80.0–100.0)
Monocytes Absolute: 0.6 10*3/uL (ref 0.1–1.0)
Monocytes Relative: 7 %
Neutro Abs: 6.3 10*3/uL (ref 1.7–7.7)
Neutrophils Relative %: 74 %
Platelet Count: 544 10*3/uL — ABNORMAL HIGH (ref 150–400)
RBC: 4 MIL/uL (ref 3.87–5.11)
RDW: 14.3 % (ref 11.5–15.5)
WBC Count: 8.5 10*3/uL (ref 4.0–10.5)
nRBC: 0 % (ref 0.0–0.2)

## 2023-10-18 LAB — CMP (CANCER CENTER ONLY)
ALT: 20 U/L (ref 0–44)
AST: 28 U/L (ref 15–41)
Albumin: 4.3 g/dL (ref 3.5–5.0)
Alkaline Phosphatase: 90 U/L (ref 38–126)
Anion gap: 5 (ref 5–15)
BUN: 23 mg/dL (ref 8–23)
CO2: 34 mmol/L — ABNORMAL HIGH (ref 22–32)
Calcium: 10.1 mg/dL (ref 8.9–10.3)
Chloride: 104 mmol/L (ref 98–111)
Creatinine: 1.04 mg/dL — ABNORMAL HIGH (ref 0.44–1.00)
GFR, Estimated: 51 mL/min — ABNORMAL LOW (ref >60)
Glucose, Bld: 88 mg/dL (ref 70–99)
Potassium: 3.9 mmol/L (ref 3.5–5.1)
Sodium: 143 mmol/L (ref 135–145)
Total Bilirubin: 0.7 mg/dL (ref 0.0–1.2)
Total Protein: 7.3 g/dL (ref 6.5–8.1)

## 2023-10-18 LAB — LACTATE DEHYDROGENASE: LDH: 196 U/L — ABNORMAL HIGH (ref 98–192)

## 2023-10-18 MED ORDER — HYDROXYUREA 500 MG PO CAPS
500.0000 mg | ORAL_CAPSULE | Freq: Every day | ORAL | 1 refills | Status: DC
  Filled 2023-10-18: qty 30, 30d supply, fill #0
  Filled 2023-11-15: qty 30, 30d supply, fill #1

## 2023-10-19 NOTE — Progress Notes (Signed)
 HEMATOLOGY/ONCOLOGY CLINIC NOTE  Date of Service: 10/18/2023   Patient Care Team: Palmer, Cheyenne ORN, MD as PCP - General (Internal Medicine) Onesimo Cheyenne Brink, MD as Consulting Physician (Hematology) Cheyenne Deward MOULD, MD as Consulting Physician (General Surgery)  CHIEF COMPLAINTS/PURPOSE OF CONSULTATION:  Follow-up for continued evaluation and management of polycythemia vera  HISTORY OF PRESENTING ILLNESS:   Please see previous notes on initial presentation of disease  INTERVAL HISTORY   [name] is a 89 y.o. here for continued evaluation and management of polycythemia vera.   Patient was last seen by me on 06/01/2023 and reported having a norovirus 1-2 months prior causing weight loss and diarrhea. She notes she has gained back about 5 lbs over the last 6 months. No new breast symptoms/lumps or bumps. No reported toxicities from hydroxyurea . Labs done reviewed.  MEDICAL HISTORY:  Past Medical History:  Diagnosis Date   Arthritis    Hypertension    Mitral valve prolapse    Osteopenia    Polycythemia vera (HCC)    Pulmonary hypertension (HCC)    Thyroid  disease    Varicose veins VENOUS INSUFFICIENCY SLOW HEALING OF WOUNDS [place] TO LOWER EXTEMETIES   Venous stasis dermatitis     SURGICAL HISTORY: Past Surgical History:  Procedure Laterality Date   BREAST BIOPSY Right 09/04/2022   US  RT BREAST BX W LOC DEV 1ST LESION IMG BX SPEC US  GUIDE 09/04/2022 [facility] MAMMOGRAPHY   BREAST CYST ASPIRATION Left    BREAST LUMPECTOMY Right 11/05/2022   Procedure: RIGHT BREAST LUMPECTOMY;  Surgeon: Cheyenne Deward MOULD, MD;  Location: [facility] OR;  Service: General;  Laterality: Right;   EYE SURGERY     LAPAROSCOPIC APPENDECTOMY N/A 08/18/2022   Procedure: APPENDECTOMY LAPAROSCOPIC;  Surgeon: Eletha Boas, MD;  Location: [facility];  Service: General;  Laterality: N/A;   TONSILLECTOMY      SOCIAL HISTORY: Social History   Socioeconomic History   Marital status: Married    Spouse  name: Not on file   Number of children: 2   Years of education: Not on file   Highest education level: Not on file  Occupational History   Not on file  Tobacco Use   Smoking status: Never   Smokeless tobacco: Never  Vaping Use   Vaping status: Never Used  Substance and Sexual Activity   Alcohol  use: No   Drug use: Never   Sexual activity: Not on file  Other Topics Concern   Not on file  Social History Narrative   Not on file   Social Drivers of Health   Financial Resource Strain: Not on file  Food Insecurity: No Food Insecurity (08/18/2022)   Hunger Vital Sign    Worried About Running Out of Food in the Last Year: Never true    Ran Out of Food in the Last Year: Never true  Transportation Needs: No Transportation Needs (08/18/2022)   PRAPARE - Administrator, Civil Service (Medical): No    Lack of Transportation (Non-Medical): No  Physical Activity: Not on file  Stress: Not on file  Social Connections: Unknown (09/02/2021)   Received from [facility]   Social Network    Social Network: Not on file  Intimate Partner Violence: Not At Risk (08/18/2022)   Humiliation, Afraid, Rape, and Kick questionnaire    Fear of Current or Ex-Partner: No    Emotionally Abused: No    Physically Abused: No    Sexually Abused: No    FAMILY HISTORY:  Family History  Problem Relation Age of Onset   Heart disease Father    Cancer Son    Cancer Maternal Aunt    Cancer Paternal Uncle    Cancer Maternal Grandfather    Breast cancer Neg Hx     ALLERGIES:  is allergic to doxycycline calcium  and penicillin v.  MEDICATIONS:  Current Outpatient Medications  Medication Sig Dispense Refill   aspirin 81 MG tablet Take 81 mg by mouth daily at 12 noon.     b complex vitamins capsule Take 1 capsule by mouth daily.     bacitracin-polymyxin b (POLYSPORIN) ointment Apply 1 Application topically 2 (two) times daily as needed (wound care).     CALCIUM -MAGNESIUM-VITAMIN D  PO Take 1 tablet  by mouth daily.     cetirizine (ZYRTEC) 10 MG tablet Take 10 mg by mouth daily as needed for allergies or rhinitis.     diltiazem  (DILACOR XR ) 180 MG 24 hr capsule Take 180 mg by mouth in the morning.     hydroxyurea  (HYDREA ) 500 MG capsule Take 1 capsule (500 mg total) by mouth daily. May take with food to minimize GI side effects. 30 capsule 1   letrozole  (FEMARA ) 2.5 MG tablet TAKE 1 TABLET BY MOUTH DAILY 30 tablet 5   losartan  (COZAAR ) 100 MG tablet TAKE 1 TABLET BY MOUTH DAILY 90 tablet 3   methimazole  (TAPAZOLE ) 5 MG tablet Take 5 mg by mouth in the morning.     potassium chloride  SA (KLOR-CON  M) 20 MEQ tablet TAKE 1 TABLET BY MOUTH TWICE DAILY 180 tablet 1   Psyllium (METAMUCIL PO) Take 1 Scoop by mouth daily.     timolol  (TIMOPTIC ) 0.5 % ophthalmic solution Place 1 drop into both eyes in the morning.     torsemide  (DEMADEX ) 20 MG tablet TAKE 1 TABLET BY MOUTH TWICE DAILY 180 tablet 3   No current facility-administered medications for this visit.    REVIEW OF SYSTEMS:    10 Point review of Systems was done is negative except as noted above.   PHYSICAL EXAMINATION: ECOG PERFORMANCE STATUS: 2  . Vitals:   10/18/23 0950  BP: (!) 152/63  Pulse: (!) 56  Resp: 18  Temp: 97.9 F (36.6 C)  SpO2: 96%    Filed Weights   10/18/23 0950  Weight: 98 lb 3.2 oz (44.5 kg)   .Body mass index is 19.18 kg/m.   GENERAL:alert, in no acute distress and comfortable SKIN: no acute rashes, no significant lesions EYES: conjunctiva are pink and non-injected, sclera anicteric OROPHARYNX: MMM, no exudates, no oropharyngeal erythema or ulceration NECK: supple, no JVD LYMPH:  no palpable lymphadenopathy in the cervical, axillary or inguinal regions LUNGS: clear to auscultation b/l with normal respiratory effort HEART: regular rate & rhythm ABDOMEN:  normoactive bowel sounds , non tender, not distended. No palpable hepatosplenomegaly. Extremity: no pedal edema PSYCH: alert & oriented x 3  with fluent speech NEURO: no focal motor/sensory deficits   LABORATORY DATA:  I have reviewed the data as listed     Latest Ref Rng & Units 10/18/2023    9:40 AM 06/01/2023   11:56 AM 02/19/2023   11:34 AM  CBC  WBC 4.0 - 10.5 K/uL 8.5  9.3  8.1   Hemoglobin 12.0 - 15.0 g/dL 85.1  86.5  86.3   Hematocrit 36.0 - 46.0 % 44.8  40.7  40.9   Platelets 150 - 400 K/uL 544  482  424     .  Latest Ref Rng & Units 10/18/2023    9:40 AM 06/01/2023   11:56 AM 02/19/2023   11:34 AM  CMP  Glucose 70 - 99 mg/dL 88  881  90   BUN 8 - 23 mg/dL 23  21  27    Creatinine 0.44 - 1.00 mg/dL 8.95  8.89  8.64   Sodium 135 - 145 mmol/L 143  141  141   Potassium 3.5 - 5.1 mmol/L 3.9  3.6  3.7   Chloride 98 - 111 mmol/L 104  101  100   CO2 22 - 32 mmol/L 34  35  37   Calcium  8.9 - 10.3 mg/dL 89.8  9.6  9.8   Total Protein 6.5 - 8.1 g/dL 7.3  7.0  7.1   Total Bilirubin 0.0 - 1.2 mg/dL 0.7  0.6  0.7   Alkaline Phos 38 - 126 U/L 90  85  87   AST 15 - 41 U/L 28  28  26    ALT 0 - 44 U/L 20  21  18     . Lab Results  Component Value Date   LDH 196 (H) 10/18/2023      RADIOGRAPHIC STUDIES: I have personally reviewed the radiological images as listed and agreed with the findings in the report. No results found.  06/24/2020 JAK2    11/05/2022 right breast lumpectomy:     ASSESSMENT & PLAN:   89 y.o. 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 S/p rt breast lumpectomy and currently on Letrozole .  PLAN:  -Discussed lab results on 10/18/2023 in detail with patient. CBC showed WBC of 8.5K, hemoglobin of 14.8, and platelets of 544K. -LDH 196 U/L -patient notes no symptoms and has no physical examination findings suggestive of breast cancer recurrence/progression at this time. -no notable toxicities  from letrozole  and shall continue this.  -patient notes several missed doses on the hydroxyurea  which might explain the bump in PLT -discussed that we shall re-evaluate need to increase hydroxyurea  dose if plt remain elevated after patient is able to take his hydroxyurea  with better compliance. -continue ASA 81mg  po daily  FOLLOW-UP: RTC with Dr Onesimo with labs in 4 months F/u with cardiology as scheduled for evaluation of palpitations.  The total time spent in the appointment was 30 minutes* .  All of the patient's questions were answered with apparent satisfaction. The patient knows to call the clinic with any problems, questions or concerns.   Cheyenne Onesimo MD MS AAHIVMS [facility] Hematology/Oncology Physician [facility]  .*Total Encounter Time as defined by the Centers for Medicare and Medicaid Services includes, in addition to the face-to-face time of a patient visit (documented in the note above) non-face-to-face time: obtaining and reviewing outside history, ordering and reviewing medications, tests or procedures, care coordination (communications with other health care professionals or caregivers) and documentation in the medical record.    I,Mitra Faeizi,acting as a Neurosurgeon for Cheyenne Onesimo, MD.,have documented all relevant documentation on the behalf of Cheyenne Onesimo, MD,as directed by  Cheyenne Onesimo, MD while in the presence of Cheyenne Onesimo, MD.  .I have reviewed the above documentation for accuracy and completeness, and I agree with the above. .Liyanna Cartwright Kishore Shashank Kwasnik MD

## 2023-10-20 ENCOUNTER — Telehealth: Payer: Self-pay | Admitting: Hematology

## 2023-10-20 NOTE — Telephone Encounter (Signed)
 Left patient a vm regarding upcoming appointment

## 2023-10-21 ENCOUNTER — Ambulatory Visit: Attending: Cardiology

## 2023-10-21 ENCOUNTER — Ambulatory Visit: Attending: Cardiology | Admitting: Cardiology

## 2023-10-21 VITALS — BP 170/70 | HR 58

## 2023-10-21 DIAGNOSIS — R002 Palpitations: Secondary | ICD-10-CM

## 2023-10-21 NOTE — Progress Notes (Unsigned)
Applied a 14 day Zio XT monitor to patient

## 2023-10-21 NOTE — Progress Notes (Addendum)
   Nurse Visit   Date of Encounter: 10/21/2023 ID: [name], DOB 09/10/1932, MRN [id number]  PCP:  [name], Richard W, MD   [facility] Providers Cardiologist:   Elmira Newman PARAS, MD    Visit Details   VS:  BP (!) 170/70 (BP Location: Left Arm, Patient Position: Sitting, Cuff Size: Small)   Pulse (!) 58   SpO2 96%  , BMI There is no height or weight on file to calculate BMI.  Wt Readings from Last 3 Encounters:  10/18/23 98 lb 3.2 oz (44.5 kg)  06/01/23 96 lb 6.4 oz (43.7 kg)  04/27/23 93 lb 6.4 oz (42.4 kg)     Reason for visit: Palpitations Performed today: Zio monitor placed, EKG, V/S, and provider- Patwardhan, Newman PARAS, MD consulted  Changes (medications, testing, etc.) : No changes at this time Length of Visit: 30 minutes    Medications Adjustments/Labs and Tests Ordered: Orders Placed This Encounter  Procedures   EKG 12-Lead   No orders of the defined types were placed in this encounter.   Signed, Comer LITTIE Ebbs, RN  10/21/2023 12:12 PM   Addendum: EKG 10/21/2023: Sinus bradycardia with occasional Premature ventricular complexes Minimal voltage criteria for LVH, may be normal variant ( Sokolow-Lyon ) When compared with ECG of 27-Apr-2023 11:51, Premature ventricular complexes are now Present Incomplete right bundle branch block is no longer Present   Agree with the monitor use

## 2023-10-21 NOTE — Patient Instructions (Signed)
 Medication Instructions:  No changes *If you need a refill on your cardiac medications before your next appointment, please call your pharmacy*  Lab Work: None ordered If you have labs (blood work) drawn today and your tests are completely normal, you will receive your results only by: MyChart Message (if you have MyChart) OR A paper copy in the mail If you have any lab test that is abnormal or we need to change your treatment, we will call you to review the results.  Follow-Up: At [facility], you and your health needs are our priority.  As part of our continuing mission to provide you with exceptional heart care, our providers are all part of one team.  This team includes your primary Cardiologist (physician) and Advanced Practice Providers or APPs (Physician Assistants and Nurse Practitioners) who all work together to provide you with the care you need, when you need it.  Your next appointment:    Follow up at already scheduled appt  Provider:   Dr Susana  We recommend signing up for the patient portal called MyChart.  Sign up information is provided on this After Visit Summary.  MyChart is used to connect with patients for Virtual Visits (Telemedicine).  Patients are able to view lab/test results, encounter notes, upcoming appointments, etc.  Non-urgent messages can be sent to your provider as well.   To learn more about what you can do with MyChart, go to [email or web address].

## 2023-10-26 DIAGNOSIS — H3562 Retinal hemorrhage, left eye: Secondary | ICD-10-CM | POA: Diagnosis not present

## 2023-10-26 DIAGNOSIS — H353221 Exudative age-related macular degeneration, left eye, with active choroidal neovascularization: Secondary | ICD-10-CM | POA: Diagnosis not present

## 2023-10-26 DIAGNOSIS — H18421 Band keratopathy, right eye: Secondary | ICD-10-CM | POA: Diagnosis not present

## 2023-10-26 DIAGNOSIS — H18231 Secondary corneal edema, right eye: Secondary | ICD-10-CM | POA: Diagnosis not present

## 2023-10-26 DIAGNOSIS — H401120 Primary open-angle glaucoma, left eye, stage unspecified: Secondary | ICD-10-CM | POA: Diagnosis not present

## 2023-11-01 ENCOUNTER — Telehealth: Payer: Self-pay | Admitting: Cardiology

## 2023-11-01 NOTE — Telephone Encounter (Signed)
  1. Is this related to a heart monitor you are wearing?  (If the patient says no, please ask     if they are caling about ICD/pacemaker.) Heart monitor  2. What is your issue?? Pt is having dental cleaning today and it is time for x-rays can she have that donewith her heart monitor on  Please route to covering RN/CMA/RMA for results. Route to monitor technicians or your monitor tech representative for your site for any technical concerns

## 2023-11-01 NOTE — Telephone Encounter (Signed)
 Spoke to pt and she wanted to make sure she could take her zio monitor off on day of echo to mail in that evening. Pt advised this is okay.

## 2023-11-01 NOTE — Telephone Encounter (Signed)
 Left message to call back

## 2023-11-04 ENCOUNTER — Ambulatory Visit (HOSPITAL_COMMUNITY)
Admission: RE | Admit: 2023-11-04 | Discharge: 2023-11-04 | Disposition: A | Discharge status: Home / Self Care | Payer: Medicare PPO | Source: Ambulatory Visit | Attending: Cardiology | Admitting: Cardiology

## 2023-11-04 DIAGNOSIS — I361 Nonrheumatic tricuspid (valve) insufficiency: Secondary | ICD-10-CM | POA: Diagnosis not present

## 2023-11-04 LAB — ECHOCARDIOGRAM COMPLETE
Area-P 1/2: 4.65 cm2
P 1/2 time: 455 ms
S' Lateral: 2.3 cm

## 2023-11-06 NOTE — Progress Notes (Unsigned)
 Cardiology Office Note:  .   Date:  11/06/2023  ID:  [name], DOB 03/10/1933, MRN [id number] PCP: Vernadine [name], MD  [facility] Providers Cardiologist:  Newman Lawrence, MD PCP: Vernadine [name], MD  No chief complaint on file.     History of Present Illness: .    [name] is a 89 y.o. 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  They have recently moved to a [place].  Patient is doing fairly well.  Leg edema is controlled when she takes torsemide .  She has not taken it today, usually avoids taking it when she is going outside.  She has occasional palpitation symptoms, roughly once a month or so.  There were no vitals filed for this visit.    ROS:  Review of Systems  Cardiovascular:  Positive for palpitations. Negative for chest pain, dyspnea on exertion, leg swelling and syncope.     Studies Reviewed: [name]        EKG 04/27/2023: Sinus rhythm with sinus arrhythmia Incomplete right bundle branch block When compared with ECG of 28-Oct-2005 14:13, Incomplete right bundle branch block is now Present     Labs 02/2023: Hb 13.6 Cr 1.35  Echocardiogram 10/2023: 1. Left ventricular ejection fraction, by estimation, is 60 to 65%. The left ventricle has normal function. The left ventricle has no regional wall motion abnormalities. There is mild concentric left ventricular hypertrophy. Left ventricular diastolic  parameters are indeterminate.  2. Right ventricular systolic function is normal. The right ventricular size is mildly enlarged. There is moderately elevated pulmonary artery systolic pressure. The estimated right ventricular systolic pressure is 58.6 mmHg.  3. Left atrial size was mildly dilated.  4. Right atrial size was severely  dilated.  5. The mitral valve is normal in structure. No evidence of mitral valve regurgitation. No evidence of mitral stenosis.  6. Tricuspid valve regurgitation is severe. Hepatic vein flow not interrogated.  7. The aortic valve is normal in structure. Aortic valve regurgitation is trivial. No aortic stenosis is present.  8. The inferior vena cava is dilated in size with <50% respiratory variability, suggesting right atrial pressure of 15 mmHg.   Physical Exam:   Physical Exam Vitals and nursing note reviewed.  Constitutional:      General: She is not in acute distress. Neck:     Vascular: No JVD.  Cardiovascular:     Rate and Rhythm: Normal rate and regular rhythm.     Heart sounds: Murmur heard.     High-pitched blowing holosystolic murmur is present at the upper left sternal border.  Pulmonary:     Effort: Pulmonary effort is normal.     Breath sounds: Normal breath sounds. No wheezing or rales.  Musculoskeletal:     Right lower leg: Edema (1+) present.     Left lower leg: Edema (1+) present.      VISIT DIAGNOSES: No diagnosis found.    ASSESSMENT AND PLAN: .    [name] is a 89 y.o. female with hypertension, CKD 3a, h/o breast cancer, moderate to severe tricuspid regurgitation, pulmonary hypertension, multinodular goiter w/h/o thyrotoxicosis, polycythemia vera   Pulmonary hypertension: Longstanding pulmonary hypertension, could either be WHO group 1 or 2.  Her history of polycythemia vera makes associated pulmonary hypertension likely.  Other than leg edema, she denies any symptoms.  Recent echocardiogram in 03/2023 shows PASP 44 mmHg, lower than before, without RV systolic dysfunction. Continue torsemide  20 mg bid.  She wants to hold of RHC, which is very reasonable at her age and relative lack of symptoms.   Hypertension: Blood pressure also low normal today.  Reduce losartan  from 100 mg daily, to 50 mg daily.  PAD: Mild PAD LLE. Continue risk factor  modification In absence of bleeding, okay to continue aspirin.   Not on statin, lipids well controlled.    F/u in 6 months with repeat echocardiogram  Signed, [name], MD

## 2023-11-09 ENCOUNTER — Encounter: Payer: Self-pay | Admitting: Cardiology

## 2023-11-09 ENCOUNTER — Ambulatory Visit: Attending: Cardiology | Admitting: Cardiology

## 2023-11-09 VITALS — BP 112/66 | HR 55 | Ht 60.0 in | Wt 98.6 lb

## 2023-11-09 DIAGNOSIS — I10 Essential (primary) hypertension: Secondary | ICD-10-CM

## 2023-11-09 DIAGNOSIS — I739 Peripheral vascular disease, unspecified: Secondary | ICD-10-CM | POA: Diagnosis not present

## 2023-11-09 DIAGNOSIS — I2729 Other secondary pulmonary hypertension: Secondary | ICD-10-CM | POA: Diagnosis not present

## 2023-11-09 DIAGNOSIS — R002 Palpitations: Secondary | ICD-10-CM | POA: Diagnosis not present

## 2023-11-09 MED ORDER — TORSEMIDE 20 MG PO TABS: 20.0000 mg | ORAL_TABLET | Freq: Every day | ORAL | Status: DC

## 2023-11-09 MED ORDER — POTASSIUM CHLORIDE CRYS ER 20 MEQ PO TBCR: 20.0000 meq | EXTENDED_RELEASE_TABLET | Freq: Every day | ORAL | Status: AC

## 2023-11-09 MED ORDER — LOSARTAN POTASSIUM 50 MG PO TABS: 50.0000 mg | ORAL_TABLET | Freq: Every day | ORAL | 3 refills | Status: AC

## 2023-11-09 NOTE — Patient Instructions (Signed)
 Medication Instructions:  CHANGE Losartan  to 50 mg daily  CHANGED Torsemide  to 1 tablet daily   *If you need a refill on your cardiac medications before your next appointment, please call your pharmacy*  Follow-Up: At [facility], you and your health needs are our priority.  As part of our continuing mission to provide you with exceptional heart care, our providers are all part of one team.  This team includes your primary Cardiologist (physician) and Advanced Practice Providers or APPs (Physician Assistants and Nurse Practitioners) who all work together to provide you with the care you need, when you need it.  Your next appointment:   6 month(s)  Provider:   [name], MD    We recommend signing up for the patient portal called MyChart.  Sign up information is provided on this After Visit Summary.  MyChart is used to connect with patients for Virtual Visits (Telemedicine).  Patients are able to view lab/test results, encounter notes, upcoming appointments, etc.  Non-urgent messages can be sent to your provider as well.   To learn more about what you can do with MyChart, go to [email or web address].

## 2023-11-14 ENCOUNTER — Ambulatory Visit: Payer: Self-pay | Admitting: Cardiology

## 2023-11-14 DIAGNOSIS — R002 Palpitations: Secondary | ICD-10-CM | POA: Diagnosis not present

## 2023-11-14 NOTE — Progress Notes (Signed)
 No atrial fibrillation seen. Extra beats and episodes of rapid heart beat likely the cause of patients symptoms. Blood thinner not necessary for this rhythm. Resting heart rate and blood pressure on lower side, therefore not adding a daily medication for these symptoms. Recommend adding diltiazem  30 mg for episodes of rapid heart beat, not improved with vagal maneuver.  Thanks MJP

## 2023-11-17 MED ORDER — DILTIAZEM HCL 30 MG PO TABS: 30.0000 mg | ORAL_TABLET | ORAL | 3 refills | Status: DC | PRN

## 2023-11-17 NOTE — Telephone Encounter (Signed)
 Follow Up:      Patient is retuning call from today, concerning her results.calling

## 2023-12-02 DIAGNOSIS — R52 Pain, unspecified: Secondary | ICD-10-CM | POA: Diagnosis not present

## 2023-12-02 DIAGNOSIS — J449 Chronic obstructive pulmonary disease, unspecified: Secondary | ICD-10-CM | POA: Diagnosis not present

## 2023-12-02 DIAGNOSIS — Z1152 Encounter for screening for COVID-19: Secondary | ICD-10-CM | POA: Diagnosis not present

## 2023-12-02 DIAGNOSIS — J189 Pneumonia, unspecified organism: Secondary | ICD-10-CM | POA: Diagnosis not present

## 2023-12-02 DIAGNOSIS — S22080A Wedge compression fracture of T11-T12 vertebra, initial encounter for closed fracture: Secondary | ICD-10-CM | POA: Diagnosis not present

## 2023-12-02 DIAGNOSIS — J069 Acute upper respiratory infection, unspecified: Secondary | ICD-10-CM | POA: Diagnosis not present

## 2023-12-02 DIAGNOSIS — R051 Acute cough: Secondary | ICD-10-CM | POA: Diagnosis not present

## 2023-12-02 DIAGNOSIS — M810 Age-related osteoporosis without current pathological fracture: Secondary | ICD-10-CM | POA: Diagnosis not present

## 2023-12-10 ENCOUNTER — Other Ambulatory Visit (HOSPITAL_COMMUNITY): Payer: Self-pay

## 2023-12-10 ENCOUNTER — Other Ambulatory Visit: Payer: Self-pay | Admitting: Hematology

## 2023-12-10 MED ORDER — HYDROXYUREA 500 MG PO CAPS
500.0000 mg | ORAL_CAPSULE | Freq: Every day | ORAL | 1 refills | Status: DC
  Filled 2023-12-10: qty 30, 30d supply, fill #0
  Filled 2024-01-04: qty 30, 30d supply, fill #1

## 2023-12-11 ENCOUNTER — Other Ambulatory Visit (HOSPITAL_COMMUNITY): Payer: Self-pay

## 2023-12-16 DIAGNOSIS — H2513 Age-related nuclear cataract, bilateral: Secondary | ICD-10-CM | POA: Diagnosis not present

## 2023-12-16 DIAGNOSIS — J189 Pneumonia, unspecified organism: Secondary | ICD-10-CM | POA: Diagnosis not present

## 2023-12-16 DIAGNOSIS — H17811 Minor opacity of cornea, right eye: Secondary | ICD-10-CM | POA: Diagnosis not present

## 2023-12-16 DIAGNOSIS — H524 Presbyopia: Secondary | ICD-10-CM | POA: Diagnosis not present

## 2023-12-16 DIAGNOSIS — H353222 Exudative age-related macular degeneration, left eye, with inactive choroidal neovascularization: Secondary | ICD-10-CM | POA: Diagnosis not present

## 2024-01-03 DIAGNOSIS — M940 Chondrocostal junction syndrome [Tietze]: Secondary | ICD-10-CM | POA: Diagnosis not present

## 2024-01-03 DIAGNOSIS — J189 Pneumonia, unspecified organism: Secondary | ICD-10-CM | POA: Diagnosis not present

## 2024-01-03 DIAGNOSIS — J449 Chronic obstructive pulmonary disease, unspecified: Secondary | ICD-10-CM | POA: Diagnosis not present

## 2024-01-03 DIAGNOSIS — M810 Age-related osteoporosis without current pathological fracture: Secondary | ICD-10-CM | POA: Diagnosis not present

## 2024-01-03 DIAGNOSIS — S22080A Wedge compression fracture of T11-T12 vertebra, initial encounter for closed fracture: Secondary | ICD-10-CM | POA: Diagnosis not present

## 2024-01-04 DIAGNOSIS — E0520 Thyrotoxicosis with toxic multinodular goiter without thyrotoxic crisis or storm: Secondary | ICD-10-CM | POA: Diagnosis not present

## 2024-01-04 DIAGNOSIS — N1831 Chronic kidney disease, stage 3a: Secondary | ICD-10-CM | POA: Diagnosis not present

## 2024-01-04 DIAGNOSIS — M810 Age-related osteoporosis without current pathological fracture: Secondary | ICD-10-CM | POA: Diagnosis not present

## 2024-01-04 DIAGNOSIS — Z0189 Encounter for other specified special examinations: Secondary | ICD-10-CM | POA: Diagnosis not present

## 2024-01-04 DIAGNOSIS — E039 Hypothyroidism, unspecified: Secondary | ICD-10-CM | POA: Diagnosis not present

## 2024-01-04 DIAGNOSIS — N810 Urethrocele: Secondary | ICD-10-CM | POA: Diagnosis not present

## 2024-01-04 DIAGNOSIS — I1310 Hypertensive heart and chronic kidney disease without heart failure, with stage 1 through stage 4 chronic kidney disease, or unspecified chronic kidney disease: Secondary | ICD-10-CM | POA: Diagnosis not present

## 2024-01-06 ENCOUNTER — Other Ambulatory Visit (HOSPITAL_COMMUNITY): Payer: Self-pay

## 2024-01-11 DIAGNOSIS — E0520 Thyrotoxicosis with toxic multinodular goiter without thyrotoxic crisis or storm: Secondary | ICD-10-CM | POA: Diagnosis not present

## 2024-01-11 DIAGNOSIS — I739 Peripheral vascular disease, unspecified: Secondary | ICD-10-CM | POA: Diagnosis not present

## 2024-01-11 DIAGNOSIS — F4310 Post-traumatic stress disorder, unspecified: Secondary | ICD-10-CM | POA: Diagnosis not present

## 2024-01-11 DIAGNOSIS — H409 Unspecified glaucoma: Secondary | ICD-10-CM | POA: Diagnosis not present

## 2024-01-11 DIAGNOSIS — D45 Polycythemia vera: Secondary | ICD-10-CM | POA: Diagnosis not present

## 2024-01-11 DIAGNOSIS — Z1339 Encounter for screening examination for other mental health and behavioral disorders: Secondary | ICD-10-CM | POA: Diagnosis not present

## 2024-01-11 DIAGNOSIS — I071 Rheumatic tricuspid insufficiency: Secondary | ICD-10-CM | POA: Diagnosis not present

## 2024-01-11 DIAGNOSIS — N1831 Chronic kidney disease, stage 3a: Secondary | ICD-10-CM | POA: Diagnosis not present

## 2024-01-11 DIAGNOSIS — Z1589 Genetic susceptibility to other disease: Secondary | ICD-10-CM | POA: Diagnosis not present

## 2024-01-11 DIAGNOSIS — R82998 Other abnormal findings in urine: Secondary | ICD-10-CM | POA: Diagnosis not present

## 2024-01-11 DIAGNOSIS — I1310 Hypertensive heart and chronic kidney disease without heart failure, with stage 1 through stage 4 chronic kidney disease, or unspecified chronic kidney disease: Secondary | ICD-10-CM | POA: Diagnosis not present

## 2024-01-11 DIAGNOSIS — Z1331 Encounter for screening for depression: Secondary | ICD-10-CM | POA: Diagnosis not present

## 2024-01-11 DIAGNOSIS — Z23 Encounter for immunization: Secondary | ICD-10-CM | POA: Diagnosis not present

## 2024-01-11 DIAGNOSIS — Z0000 Encounter for general adult medical examination without abnormal findings: Secondary | ICD-10-CM | POA: Diagnosis not present

## 2024-01-25 ENCOUNTER — Other Ambulatory Visit: Payer: Self-pay | Admitting: Hematology

## 2024-01-25 DIAGNOSIS — H18421 Band keratopathy, right eye: Secondary | ICD-10-CM | POA: Diagnosis not present

## 2024-01-25 DIAGNOSIS — H401120 Primary open-angle glaucoma, left eye, stage unspecified: Secondary | ICD-10-CM | POA: Diagnosis not present

## 2024-01-25 DIAGNOSIS — H353221 Exudative age-related macular degeneration, left eye, with active choroidal neovascularization: Secondary | ICD-10-CM | POA: Diagnosis not present

## 2024-01-25 DIAGNOSIS — H3562 Retinal hemorrhage, left eye: Secondary | ICD-10-CM | POA: Diagnosis not present

## 2024-01-25 DIAGNOSIS — H18231 Secondary corneal edema, right eye: Secondary | ICD-10-CM | POA: Diagnosis not present

## 2024-01-28 ENCOUNTER — Other Ambulatory Visit (HOSPITAL_COMMUNITY): Payer: Self-pay

## 2024-01-28 ENCOUNTER — Other Ambulatory Visit: Payer: Self-pay | Admitting: Hematology

## 2024-01-28 MED ORDER — HYDROXYUREA 500 MG PO CAPS
500.0000 mg | ORAL_CAPSULE | Freq: Every day | ORAL | 1 refills | Status: DC
  Filled 2024-01-28: qty 30, 30d supply, fill #0
  Filled 2024-02-24: qty 30, 30d supply, fill #1

## 2024-01-31 ENCOUNTER — Other Ambulatory Visit (HOSPITAL_COMMUNITY): Payer: Self-pay

## 2024-02-09 DIAGNOSIS — L97911 Non-pressure chronic ulcer of unspecified part of right lower leg limited to breakdown of skin: Secondary | ICD-10-CM | POA: Diagnosis not present

## 2024-02-09 DIAGNOSIS — I739 Peripheral vascular disease, unspecified: Secondary | ICD-10-CM | POA: Diagnosis not present

## 2024-02-09 DIAGNOSIS — I8390 Asymptomatic varicose veins of unspecified lower extremity: Secondary | ICD-10-CM | POA: Diagnosis not present

## 2024-02-13 ENCOUNTER — Other Ambulatory Visit: Payer: Self-pay | Admitting: Cardiology

## 2024-02-14 ENCOUNTER — Other Ambulatory Visit: Payer: Self-pay

## 2024-02-14 DIAGNOSIS — C50911 Malignant neoplasm of unspecified site of right female breast: Secondary | ICD-10-CM

## 2024-02-14 DIAGNOSIS — D45 Polycythemia vera: Secondary | ICD-10-CM

## 2024-02-15 ENCOUNTER — Inpatient Hospital Stay: Attending: Hematology

## 2024-02-15 ENCOUNTER — Inpatient Hospital Stay: Admitting: Hematology

## 2024-02-15 VITALS — BP 162/61 | HR 58 | Temp 97.5°F | Resp 18 | Wt 96.3 lb

## 2024-02-15 DIAGNOSIS — Z170 Estrogen receptor positive status [ER+]: Secondary | ICD-10-CM | POA: Diagnosis not present

## 2024-02-15 DIAGNOSIS — C50911 Malignant neoplasm of unspecified site of right female breast: Secondary | ICD-10-CM

## 2024-02-15 DIAGNOSIS — Z79811 Long term (current) use of aromatase inhibitors: Secondary | ICD-10-CM | POA: Insufficient documentation

## 2024-02-15 DIAGNOSIS — D45 Polycythemia vera: Secondary | ICD-10-CM | POA: Diagnosis not present

## 2024-02-15 DIAGNOSIS — M8580 Other specified disorders of bone density and structure, unspecified site: Secondary | ICD-10-CM | POA: Insufficient documentation

## 2024-02-15 DIAGNOSIS — D0511 Intraductal carcinoma in situ of right breast: Secondary | ICD-10-CM | POA: Diagnosis not present

## 2024-02-15 DIAGNOSIS — D7589 Other specified diseases of blood and blood-forming organs: Secondary | ICD-10-CM | POA: Diagnosis not present

## 2024-02-15 DIAGNOSIS — Z7982 Long term (current) use of aspirin: Secondary | ICD-10-CM | POA: Insufficient documentation

## 2024-02-15 LAB — CBC WITH DIFFERENTIAL (CANCER CENTER ONLY)
Abs Immature Granulocytes: 0.06 K/uL (ref 0.00–0.07)
Basophils Absolute: 0.2 K/uL — ABNORMAL HIGH (ref 0.0–0.1)
Basophils Relative: 2 %
Eosinophils Absolute: 0.3 K/uL (ref 0.0–0.5)
Eosinophils Relative: 2 %
HCT: 41.4 % (ref 36.0–46.0)
Hemoglobin: 14 g/dL (ref 12.0–15.0)
Immature Granulocytes: 1 %
Lymphocytes Relative: 11 %
Lymphs Abs: 1.2 K/uL (ref 0.7–4.0)
MCH: 38.7 pg — ABNORMAL HIGH (ref 26.0–34.0)
MCHC: 33.8 g/dL (ref 30.0–36.0)
MCV: 114.4 fL — ABNORMAL HIGH (ref 80.0–100.0)
Monocytes Absolute: 0.8 K/uL (ref 0.1–1.0)
Monocytes Relative: 7 %
Neutro Abs: 8 K/uL — ABNORMAL HIGH (ref 1.7–7.7)
Neutrophils Relative %: 77 %
Platelet Count: 422 K/uL — ABNORMAL HIGH (ref 150–400)
RBC: 3.62 MIL/uL — ABNORMAL LOW (ref 3.87–5.11)
RDW: 14.4 % (ref 11.5–15.5)
WBC Count: 10.4 K/uL (ref 4.0–10.5)
nRBC: 0 % (ref 0.0–0.2)

## 2024-02-15 LAB — CMP (CANCER CENTER ONLY)
ALT: 17 U/L (ref 0–44)
AST: 27 U/L (ref 15–41)
Albumin: 3.9 g/dL (ref 3.5–5.0)
Alkaline Phosphatase: 82 U/L (ref 38–126)
Anion gap: 4 — ABNORMAL LOW (ref 5–15)
BUN: 26 mg/dL — ABNORMAL HIGH (ref 8–23)
CO2: 34 mmol/L — ABNORMAL HIGH (ref 22–32)
Calcium: 9.6 mg/dL (ref 8.9–10.3)
Chloride: 103 mmol/L (ref 98–111)
Creatinine: 1.17 mg/dL — ABNORMAL HIGH (ref 0.44–1.00)
GFR, Estimated: 45 mL/min — ABNORMAL LOW (ref >60)
Glucose, Bld: 110 mg/dL — ABNORMAL HIGH (ref 70–99)
Potassium: 3.5 mmol/L (ref 3.5–5.1)
Sodium: 141 mmol/L (ref 135–145)
Total Bilirubin: 1 mg/dL (ref 0.0–1.2)
Total Protein: 6.9 g/dL (ref 6.5–8.1)

## 2024-02-15 LAB — LACTATE DEHYDROGENASE: LDH: 216 U/L — ABNORMAL HIGH (ref 98–192)

## 2024-02-15 NOTE — Progress Notes (Signed)
 HEMATOLOGY/ONCOLOGY CLINIC NOTE  Date of Service: 02/15/2024  Patient Care Team: [name], [name], MD as PCP - General (Internal Medicine) Elmira Newman PARAS, MD as PCP - Cardiology (Cardiology) Onesimo Emaline Brink, MD as Consulting Physician (Hematology) Curvin Deward MOULD, MD as Consulting Physician (General Surgery)  CHIEF COMPLAINTS/PURPOSE OF CONSULTATION:  Follow-up for continued evaluation and management of Polycythemia Vera and DCIS  HISTORY OF PRESENTING ILLNESS:  (06/24/2020) [name] is a wonderful 89 y.o. female who has been referred to us  by Dr. Charlie Reas, MD for evaluation and management of elevated Plt count. The pt reports that she is doing well overall. We are joined today by her husband, Elgin.   The pt had labwork, 06/12/2020 of CBC had a Plt of 701K.    The pt reports that this is the first time she has noticed her elevated Plt count. She notes her Plt have been increasing since 2018, but were normal. She notes they were slightly elevated in 2020 and have been the highest before when she got labs done recently last month. The pt notes that she did experience a severe injury and bruising one year ago in an accident with a wood splitter. There has not been any other recent injuries or infections. The pt denies any recent reasons for bleeding. The pt notes post-nasal drip, a minor cough, and a toxic goiter. She notes she has been the healthiest and had the least infection issues since COVID and the use of masks. The pt notes there were no blood clots with her injury from an US  on 07/06/2019, and the bruising and swelling has improved on surface level. She notes intermittent sting an pain in the leg.   The pt denies any history of smoking. She also denies any new symptoms that are acute. The pt notes she has not been feeling her old baseline, but notes no issues acutely or unexplained changes in how she has felt. The pt is eating well. The pt denies any history  of blood clots, heart attacks, strokes. The pt notes she has an appointment with a Cardiologist this week in two days. She notes a weak beat and some irregular heartbeats. She has been more aware of her heartbeats recently and has been swelling in her feet more. The pt notes she also has Pulmonary Hypertension, but was never aware of the cause of this. The pt still has her spleen. The pt notes some new crippling in her left hand and continued in her right. The pt notes some hip pain and denies any history of gout. The pt notes her last thyroid  levels were checked on 06/12/2020 with other blood counts and there were no concerns.     On review of systems, pt reports leg swelling and denies fevers, chills, sudden unexpected weight loss, SOB, chest pain, back pain, changes in bowel habits, and any other symptoms.   INTERVAL HISTORY  [name] is a 89 y.o. here for continued evaluation and management of Polycythemia Vera. Accompanied by her husband.   Patient was last seen by me on 10/18/2023 and was doing well without any complaints.  Today, she reports having dealt with Pneumonia from August to September, which was treated with Levaquin. She believes Levaquin contributed to the appearance of a minor ulcer on her right ankle, but has otherwise felt improved since then. Says that she is using compression socks for swelling management and is managing the ulcer herself using hydrocolloid dressing and Bactroban topical antibiotics.  She says that she is still tolerating her Hydroxyurea  500 mg x6 days and 1000 mg on one day; is still taking ASA 81 mg without bleeding issues, and is still taking Letrozole  2.5 daily without any new breast lumps/bumps or other symptoms - had her mammogram 09/02/2023. Additionally is taking Vitamin-D and Calcium .   States that she is up to date on all age-recommended vaccinations: Influenza, Covid-19, PNA, RSV, and Shingrix.  Notes that despite eating well and having no  abdominal pain, she has lost about 2 LBS since July.   MEDICAL HISTORY:  Past Medical History:  Diagnosis Date   Arthritis    Hypertension    Mitral valve prolapse    Osteopenia    Polycythemia vera (HCC)    Pulmonary hypertension (HCC)    Thyroid  disease    Varicose veins VENOUS INSUFFICIENCY SLOW HEALING OF WOUNDS FURMAN TO LOWER EXTEMETIES   Venous stasis dermatitis     SURGICAL HISTORY: Past Surgical History:  Procedure Laterality Date   BREAST BIOPSY Right 09/04/2022   US  RT BREAST BX W LOC DEV 1ST LESION IMG BX SPEC US  GUIDE 09/04/2022 [facility] MAMMOGRAPHY   BREAST CYST ASPIRATION Left    BREAST LUMPECTOMY Right 11/05/2022   Procedure: RIGHT BREAST LUMPECTOMY;  Surgeon: Curvin Deward MOULD, MD;  Location: [facility] OR;  Service: General;  Laterality: Right;   EYE SURGERY     LAPAROSCOPIC APPENDECTOMY N/A 08/18/2022   Procedure: APPENDECTOMY LAPAROSCOPIC;  Surgeon: Eletha Boas, MD;  Location: [facility];  Service: General;  Laterality: N/A;   TONSILLECTOMY      SOCIAL HISTORY: Social History   Socioeconomic History   Marital status: Married    Spouse name: Not on file   Number of children: 2   Years of education: Not on file   Highest education level: Not on file  Occupational History   Not on file  Tobacco Use   Smoking status: Never   Smokeless tobacco: Never  Vaping Use   Vaping status: Never Used  Substance and Sexual Activity   Alcohol  use: No   Drug use: Never   Sexual activity: Not on file  Other Topics Concern   Not on file  Social History Narrative   Not on file   Social Drivers of Health   Financial Resource Strain: Not on file  Food Insecurity: No Food Insecurity (08/18/2022)   Hunger Vital Sign    Worried About Running Out of Food in the Last Year: Never true    Ran Out of Food in the Last Year: Never true  Transportation Needs: No Transportation Needs (08/18/2022)   PRAPARE - Administrator, Civil Service (Medical): No    Lack of  Transportation (Non-Medical): No  Physical Activity: Not on file  Stress: Not on file  Social Connections: Unknown (09/02/2021)   Received from [facility]   Social Network    Social Network: Not on file  Intimate Partner Violence: Not At Risk (08/18/2022)   Humiliation, Afraid, Rape, and Kick questionnaire    Fear of Current or Ex-Partner: No    Emotionally Abused: No    Physically Abused: No    Sexually Abused: No    FAMILY HISTORY: Family History  Problem Relation Age of Onset   Heart disease Father    Cancer Son    Cancer Maternal Aunt    Cancer Paternal Uncle    Cancer Maternal Grandfather    Breast cancer Neg Hx     ALLERGIES:  is allergic  to doxycycline calcium  and penicillin v.  MEDICATIONS:  Current Outpatient Medications  Medication Sig Dispense Refill   aspirin 81 MG tablet Take 81 mg by mouth daily at 12 noon.     b complex vitamins capsule Take 1 capsule by mouth daily.     bacitracin-polymyxin b (POLYSPORIN) ointment Apply 1 Application topically 2 (two) times daily as needed (wound care).     CALCIUM -MAGNESIUM-VITAMIN D  PO Take 1 tablet by mouth daily.     cetirizine (ZYRTEC) 10 MG tablet Take 10 mg by mouth daily as needed for allergies or rhinitis.     diltiazem  (CARDIZEM ) 30 MG tablet Take 1 tablet (30 mg total) by mouth as needed. Take if episode of rapid heartbeat occurs and vagal maneuvers don't resolve issues. 30 tablet 3   diltiazem  (DILACOR XR ) 180 MG 24 hr capsule Take 180 mg by mouth in the morning.     hydroxyurea  (HYDREA ) 500 MG capsule Take 1 capsule (500 mg total) by mouth daily. May take with food to minimize GI side effects. 30 capsule 1   letrozole  (FEMARA ) 2.5 MG tablet TAKE 1 TABLET BY MOUTH DAILY 30 tablet 5   losartan  (COZAAR ) 50 MG tablet Take 1 tablet (50 mg total) by mouth daily. 90 tablet 3   methimazole  (TAPAZOLE ) 5 MG tablet Take 5 mg by mouth in the morning.     mupirocin cream (BACTROBAN) 2 % Apply 1 Application topically 2 (two)  times daily.     potassium chloride  SA (KLOR-CON  M) 20 MEQ tablet Take 1 tablet (20 mEq total) by mouth daily.     Psyllium (METAMUCIL PO) Take 1 Scoop by mouth daily.     timolol  (TIMOPTIC ) 0.5 % ophthalmic solution Place 1 drop into both eyes in the morning.     torsemide  (DEMADEX ) 20 MG tablet Take 1 tablet (20 mg total) by mouth daily.     No current facility-administered medications for this visit.    REVIEW OF SYSTEMS:    10 Point review of Systems was done is negative except as noted above.   PHYSICAL EXAMINATION: ECOG PERFORMANCE STATUS: 2  . Vitals:   02/15/24 1033 02/15/24 1040  BP: (!) 172/53 (!) 162/61  Pulse: (!) 58   Resp: 18   Temp: (!) 97.5 F (36.4 C)   SpO2: 96%    Filed Weights   02/15/24 1033  Weight: 96 lb 4.8 oz (43.7 kg)    Body mass index is 18.81 kg/m.   GENERAL:alert, in no acute distress and comfortable SKIN: no acute rashes, no significant lesions EYES: conjunctiva are pink and non-injected, sclera anicteric OROPHARYNX: MMM, no exudates, no oropharyngeal erythema or ulceration NECK: supple, no JVD LYMPH:  no palpable lymphadenopathy in the cervical, axillary or inguinal regions LUNGS: clear to auscultation b/l with normal respiratory effort HEART: regular rate & rhythm ABDOMEN:  normoactive bowel sounds , non tender, not distended. No palpable hepatosplenomegaly. Extremity: no pedal edema PSYCH: alert & oriented x 3 with fluent speech NEURO: no focal motor/sensory deficits   LABORATORY DATA:  I have reviewed the data as listed     Latest Ref Rng & Units 02/15/2024    9:46 AM 10/18/2023    9:40 AM 06/01/2023   11:56 AM  CBC EXTENDED  WBC 4.0 - 10.5 K/uL 10.4  8.5  9.3   RBC 3.87 - 5.11 MIL/uL 3.62  4.00  3.46   Hemoglobin 12.0 - 15.0 g/dL 85.9  85.1  86.5   HCT 36.0 - 46.0 %  41.4  44.8  40.7   Platelets 150 - 400 K/uL 422  544  482   NEUT# 1.7 - 7.7 K/uL 8.0  6.3  6.8   Lymph# 0.7 - 4.0 K/uL 1.2  1.2  1.4       Latest Ref Rng  & Units 02/15/2024    9:46 AM 10/18/2023    9:40 AM 06/01/2023   11:56 AM  CMP  Glucose 70 - 99 mg/dL 889  88  881   BUN 8 - 23 mg/dL 26  23  21    Creatinine 0.44 - 1.00 mg/dL 8.82  8.95  8.89   Sodium 135 - 145 mmol/L 141  143  141   Potassium 3.5 - 5.1 mmol/L 3.5  3.9  3.6   Chloride 98 - 111 mmol/L 103  104  101   CO2 22 - 32 mmol/L 34  34  35   Calcium  8.9 - 10.3 mg/dL 9.6  89.8  9.6   Total Protein 6.5 - 8.1 g/dL 6.9  7.3  7.0   Total Bilirubin 0.0 - 1.2 mg/dL 1.0  0.7  0.6   Alkaline Phos 38 - 126 U/L 82  90  85   AST 15 - 41 U/L 27  28  28    ALT 0 - 44 U/L 17  20  21     Lab Results  Component Value Date   LDH 216 (H) 02/15/2024      RADIOGRAPHIC STUDIES: I have personally reviewed the radiological images as listed and agreed with the findings in the report. No results found.  06/24/2020 JAK2    11/05/2022 right breast lumpectomy:     ASSESSMENT & PLAN:   89 y.o. 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 S/p rt breast lumpectomy and currently on Letrozole .  PLAN: - Discussed lab results on 02/15/2024 in detail with patient: CBC showed WBC of 10.4K, Hemoglobin of 14.0, RBC 3.62 decreased 4, HCT 41%, and PLTs of 422K decreased from 544K.  Continue ASA 81mg  po daily and current dose of Hydroxyurea .  CMP with Creatinine 1.17 increased from 1.04.  Independently reviewed LDH: 216 elevated from 196.  -No noted symptoms and no physical examination findings suggestive of breast cancer recurrence/progression at this time. MMG 09/02/2023- no evidence of malignancy  No notable toxicities from letrozole  and shall continue this.   - For mx of leg ulcer, suggested applying a warm compress over bandage to encourage blood flow that may increase healing   Informed her that Hydroxyurea  could delay  wound healing.  FOLLOW-UP: RTC with Dr Onesimo with labs in 4 months  The total time spent in the appointment was 30 minutes* .  All of the patient's questions were answered with apparent satisfaction. The patient knows to call the clinic with any problems, questions or concerns.   Emaline Onesimo MD MS AAHIVMS [facility] Hematology/Oncology Physician [facility]  .*Total Encounter Time as defined by the Centers for Medicare and Medicaid Services includes, in addition to the face-to-face time of a patient visit (documented in the note above) non-face-to-face time: obtaining and reviewing outside history, ordering and reviewing medications, tests or procedures, care coordination (communications with other health care professionals or caregivers) and documentation in the medical record.    LILLETTE Damien Aimee lennis as a neurosurgeon for Marqui Formby  Onesimo, MD.,have documented all relevant documentation on the behalf of Emaline Onesimo, MD,as directed by  Emaline Onesimo, MD while in the presence of Emaline Onesimo, MD.  I have reviewed the above documentation for accuracy and completeness, and I agree with the above. Emaline Candida Onesimo MD.

## 2024-03-21 ENCOUNTER — Other Ambulatory Visit (HOSPITAL_COMMUNITY): Payer: Self-pay

## 2024-03-21 ENCOUNTER — Other Ambulatory Visit: Payer: Self-pay | Admitting: Hematology

## 2024-03-21 MED ORDER — HYDROXYUREA 500 MG PO CAPS
500.0000 mg | ORAL_CAPSULE | Freq: Every day | ORAL | 1 refills | Status: DC
  Filled 2024-03-21: qty 30, 30d supply, fill #0
  Filled 2024-04-15: qty 30, 30d supply, fill #1

## 2024-04-21 ENCOUNTER — Other Ambulatory Visit (HOSPITAL_COMMUNITY): Payer: Self-pay

## 2024-05-17 ENCOUNTER — Other Ambulatory Visit: Payer: Self-pay | Admitting: Hematology

## 2024-05-17 ENCOUNTER — Other Ambulatory Visit (HOSPITAL_COMMUNITY): Payer: Self-pay

## 2024-05-17 MED ORDER — HYDROXYUREA 500 MG PO CAPS
500.0000 mg | ORAL_CAPSULE | Freq: Every day | ORAL | 1 refills | Status: AC
  Filled 2024-05-17: qty 30, 30d supply, fill #0

## 2024-05-19 ENCOUNTER — Encounter: Payer: Self-pay | Admitting: Cardiology

## 2024-05-19 ENCOUNTER — Other Ambulatory Visit (HOSPITAL_COMMUNITY): Payer: Self-pay

## 2024-05-19 ENCOUNTER — Ambulatory Visit (HOSPITAL_COMMUNITY)

## 2024-05-19 ENCOUNTER — Ambulatory Visit: Attending: Cardiology | Admitting: Cardiology

## 2024-05-19 VITALS — BP 180/69 | HR 58 | Ht 60.0 in | Wt 100.9 lb

## 2024-05-19 DIAGNOSIS — I739 Peripheral vascular disease, unspecified: Secondary | ICD-10-CM

## 2024-05-19 DIAGNOSIS — I2729 Other secondary pulmonary hypertension: Secondary | ICD-10-CM | POA: Diagnosis not present

## 2024-05-19 DIAGNOSIS — I10 Essential (primary) hypertension: Secondary | ICD-10-CM | POA: Diagnosis not present

## 2024-05-19 DIAGNOSIS — R600 Localized edema: Secondary | ICD-10-CM | POA: Insufficient documentation

## 2024-05-19 DIAGNOSIS — I361 Nonrheumatic tricuspid (valve) insufficiency: Secondary | ICD-10-CM

## 2024-05-19 DIAGNOSIS — L97921 Non-pressure chronic ulcer of unspecified part of left lower leg limited to breakdown of skin: Secondary | ICD-10-CM | POA: Diagnosis not present

## 2024-05-19 MED ORDER — TORSEMIDE 20 MG PO TABS
ORAL_TABLET | ORAL | 3 refills | Status: AC
  Filled 2024-05-19: qty 135, 67d supply, fill #0

## 2024-05-19 NOTE — Patient Instructions (Signed)
 Medication Instructions:  Increase torsemide  to take a extra dose at night as needed   *If you need a refill on your cardiac medications before your next appointment, please call your pharmacy*  Lab Work: BMP PROBNP  If you have labs (blood work) drawn today and your tests are completely normal, you will receive your results only by: MyChart Message (if you have MyChart) OR A paper copy in the mail If you have any lab test that is abnormal or we need to change your treatment, we will call you to review the results.  Testing/Procedures: ECHOCARDIOGRAM IN 09/2024  Your physician has requested that you have an echocardiogram. Echocardiography is a painless test that uses sound waves to create images of your heart. It provides your doctor with information about the size and shape of your heart and how well your hearts chambers and valves are working. This procedure takes approximately one hour. There are no restrictions for this procedure. Please do NOT wear cologne, perfume, aftershave, or lotions (deodorant is allowed). Please arrive 15 minutes prior to your appointment time.  Please note: We ask at that you not bring children with you during ultrasound (echo/ vascular) testing. Due to room size and safety concerns, children are not allowed in the ultrasound rooms during exams. Our front office staff cannot provide observation of children in our lobby area while testing is being conducted. An adult accompanying a patient to their appointment will only be allowed in the ultrasound room at the discretion of the ultrasound technician under special circumstances. We apologize for any inconvenience.  LEA WITH ABI  Your physician has requested that you have a lower or upper extremity arterial duplex. This test is an ultrasound of the arteries in the legs or arms. It looks at arterial blood flow in the legs and arms. Allow one hour for Lower and Upper Arterial scans. There are no restrictions or  special instructions.  Please note: We ask at that you not bring children with you during ultrasound (echo/ vascular) testing. Due to room size and safety concerns, children are not allowed in the ultrasound rooms during exams. Our front office staff cannot provide observation of children in our lobby area while testing is being conducted. An adult accompanying a patient to their appointment will only be allowed in the ultrasound room at the discretion of the ultrasound technician under special circumstances. We apologize for any inconvenience.  DVT STUDY  Your physician has requested that you have a lower or upper extremity venous duplex. This test is an ultrasound of the veins in the legs or arms. It looks at venous blood flow that carries blood from the heart to the legs or arms. Allow one hour for a Lower Venous exam. Allow thirty minutes for an Upper Venous exam. There are no restrictions or special instructions.  Please note: We ask at that you not bring children with you during ultrasound (echo/ vascular) testing. Due to room size and safety concerns, children are not allowed in the ultrasound rooms during exams. Our front office staff cannot provide observation of children in our lobby area while testing is being conducted. An adult accompanying a patient to their appointment will only be allowed in the ultrasound room at the discretion of the ultrasound technician under special circumstances. We apologize for any inconvenience.    Follow-Up: At [facility], you and your health needs are our priority.  As part of our continuing mission to provide you with exceptional heart care, our providers  are all part of one team.  This team includes your primary Cardiologist (physician) and Advanced Practice Providers or APPs (Physician Assistants and Nurse Practitioners) who all work together to provide you with the care you need, when you need it.  Your next appointment:   09/2024 AFTER  ECHOCARDIOGRAM   Provider:   [name], MD    We recommend signing up for the patient portal called MyChart.  Sign up information is provided on this After Visit Summary.  MyChart is used to connect with patients for Virtual Visits (Telemedicine).  Patients are able to view lab/test results, encounter notes, upcoming appointments, etc.  Non-urgent messages can be sent to your provider as well.   To learn more about what you can do with MyChart, go to [email or web address].   Other Instructions

## 2024-05-19 NOTE — Progress Notes (Signed)
 " Cardiology Office Note:  .   Date:  05/19/2024  ID:  [name], DOB 1932-09-15, MRN [id number] PCP: Vernadine [name], MD  [facility] Providers Cardiologist:  Newman Lawrence, MD PCP: Vernadine [name], MD  Chief Complaint  Patient presents with   Pulmonary hypertension      History of Present Illness: .    [name] is a 89 y.o. female with hypertension, CKD 3a, h/o breast cancer, moderate to severe tricuspid regurgitation, pulmonary hypertension, multinodular goiter w/h/o thyrotoxicosis, polycythemia vera   Patient is here with her husband today.  They are now at friend's home.  She is still getting used to new place, food etc.  She walks long ways from her room to the dining hall couple times a day without any chest pain, shortness of breath.  Leg swelling persists.  She has not taken her torsemide  this morning.  She only takes it once a day to avoid having to go to the bathroom multiple times a day.  She did have an injury to medial side of her right leg just above the ankle, but seems to be healing well with regular dressings.  As always, blood pressure is elevated in the office, but is always lower at home.  She gets it checked with the nurse at friend[facility] home regularly and is usually in 130s/80s.   Vitals:   05/19/24 0914  BP: (!) 180/69  Pulse: (!) 58  SpO2: 95%      ROS:  Review of Systems  Cardiovascular:  Positive for leg swelling. Negative for chest pain, dyspnea on exertion, palpitations and syncope.     Studies Reviewed: SABRA        EKG 04/27/2023: Sinus rhythm with sinus arrhythmia Incomplete right bundle branch block When compared with ECG of 28-Oct-2005 14:13, Incomplete right bundle branch block is now Present    Zio patch monitor 13 days 10/21/2023 - 11/04/2023: Dominant rhythm: Sinus. HR 46-86 bpm. Avg HR 62 bpm, in sinus rhythm. 54 episodes of SVT/atrial tachycardia, fastest at 185 bpm for 5 beats, longest for 12.1 secs at 131  bpm. <1% isolated SVE, couplet/triplets. 0 episodes of VT. 1.2% isolated VE, no couplet/triplets. No atrial fibrillation/atrial flutter/VT/high grade AV block, sinus pause >3sec noted. 9 patient triggered events, correlated with SVE, VE.  Echocardiogram 10/2023: 1. Left ventricular ejection fraction, by estimation, is 60 to 65%. The left ventricle has normal function. The left ventricle has no regional wall motion abnormalities. There is mild concentric left ventricular hypertrophy. Left ventricular diastolic  parameters are indeterminate.  2. Right ventricular systolic function is normal. The right ventricular size is mildly enlarged. There is moderately elevated pulmonary artery systolic pressure. The estimated right ventricular systolic pressure is 58.6 mmHg.  3. Left atrial size was mildly dilated.  4. Right atrial size was severely dilated.  5. The mitral valve is normal in structure. No evidence of mitral valve regurgitation. No evidence of mitral stenosis.  6. Tricuspid valve regurgitation is severe. Hepatic vein flow not interrogated.  7. The aortic valve is normal in structure. Aortic valve regurgitation is trivial. No aortic stenosis is present.  8. The inferior vena cava is dilated in size with <50% respiratory variability, suggesting right atrial pressure of 15 mmHg.  Labs 02/2023: Hb 13.6 Cr 1.35  Physical Exam:   Physical Exam Vitals and nursing note reviewed.  Constitutional:      General: She is not in acute distress. Neck:     Vascular: No  JVD.  Cardiovascular:     Rate and Rhythm: Normal rate and regular rhythm.     Heart sounds: Murmur heard.     High-pitched blowing holosystolic murmur is present at the upper left sternal border.  Pulmonary:     Effort: Pulmonary effort is normal.     Breath sounds: Normal breath sounds. No wheezing or rales.  Musculoskeletal:     Right lower leg: Edema (1+) present.     Left lower leg: Edema (1+) present.  Skin:     Comments: Healing ulcer medial aspect of right leg       VISIT DIAGNOSES:   ICD-10-CM   1. Essential hypertension  I10 ECHOCARDIOGRAM COMPLETE    torsemide  (DEMADEX ) 20 MG tablet    2. Nonrheumatic tricuspid valve regurgitation  I36.1 ECHOCARDIOGRAM COMPLETE    3. Leg edema  R60.0 Basic metabolic panel with GFR    Pro b natriuretic peptide (BNP)    VAS US  LOWER EXTREMITY VENOUS REFLUX    CANCELED: VAS US  LOWER EXTREMITY VENOUS (DVT)    4. PAD (peripheral artery disease)  I73.9 VAS US  ABI WITH/WO TBI    VAS US  LOWER EXTREMITY ARTERIAL DUPLEX    5. Other secondary pulmonary hypertension (HCC)  I27.29 torsemide  (DEMADEX ) 20 MG tablet    6. Ulcer of left lower extremity, limited to breakdown of skin [facility])  L97.921          ASSESSMENT AND PLAN: .    [name] is a 89 y.o. female with hypertension, CKD 3a, h/o breast cancer, moderate to severe tricuspid regurgitation, pulmonary hypertension, multinodular goiter w/h/o thyrotoxicosis, polycythemia vera   Pulmonary hypertension: Longstanding pulmonary hypertension.  Given her biatrial dilatation, most likely group 2 pulmonary hypertension.  Only symptoms are leg edema, relatively well-controlled with torsemide  and compression stockings. Emphasize importance of taking torsemide  20 mg every morning, as well as additional dose of 20 mg around noon should leg edema persist. Repeat echocardiogram in 6 months.  Ulcer: Ulcer on medial aspect of right leg, now healing well.  Risk factors include known PAD, as well as possible venous insufficiency. Will obtain lower extremity arterial ultrasound, as well as venous insufficiency study. In future, if she has recurrent nonhealing ulcer, may need to consider referral to vascular surgery.  Palpitations: Currently absent.  Episodes of PSVT noted, no atrial fibrillation noted on cardiac monitor and 10/2023.   Hypertension: Strong component of whitecoat hypertension, therefore no changes  made today to her current antihypertensive therapy.  PAD: Will obtain lower extremity arterial ultrasound.   Continue aspirin 81 mg daily.   Not on statin, but lipids well controlled.    F/u in 6 months   Signed, [name], MD   "

## 2024-05-23 ENCOUNTER — Telehealth: Payer: Self-pay | Admitting: Cardiology

## 2024-05-23 NOTE — Telephone Encounter (Signed)
 Patient canceled ABI W/WO TB test due to venous ulcer and stated can reschedule if absolutely needed.

## 2024-05-23 NOTE — Telephone Encounter (Signed)
 Unable to leave message, will attempt at later time.

## 2024-05-23 NOTE — Telephone Encounter (Signed)
 Spoke to patient she stated she is scheduled to have to have lower ext arterial dopplers 2/6.Stated she does not want to have ABI's done until ulcer heals.Advised I will make Dr.Patwardhan aware.

## 2024-05-23 NOTE — Telephone Encounter (Signed)
 Noted. That said, if ulcer does not heal, poor circulation could be underlying cause, and ABI would help with the diagnosis. Please keep up posted if the ulcer does not heal in next 1 month.  Thanks MJP

## 2024-05-25 ENCOUNTER — Ambulatory Visit (HOSPITAL_COMMUNITY): Admission: RE | Admit: 2024-05-25 | Discharge: 2024-05-25 | Attending: Cardiology

## 2024-05-25 ENCOUNTER — Ambulatory Visit (HOSPITAL_COMMUNITY)

## 2024-05-25 DIAGNOSIS — R600 Localized edema: Secondary | ICD-10-CM

## 2024-05-26 ENCOUNTER — Ambulatory Visit: Payer: Self-pay | Admitting: Cardiology

## 2024-05-26 ENCOUNTER — Ambulatory Visit (HOSPITAL_COMMUNITY)

## 2024-05-26 LAB — BASIC METABOLIC PANEL WITH GFR
BUN/Creatinine Ratio: 26 (ref 12–28)
BUN: 27 mg/dL (ref 8–27)
CO2: 28 mmol/L (ref 20–29)
Calcium: 9.8 mg/dL (ref 8.7–10.3)
Chloride: 100 mmol/L (ref 96–106)
Creatinine, Ser: 1.02 mg/dL — ABNORMAL HIGH (ref 0.57–1.00)
Glucose: 108 mg/dL — ABNORMAL HIGH (ref 70–99)
Potassium: 4.9 mmol/L (ref 3.5–5.2)
Sodium: 134 mmol/L (ref 134–144)
eGFR: 53 mL/min/{1.73_m2} — ABNORMAL LOW

## 2024-05-26 LAB — PRO B NATRIURETIC PEPTIDE: NT-Pro BNP: 812 pg/mL — ABNORMAL HIGH (ref 0–738)

## 2024-05-26 NOTE — Progress Notes (Signed)
 Marker of congestion only mildly elevated.  Continue current dose of diuretic medication.  Thanks MJP

## 2024-06-13 ENCOUNTER — Inpatient Hospital Stay

## 2024-06-13 ENCOUNTER — Inpatient Hospital Stay: Admitting: Hematology

## 2024-09-18 ENCOUNTER — Ambulatory Visit (HOSPITAL_COMMUNITY)

## 2024-09-26 ENCOUNTER — Ambulatory Visit: Admitting: Cardiology
